# Patient Record
Sex: Male | Born: 1955 | Race: White | Hispanic: No | Marital: Married | Smoking: Former smoker
Health system: Southern US, Community
[De-identification: ages and names within clinical notes are randomized; demographics above are authoritative.]

## PROBLEM LIST (undated history)

## (undated) DIAGNOSIS — H409 Unspecified glaucoma: Secondary | ICD-10-CM

## (undated) DIAGNOSIS — E119 Type 2 diabetes mellitus without complications: Secondary | ICD-10-CM

## (undated) DIAGNOSIS — R202 Paresthesia of skin: Secondary | ICD-10-CM

## (undated) DIAGNOSIS — G4730 Sleep apnea, unspecified: Secondary | ICD-10-CM

## (undated) DIAGNOSIS — E785 Hyperlipidemia, unspecified: Secondary | ICD-10-CM

## (undated) DIAGNOSIS — K219 Gastro-esophageal reflux disease without esophagitis: Secondary | ICD-10-CM

## (undated) DIAGNOSIS — C3490 Malignant neoplasm of unspecified part of unspecified bronchus or lung: Secondary | ICD-10-CM

## (undated) DIAGNOSIS — M2550 Pain in unspecified joint: Secondary | ICD-10-CM

## (undated) DIAGNOSIS — R200 Anesthesia of skin: Secondary | ICD-10-CM

## (undated) DIAGNOSIS — G629 Polyneuropathy, unspecified: Secondary | ICD-10-CM

## (undated) DIAGNOSIS — N289 Disorder of kidney and ureter, unspecified: Secondary | ICD-10-CM

## (undated) DIAGNOSIS — R413 Other amnesia: Secondary | ICD-10-CM

## (undated) DIAGNOSIS — M549 Dorsalgia, unspecified: Secondary | ICD-10-CM

## (undated) DIAGNOSIS — M7989 Other specified soft tissue disorders: Secondary | ICD-10-CM

## (undated) DIAGNOSIS — F32A Depression, unspecified: Secondary | ICD-10-CM

## (undated) DIAGNOSIS — F419 Anxiety disorder, unspecified: Secondary | ICD-10-CM

## (undated) HISTORY — DX: Other amnesia: R41.3

## (undated) HISTORY — DX: Anesthesia of skin: R20.2

## (undated) HISTORY — PX: CARPAL TUNNEL RELEASE: SHX101

## (undated) HISTORY — DX: Depression, unspecified: F32.A

## (undated) HISTORY — DX: Hyperlipidemia, unspecified: E78.5

## (undated) HISTORY — DX: Sleep apnea, unspecified: G47.30

## (undated) HISTORY — DX: Type 2 diabetes mellitus without complications: E11.9

## (undated) HISTORY — DX: Anesthesia of skin: R20.0

## (undated) HISTORY — DX: Malignant neoplasm of unspecified part of unspecified bronchus or lung: C34.90

## (undated) HISTORY — DX: Polyneuropathy, unspecified: G62.9

## (undated) HISTORY — DX: Unspecified glaucoma: H40.9

## (undated) HISTORY — DX: Anxiety disorder, unspecified: F41.9

## (undated) HISTORY — DX: Dorsalgia, unspecified: M54.9

## (undated) HISTORY — DX: Disorder of kidney and ureter, unspecified: N28.9

## (undated) HISTORY — PX: TONSILLECTOMY: SUR1361

## (undated) HISTORY — DX: Other specified soft tissue disorders: M79.89

## (undated) HISTORY — DX: Gastro-esophageal reflux disease without esophagitis: K21.9

## (undated) HISTORY — DX: Pain in unspecified joint: M25.50

---

## 1999-01-09 ENCOUNTER — Emergency Department (HOSPITAL_COMMUNITY): Admission: EM | Admit: 1999-01-09 | Discharge: 1999-01-09 | Payer: Self-pay | Admitting: Emergency Medicine

## 1999-01-09 ENCOUNTER — Encounter: Payer: Self-pay | Admitting: Emergency Medicine

## 2003-03-21 ENCOUNTER — Emergency Department (HOSPITAL_COMMUNITY): Admission: EM | Admit: 2003-03-21 | Discharge: 2003-03-21 | Payer: Self-pay | Admitting: Emergency Medicine

## 2005-04-23 ENCOUNTER — Ambulatory Visit: Payer: Self-pay | Admitting: Family Medicine

## 2005-09-20 ENCOUNTER — Ambulatory Visit: Payer: Self-pay | Admitting: Family Medicine

## 2005-09-27 ENCOUNTER — Ambulatory Visit: Payer: Self-pay | Admitting: Family Medicine

## 2006-03-04 ENCOUNTER — Ambulatory Visit: Payer: Self-pay | Admitting: Family Medicine

## 2011-09-17 DIAGNOSIS — Z902 Acquired absence of lung [part of]: Secondary | ICD-10-CM

## 2011-09-17 HISTORY — PX: LUNG LOBECTOMY: SHX167

## 2011-09-17 HISTORY — DX: Acquired absence of lung (part of): Z90.2

## 2013-02-17 DIAGNOSIS — E119 Type 2 diabetes mellitus without complications: Secondary | ICD-10-CM | POA: Insufficient documentation

## 2013-02-17 DIAGNOSIS — E785 Hyperlipidemia, unspecified: Secondary | ICD-10-CM | POA: Insufficient documentation

## 2019-07-05 DIAGNOSIS — K30 Functional dyspepsia: Secondary | ICD-10-CM | POA: Insufficient documentation

## 2019-07-05 DIAGNOSIS — N183 Chronic kidney disease, stage 3 unspecified: Secondary | ICD-10-CM | POA: Insufficient documentation

## 2019-07-05 DIAGNOSIS — F320 Major depressive disorder, single episode, mild: Secondary | ICD-10-CM | POA: Insufficient documentation

## 2019-07-05 DIAGNOSIS — E1142 Type 2 diabetes mellitus with diabetic polyneuropathy: Secondary | ICD-10-CM | POA: Insufficient documentation

## 2019-07-05 DIAGNOSIS — E1121 Type 2 diabetes mellitus with diabetic nephropathy: Secondary | ICD-10-CM | POA: Insufficient documentation

## 2019-07-05 DIAGNOSIS — B002 Herpesviral gingivostomatitis and pharyngotonsillitis: Secondary | ICD-10-CM | POA: Insufficient documentation

## 2019-07-05 DIAGNOSIS — N529 Male erectile dysfunction, unspecified: Secondary | ICD-10-CM | POA: Insufficient documentation

## 2019-07-05 DIAGNOSIS — N401 Enlarged prostate with lower urinary tract symptoms: Secondary | ICD-10-CM | POA: Insufficient documentation

## 2019-09-17 DIAGNOSIS — G20A1 Parkinson's disease without dyskinesia, without mention of fluctuations: Secondary | ICD-10-CM

## 2019-09-17 HISTORY — DX: Parkinson's disease without dyskinesia, without mention of fluctuations: G20.A1

## 2019-11-12 LAB — HEMOGLOBIN A1C: Hemoglobin A1C: 6.7

## 2019-12-01 ENCOUNTER — Encounter: Payer: Self-pay | Admitting: Family Medicine

## 2019-12-01 ENCOUNTER — Other Ambulatory Visit: Payer: Self-pay

## 2019-12-01 ENCOUNTER — Ambulatory Visit (INDEPENDENT_AMBULATORY_CARE_PROVIDER_SITE_OTHER): Payer: 59 | Admitting: Family Medicine

## 2019-12-01 VITALS — BP 119/71 | HR 83 | Ht 67.0 in | Wt 235.0 lb

## 2019-12-01 DIAGNOSIS — E1142 Type 2 diabetes mellitus with diabetic polyneuropathy: Secondary | ICD-10-CM

## 2019-12-01 DIAGNOSIS — G3184 Mild cognitive impairment, so stated: Secondary | ICD-10-CM

## 2019-12-01 DIAGNOSIS — E1121 Type 2 diabetes mellitus with diabetic nephropathy: Secondary | ICD-10-CM | POA: Diagnosis not present

## 2019-12-01 DIAGNOSIS — N184 Chronic kidney disease, stage 4 (severe): Secondary | ICD-10-CM

## 2019-12-01 DIAGNOSIS — I10 Essential (primary) hypertension: Secondary | ICD-10-CM | POA: Insufficient documentation

## 2019-12-01 DIAGNOSIS — E1122 Type 2 diabetes mellitus with diabetic chronic kidney disease: Secondary | ICD-10-CM

## 2019-12-01 DIAGNOSIS — Z85118 Personal history of other malignant neoplasm of bronchus and lung: Secondary | ICD-10-CM

## 2019-12-01 DIAGNOSIS — C3491 Malignant neoplasm of unspecified part of right bronchus or lung: Secondary | ICD-10-CM

## 2019-12-01 DIAGNOSIS — Z794 Long term (current) use of insulin: Secondary | ICD-10-CM

## 2019-12-01 DIAGNOSIS — K219 Gastro-esophageal reflux disease without esophagitis: Secondary | ICD-10-CM | POA: Insufficient documentation

## 2019-12-01 DIAGNOSIS — E782 Mixed hyperlipidemia: Secondary | ICD-10-CM

## 2019-12-01 MED ORDER — NONFORMULARY OR COMPOUNDED ITEM: 0 refills | Status: DC

## 2019-12-01 NOTE — Patient Instructions (Addendum)
Great to meet you! Try using walker, especially if on uneven terrain.  Continue current medications.  I would like to see you back in about 3 months or sooner if needed.  Let me know if you are interested in 2nd opinion from a different neurologist.

## 2019-12-05 ENCOUNTER — Encounter: Payer: Self-pay | Admitting: Family Medicine

## 2019-12-05 DIAGNOSIS — C3490 Malignant neoplasm of unspecified part of unspecified bronchus or lung: Secondary | ICD-10-CM | POA: Insufficient documentation

## 2019-12-05 DIAGNOSIS — G3184 Mild cognitive impairment, so stated: Secondary | ICD-10-CM | POA: Insufficient documentation

## 2019-12-05 NOTE — Assessment & Plan Note (Signed)
Tolerating crestor and fenofibrate well.  Last LDL of 57.  Continue at current dose.

## 2019-12-05 NOTE — Progress Notes (Signed)
Randall Fisher - 64 y.o. male MRN [id number]  Date of birth: 06-Jan-1956  Subjective Chief Complaint  Patient presents with  . Establish Care    HPI Randall Fisher is a 64 y.o. male with history of HTN, T2DM, CAD, Non small cell lung cancer s/p lobectomy here today for initial visit.  Records reviewed as available through care everywhere.    -HTN/CAD:  Current medications for HTN include lisinopril, furosemide.  He is also prescribed ranexa for chronic anginal symptoms.  He had normal stress test previously.  He continues to follow with cardiology through [facility].  He denies any new or worsening symptoms including chest pain, shortness of breath, palpitations, headache or vision changes.  He is taking crestor and fenofibrate for management of lipids.  He is tolerating this well.    -NSCLC:  S/p lobectomy of RUL in 2013.  Continues to follow with pulmonology and oncology. He needs new referral to oncology due to insurance change.  He denies increased shortness of breath, cough or fatigue.   -T2DM:  Followed by endocrinology at [facility].  Last A1c of 6.7% on 11/12/2019.  Current medications include tresiba and trulicity.  He is doing well with these.  He denies episodes of hypoglycemia.   -Cognitive impairment:  He is being evaluated for neuro for cognitive impairment.  He and his wife had noted cognitive decline over the past year.  MRI from 07/2019 with a few small white matter lesions, favoring chronic microvascular disease.  He is currently taking aricept.  He has also had some frequent falls due to gait instability.  He blames this on knee pain but also feels off balance.  He does have neuropathy related to his diabetes as well.  He is considering seeing another neurologist for a 2nd opinion regarding what is going on.    ROS:  A comprehensive ROS was completed and negative except as noted per HPI  Allergies  Allergen Reactions  . Mushroom Extract Complex Swelling  . Shellfish Allergy Swelling     THROAT SWELLED/ HANDS SWELLED.    Past Medical History:  Diagnosis Date  . Diabetes mellitus without complication ([place])   . Hyperlipidemia   . Joint pain   . Leg swelling   . Numbness and tingling in both hands     Past Surgical History:  Procedure Laterality Date  . LUNG LOBECTOMY Right 2013    Social History   Socioeconomic History  . Marital status: Married    Spouse name: Not on file  . Number of children: Not on file  . Years of education: Not on file  . Highest education level: Not on file  Occupational History  . Not on file  Tobacco Use  . Smoking status: Never Smoker  . Smokeless tobacco: Never Used  Substance and Sexual Activity  . Alcohol use: Not Currently  . Drug use: Never  . Sexual activity: Yes    Partners: Female    Birth control/protection: Condom  Other Topics Concern  . Not on file  Social History Narrative  . Not on file   Social Determinants of Health   Financial Resource Strain:   . Difficulty of Paying Living Expenses:   Food Insecurity:   . Worried About Charity fundraiser in the Last Year:   . Arboriculturist in the Last Year:   Transportation Needs:   . Film/video editor (Medical):   Marland Kitchen Lack of Transportation (Non-Medical):   Physical Activity:   .  Days of Exercise per Week:   . Minutes of Exercise per Session:   Stress:   . Feeling of Stress :   Social Connections:   . Frequency of Communication with Friends and Family:   . Frequency of Social Gatherings with Friends and Family:   . Attends Religious Services:   . Active Member of Clubs or Organizations:   . Attends Archivist Meetings:   Marland Kitchen Marital Status:     Family History  Problem Relation Age of Onset  . Diabetes Mother   . Diabetes Father     Health Maintenance  Topic Date Due  . Hepatitis C Screening  11/30/2020 (Originally Dec 01, 1955)  . HEMOGLOBIN A1C  05/11/2020  . FOOT EXAM  09/16/2020  . OPHTHALMOLOGY EXAM  09/16/2020  . TETANUS/TDAP   07/16/2026  . COLONOSCOPY  08/17/2029  . INFLUENZA VACCINE  Completed  . PNEUMOCOCCAL POLYSACCHARIDE VACCINE AGE 46-64 HIGH RISK  Completed  . HIV Screening  Discontinued     ----------------------------------------------------------------------------------------------------------------------------------------------------------------------------------------------------------------- Physical Exam BP 119/71   Pulse 83   Ht 5\' 7"  (1.702 m)   Wt 235 lb (106.6 kg)   BMI 36.81 kg/m   Physical Exam Constitutional:      Appearance: Normal appearance.  HENT:     Head: Normocephalic and atraumatic.  Eyes:     General: No scleral icterus. Cardiovascular:     Rate and Rhythm: Normal rate and regular rhythm.  Pulmonary:     Effort: Pulmonary effort is normal.     Breath sounds: Normal breath sounds.  Skin:    General: Skin is warm and dry.  Neurological:     General: No focal deficit present.     Mental Status: He is alert.     Gait: Gait abnormal.  Psychiatric:        Mood and Affect: Mood normal.        Behavior: Behavior normal.     ------------------------------------------------------------------------------------------------------------------------------------------------------------------------------------------------------------------- Assessment and Plan  Hypertension Blood pressure is at goal at for age and co-morbidities.  I recommend that he continue current medications.  In addition they were instructed to follow a low sodium diet with regular exercise to help to maintain adequate control of blood pressure.    Diabetic peripheral neuropathy associated with type 2 diabetes mellitus ([place]) Currently managed with lyrica.  Having some gait instability which is likely multifactorial from his neuropathy and knee pain.  Rx for rolling walker provided.  Declines PT referral at this time.   Diabetic renal disease ([place]) Followed by nephrology, stable.    DM (diabetes  mellitus) ([place]) Per endo records this has been well controlled with recent a1c of 6.7%.  He will continue trulicity and tresiba  HLD (hyperlipidemia) Tolerating crestor and fenofibrate well.  Last LDL of 57.  Continue at current dose.    Non-small cell lung cancer ([facility]) HE is doing well at this time.  He'll continue to follow with pulmonology.  Updated referral entered to oncology.   Mild cognitive impairment Tolerating aricept well.  He'll continue to follow with neurology but may want 2nd opinion, he'll let me know.    Meds ordered this encounter  Medications  . NONFORMULARY OR COMPOUNDED ITEM    Sig: Please provide rolling walker.  Diagnosis: Gait instability    Dispense:  1 each    Refill:  0    Return in about 3 months (around 03/02/2020) for HTN/Cognitive impairment. .    This visit occurred during the SARS-CoV-2 public health emergency.  Safety protocols were in place, including screening questions prior to the visit, additional usage of staff PPE, and extensive cleaning of exam room while observing appropriate contact time as indicated for disinfecting solutions.

## 2019-12-05 NOTE — Assessment & Plan Note (Signed)
HE is doing well at this time.  He'll continue to follow with pulmonology.  Updated referral entered to oncology.

## 2019-12-05 NOTE — Assessment & Plan Note (Signed)
Followed by nephrology, stable.

## 2019-12-05 NOTE — Assessment & Plan Note (Signed)
Tolerating aricept well.  He'll continue to follow with neurology but may want 2nd opinion, he'll let me know.

## 2019-12-05 NOTE — Assessment & Plan Note (Signed)
Blood pressure is at goal at for age and co-morbidities.  I recommend that he continue current medications.  In addition they were instructed to follow a low sodium diet with regular exercise to help to maintain adequate control of blood pressure.

## 2019-12-05 NOTE — Assessment & Plan Note (Signed)
Per endo records this has been well controlled with recent a1c of 6.7%.  He will continue trulicity and tresiba

## 2019-12-05 NOTE — Assessment & Plan Note (Signed)
Currently managed with lyrica.  Having some gait instability which is likely multifactorial from his neuropathy and knee pain.  Rx for rolling walker provided.  Declines PT referral at this time.

## 2019-12-07 ENCOUNTER — Encounter: Payer: Self-pay | Admitting: Family Medicine

## 2019-12-07 ENCOUNTER — Telehealth (INDEPENDENT_AMBULATORY_CARE_PROVIDER_SITE_OTHER): Payer: 59 | Admitting: Family Medicine

## 2019-12-07 DIAGNOSIS — J440 Chronic obstructive pulmonary disease with acute lower respiratory infection: Secondary | ICD-10-CM

## 2019-12-07 DIAGNOSIS — J209 Acute bronchitis, unspecified: Secondary | ICD-10-CM | POA: Diagnosis not present

## 2019-12-07 MED ORDER — DONEPEZIL HCL 5 MG PO TABS: 5.0000 mg | ORAL_TABLET | Freq: Every day | ORAL | 1 refills | Status: DC

## 2019-12-07 MED ORDER — ALBUTEROL SULFATE (2.5 MG/3ML) 0.083% IN NEBU: 2.5000 mg | INHALATION_SOLUTION | Freq: Four times a day (QID) | RESPIRATORY_TRACT | 1 refills | Status: DC | PRN

## 2019-12-07 MED ORDER — PREDNISONE 20 MG PO TABS: 20.0000 mg | ORAL_TABLET | Freq: Two times a day (BID) | ORAL | 0 refills | Status: DC

## 2019-12-07 MED ORDER — LISINOPRIL 2.5 MG PO TABS: 2.5000 mg | ORAL_TABLET | Freq: Every day | ORAL | 1 refills | Status: DC

## 2019-12-07 MED ORDER — OMEPRAZOLE 20 MG PO CPDR: 20.0000 mg | DELAYED_RELEASE_CAPSULE | Freq: Every day | ORAL | 1 refills | Status: DC

## 2019-12-07 MED ORDER — FUROSEMIDE 20 MG PO TABS: 20.0000 mg | ORAL_TABLET | Freq: Every day | ORAL | 1 refills | Status: DC

## 2019-12-07 MED ORDER — TRULANCE 3 MG PO TABS: 1.0000 | ORAL_TABLET | Freq: Every day | ORAL | 1 refills | Status: DC

## 2019-12-07 MED ORDER — FENOFIBRATE 160 MG PO TABS: 160.0000 mg | ORAL_TABLET | Freq: Every day | ORAL | 1 refills | Status: DC

## 2019-12-07 MED ORDER — DOXYCYCLINE HYCLATE 100 MG PO TABS: 100.0000 mg | ORAL_TABLET | Freq: Two times a day (BID) | ORAL | 0 refills | Status: AC

## 2019-12-07 MED ORDER — ROSUVASTATIN CALCIUM 10 MG PO TABS: 10.0000 mg | ORAL_TABLET | Freq: Every day | ORAL | 1 refills | Status: DC

## 2019-12-07 NOTE — Progress Notes (Signed)
Patient thinks he has bronchitis and is not feeling well.   He reports no fever or sore throat.   He reports the symptoms started Sunday night. He reports that he was just not feeling well.   He reports brown to green in color that coming up. He reports some shortness of breath but no more than normal  He has only taken Mucinex and his nebulizer. The nebulizer helps some.  He wants a refill on his Albuterol solution.

## 2019-12-07 NOTE — Assessment & Plan Note (Signed)
Declines COVID testing at this time.  Discussed that if symptoms are worsening I would strongly recommend that he have this completed.  Start doxycycline and prednisone burst.  Albuterol neb solution refilled.  Instructed to call office for any new or worsening symptoms.   He should have COVID vaccine once this is resolved.

## 2019-12-07 NOTE — Progress Notes (Signed)
Randall Fisher - 64 y.o. male MRN [id number]  Date of birth: July 20, 1956   This visit type was conducted due to national recommendations for restrictions regarding the COVID-19 Pandemic (e.g. social distancing).  This format is felt to be most appropriate for this patient at this time.  All issues noted in this document were discussed and addressed.  No physical exam was performed (except for noted visual exam findings with Video Visits).  I discussed the limitations of evaluation and management by telemedicine and the availability of in person appointments. The patient expressed understanding and agreed to proceed.  I connected with@ on 12/07/19 at  8:30 AM EDT by a video enabled telemedicine application and verified that I am speaking with the correct person using two identifiers.  Present at visit: [name], DO [name]   Patient Location: Home [address] [place] [address]   Provider location:   [facility]  Chief Complaint  Patient presents with  . Nasal Congestion  . Cough    HPI  Randall Fisher is a 64 y.o. male who presents via audio/video conferencing for a telehealth visit today.  He has complaint of cough with nasal congestion.  Cough is productive for green/yellow phlegm.  He has had some mild wheezing as well.   He reports that symptoms started about 2 days ago.  States this feels like typical bronchitis that he gets annually.  He does have history of lung cancer and mild COPD.  He denies increased shortness of breath from baseline.  He denies fever, chills, body aches, changes to taste or smell. He has not had COVID vaccine yet.    ROS:  A comprehensive ROS was completed and negative except as noted per HPI  Past Medical History:  Diagnosis Date  . Diabetes mellitus without complication ([place])   . Hyperlipidemia   . Joint pain   . Leg swelling   . Numbness and tingling in both hands   . S/P lobectomy of lung 2013   RUL for NSCLC    Past  Surgical History:  Procedure Laterality Date  . LUNG LOBECTOMY Right 2013    Family History  Problem Relation Age of Onset  . Diabetes Mother   . Diabetes Father     Social History   Socioeconomic History  . Marital status: Married    Spouse name: Not on file  . Number of children: Not on file  . Years of education: Not on file  . Highest education level: Not on file  Occupational History  . Not on file  Tobacco Use  . Smoking status: Never Smoker  . Smokeless tobacco: Never Used  Substance and Sexual Activity  . Alcohol use: Not Currently  . Drug use: Never  . Sexual activity: Yes    Partners: Female    Birth control/protection: Condom  Other Topics Concern  . Not on file  Social History Narrative  . Not on file   Social Determinants of Health   Financial Resource Strain:   . Difficulty of Paying Living Expenses:   Food Insecurity:   . Worried About Charity fundraiser in the Last Year:   . Arboriculturist in the Last Year:   Transportation Needs:   . Film/video editor (Medical):   Marland Kitchen Lack of Transportation (Non-Medical):   Physical Activity:   . Days of Exercise per Week:   . Minutes of Exercise per Session:   Stress:   . Feeling of Stress :  Social Connections:   . Frequency of Communication with Friends and Family:   . Frequency of Social Gatherings with Friends and Family:   . Attends Religious Services:   . Active Member of Clubs or Organizations:   . Attends Archivist Meetings:   Marland Kitchen Marital Status:   Intimate Partner Violence:   . Fear of Current or Ex-Partner:   . Emotionally Abused:   Marland Kitchen Physically Abused:   . Sexually Abused:      Current Outpatient Medications:  .  aspirin 81 MG chewable tablet, Chew by mouth., Disp: , Rfl:  .  donepezil (ARICEPT) 5 MG tablet, Take 1 tablet (5 mg total) by mouth at bedtime., Disp: 90 tablet, Rfl: 1 .  Dulaglutide (TRULICITY) 1.5 HD/6.2IW SOPN, INJECT 1.5 MG(0.5ML) UNDER THE SKIN ONCE A  WEEK., Disp: , Rfl:  .  fenofibrate 160 MG tablet, Take 1 tablet (160 mg total) by mouth daily., Disp: 90 tablet, Rfl: 1 .  furosemide (LASIX) 20 MG tablet, Take 1 tablet (20 mg total) by mouth daily., Disp: 90 tablet, Rfl: 1 .  insulin degludec (TRESIBA FLEXTOUCH) 200 UNIT/ML FlexTouch Pen, Inject into the skin., Disp: , Rfl:  .  Insulin Pen Needle (BD PEN NEEDLE NANO U/F) 32G X 4 MM MISC, USE WITH LEVEMIR FLEXTOUCH DAILY, Disp: , Rfl:  .  lisinopril (ZESTRIL) 2.5 MG tablet, Take 1 tablet (2.5 mg total) by mouth daily., Disp: 90 tablet, Rfl: 1 .  magnesium oxide (MAG-OX) 400 MG tablet, Take by mouth., Disp: , Rfl:  .  Misc. Devices MISC, DX: Severe sleep apnea Start bipap 16/12 cm. Water with mask and supplies, Disp: , Rfl:  .  Multiple Vitamins-Minerals (MULTIVITAMIN ADULT, MINERALS, PO), Take 1 tablet by mouth., Disp: , Rfl:  .  NONFORMULARY OR COMPOUNDED ITEM, Please provide rolling walker.  Diagnosis: Gait instability, Disp: 1 each, Rfl: 0 .  nystatin-triamcinolone (MYCOLOG II) cream, Apply topically., Disp: , Rfl:  .  omeprazole (PRILOSEC) 20 MG capsule, Take 1 capsule (20 mg total) by mouth daily., Disp: 90 capsule, Rfl: 1 .  Plecanatide (TRULANCE) 3 MG TABS, Take 1 tablet by mouth daily., Disp: 90 tablet, Rfl: 1 .  pregabalin (LYRICA) 200 MG capsule, Take by mouth., Disp: , Rfl:  .  Probiotic Product (PROBIOTIC PO), Take 1 tablet by mouth daily., Disp: , Rfl:  .  ranolazine (RANEXA) 500 MG 12 hr tablet, Take by mouth., Disp: , Rfl:  .  rosuvastatin (CRESTOR) 10 MG tablet, Take 1 tablet (10 mg total) by mouth daily., Disp: 90 tablet, Rfl: 1 .  tadalafil (CIALIS) 20 MG tablet, Take by mouth., Disp: , Rfl:  .  vardenafil (LEVITRA) 20 MG tablet, Take 20 mg by mouth daily as needed for erectile dysfunction., Disp: , Rfl:  .  albuterol (PROVENTIL) (2.5 MG/3ML) 0.083% nebulizer solution, Take 3 mLs (2.5 mg total) by nebulization every 6 (six) hours as needed for wheezing or shortness of breath.,  Disp: 150 mL, Rfl: 1 .  doxycycline (VIBRA-TABS) 100 MG tablet, Take 1 tablet (100 mg total) by mouth 2 (two) times daily for 10 days., Disp: 20 tablet, Rfl: 0 .  predniSONE (DELTASONE) 20 MG tablet, Take 1 tablet (20 mg total) by mouth 2 (two) times daily with a meal for 5 days., Disp: 10 tablet, Rfl: 0  EXAM:  VITALS per patient if applicable: There were no vitals taken for this visit.  GENERAL: alert, oriented, appears well and in no acute distress  HEENT: atraumatic, conjunttiva clear,  no obvious abnormalities on inspection of external nose and ears  NECK: normal movements of the head and neck  LUNGS: on inspection no signs of respiratory distress, breathing rate appears normal, no obvious gross SOB, gasping or wheezing  CV: no obvious cyanosis  MS: moves all visible extremities without noticeable abnormality  PSYCH/NEURO: pleasant and cooperative, no obvious depression or anxiety, speech and thought processing grossly intact  ASSESSMENT AND PLAN:  Discussed the following assessment and plan:  Acute bronchitis with COPD ([place]) Declines COVID testing at this time.  Discussed that if symptoms are worsening I would strongly recommend that he have this completed.  Start doxycycline and prednisone burst.  Albuterol neb solution refilled.  Instructed to call office for any new or worsening symptoms.   He should have COVID vaccine once this is resolved.    Meds ordered this encounter  Medications  . rosuvastatin (CRESTOR) 10 MG tablet    Sig: Take 1 tablet (10 mg total) by mouth daily.    Dispense:  90 tablet    Refill:  1  . furosemide (LASIX) 20 MG tablet    Sig: Take 1 tablet (20 mg total) by mouth daily.    Dispense:  90 tablet    Refill:  1  . lisinopril (ZESTRIL) 2.5 MG tablet    Sig: Take 1 tablet (2.5 mg total) by mouth daily.    Dispense:  90 tablet    Refill:  1  . Plecanatide (TRULANCE) 3 MG TABS    Sig: Take 1 tablet by mouth daily.    Dispense:  90 tablet     Refill:  1  . fenofibrate 160 MG tablet    Sig: Take 1 tablet (160 mg total) by mouth daily.    Dispense:  90 tablet    Refill:  1  . omeprazole (PRILOSEC) 20 MG capsule    Sig: Take 1 capsule (20 mg total) by mouth daily.    Dispense:  90 capsule    Refill:  1  . donepezil (ARICEPT) 5 MG tablet    Sig: Take 1 tablet (5 mg total) by mouth at bedtime.    Dispense:  90 tablet    Refill:  1  . predniSONE (DELTASONE) 20 MG tablet    Sig: Take 1 tablet (20 mg total) by mouth 2 (two) times daily with a meal for 5 days.    Dispense:  10 tablet    Refill:  0  . albuterol (PROVENTIL) (2.5 MG/3ML) 0.083% nebulizer solution    Sig: Take 3 mLs (2.5 mg total) by nebulization every 6 (six) hours as needed for wheezing or shortness of breath.    Dispense:  150 mL    Refill:  1  . doxycycline (VIBRA-TABS) 100 MG tablet    Sig: Take 1 tablet (100 mg total) by mouth 2 (two) times daily for 10 days.    Dispense:  20 tablet    Refill:  0   30 minutes spent including pre visit preparation, review of prior notes and labs, encounter with patient via video visit and same day documentation.     I discussed the assessment and treatment plan with the patient. The patient was provided an opportunity to ask questions and all were answered. The patient agreed with the plan and demonstrated an understanding of the instructions.   The patient was advised to call back or seek an in-person evaluation if the symptoms worsen or if the condition fails to improve as anticipated.  [name], DO

## 2019-12-09 ENCOUNTER — Telehealth: Payer: Self-pay | Admitting: Family Medicine

## 2019-12-09 NOTE — Telephone Encounter (Signed)
Received fax for PA on Trulance sent through cover my meds waiting on determination. - CF

## 2019-12-09 NOTE — Telephone Encounter (Signed)
Received fax from [employer] and they denied coverage on Trulance. Placing in providers box for review. - CF

## 2019-12-16 ENCOUNTER — Other Ambulatory Visit: Payer: Self-pay | Admitting: Family Medicine

## 2019-12-16 ENCOUNTER — Other Ambulatory Visit: Payer: Self-pay

## 2019-12-16 MED ORDER — TADALAFIL 20 MG PO TABS: ORAL_TABLET | ORAL | 1 refills | Status: DC

## 2019-12-16 NOTE — Telephone Encounter (Signed)
Patient states he has only ever taken Cialis.  Will contact cardiology for other refill

## 2019-12-16 NOTE — Telephone Encounter (Signed)
Ranolazine is managed by cardiology.  He will need to contact them for refill of this He has both tadalafil and vardenafil on list with vardenafil being last medication filled.  He should not be taking both of these.  Please clarify what he is taking.

## 2019-12-16 NOTE — Telephone Encounter (Addendum)
Patient called and was upset because he states he has called Dr Zigmund Daniel assistant a couple times requesting medications be sent to pharmacy but it was not done. I do not see any phone notes for this.   [name] states that patient is needing   -Ranolazine 500 mg 1 BID -Cialis 20 mg  Wants these to go to mail order.   RX pended, please review.

## 2019-12-21 ENCOUNTER — Telehealth: Payer: Self-pay | Admitting: Family Medicine

## 2019-12-21 NOTE — Telephone Encounter (Signed)
Received fax for PA on Tadalafil sent through cover my meds waiting on determination. - CF

## 2019-12-23 ENCOUNTER — Inpatient Hospital Stay: Payer: 59 | Attending: Hematology & Oncology | Admitting: Hematology & Oncology

## 2019-12-23 ENCOUNTER — Inpatient Hospital Stay: Payer: 59

## 2019-12-29 ENCOUNTER — Telehealth: Payer: Self-pay | Admitting: Family Medicine

## 2019-12-29 NOTE — Telephone Encounter (Signed)
Patient called concerning this medication. States that insurance sent him a letter stating that Sildenafil was a covered alternative. Patient tried and failed Sildenafil in 2002.  Please call insurance and let them know this additional information.   Thanks! (insurance number [phone])  Please call patient with update.

## 2019-12-29 NOTE — Telephone Encounter (Signed)
I called the patient and he states that he has talked to Apolonio Schneiders about his medication and some were denied by insurance and she is going to take care of this matter. No other concerns. PCP is aware.

## 2019-12-29 NOTE — Telephone Encounter (Signed)
Per patient, he spoke with insurance and they stated that this request was denied because they did not hear back from our office in the deadline time.  Per patient, we can call his insurance at [id number] to get this approved. Jenny Reichmann- can you please do this and follow up with patient?matt   (Note patient has another note in his chart about a PA for another medication)

## 2020-01-05 ENCOUNTER — Other Ambulatory Visit: Payer: Self-pay | Admitting: Family Medicine

## 2020-01-05 MED ORDER — SILDENAFIL CITRATE 100 MG PO TABS: 50.0000 mg | ORAL_TABLET | Freq: Every day | ORAL | 1 refills | Status: DC | PRN

## 2020-01-05 NOTE — Telephone Encounter (Signed)
Patient is agreeable to take the Sildenafil in the place of the [facility] since it was not covered. Please send to mail order please.

## 2020-01-05 NOTE — Telephone Encounter (Signed)
Rx for sildenafil sent to [facility].

## 2020-01-05 NOTE — Telephone Encounter (Signed)
Received a call from [facility] at [employer] order that the pharmacy was still waiting on the chart notes from where the patient had tried and failed Sildenafil and if we do not have those chart notes then we are not able to fill the Tadalafil. I advised her that we have not received all the records and are waiting on some to be sent over so we can prove medically necessary for this patient to be on Tadalafil. She will make a note and send this over to the pharmacy.

## 2020-01-06 NOTE — Telephone Encounter (Signed)
Patient had been advised that PCP was sending in a new medication.  No other questions or concerns.

## 2020-01-06 NOTE — Telephone Encounter (Signed)
Received fax from [facility] and they approved Tadalafil 20 mg from 01/05/2020- 01/04/2021. - CF

## 2020-01-07 ENCOUNTER — Other Ambulatory Visit: Payer: Self-pay | Admitting: Family Medicine

## 2020-01-07 MED ORDER — TADALAFIL 20 MG PO TABS: 10.0000 mg | ORAL_TABLET | ORAL | 3 refills | Status: DC | PRN

## 2020-02-11 ENCOUNTER — Encounter: Payer: Self-pay | Admitting: Nurse Practitioner

## 2020-02-11 ENCOUNTER — Telehealth (INDEPENDENT_AMBULATORY_CARE_PROVIDER_SITE_OTHER): Payer: 59 | Admitting: Nurse Practitioner

## 2020-02-11 DIAGNOSIS — J329 Chronic sinusitis, unspecified: Secondary | ICD-10-CM | POA: Diagnosis not present

## 2020-02-11 DIAGNOSIS — J40 Bronchitis, not specified as acute or chronic: Secondary | ICD-10-CM | POA: Diagnosis not present

## 2020-02-11 MED ORDER — PREDNISONE 20 MG PO TABS: 20.0000 mg | ORAL_TABLET | Freq: Two times a day (BID) | ORAL | 0 refills | Status: AC

## 2020-02-11 MED ORDER — ALBUTEROL SULFATE (2.5 MG/3ML) 0.083% IN NEBU: 2.5000 mg | INHALATION_SOLUTION | Freq: Four times a day (QID) | RESPIRATORY_TRACT | 1 refills | Status: DC | PRN

## 2020-02-11 MED ORDER — AMOXICILLIN-POT CLAVULANATE 875-125 MG PO TABS: 1.0000 | ORAL_TABLET | Freq: Two times a day (BID) | ORAL | 0 refills | Status: DC

## 2020-02-11 NOTE — Progress Notes (Addendum)
Virtual Visit via MyChart Note- VIDEO VISIT  I connected with  [name] on 02/11/20 at  2:10 PM EDT by the video enabled telemedicine application, MyChart, and verified that I am speaking with the correct person using two identifiers.   I introduced myself as a Designer, jewellery with the practice. We discussed the limitations of evaluation and management by telemedicine and the availability of in person appointments. The patient expressed understanding and agreed to proceed.  The patient is: at home I am: in the office  Subjective:    CC: bronchitis  HPI: Randall Fisher is a 64 y.o. y/o male presenting via [place] today for onset of productive cough, sinus drainage and pressure, rhinorrhea, and mild shortness of breath with coughing that started about a week ago while babysitting their grandchild, who was sick with a cough. He denies fever, chills, chest pain, or ear pain/pressure.  He was treated with prednisone, albuterol nebulizer, and doxycycline approximately one month ago for similar symptoms. He reports he is a former smoker and battles with bronchitis a few times a year for which he has to receive treatment.  He reports his wife is also sick with the same symptoms, but she is a little worse and has been experiencing a fever. He would like to avoid his symptoms getting worse.   He has been taking mucinex with little relief of symptoms.   Past medical history, Surgical history, Family history not pertinant except as noted below, Social history, Allergies, and medications have been entered into the medical record, reviewed, and corrections made.   Review of Systems:  See HPI  Objective:    General: Speaking clearly in complete sentences without any shortness of breath.  Alert and oriented x3.  Normal judgment. No apparent acute distress. He sounds mildly congested and a course cough is present.   Impression and Recommendations:    1. Sinobronchitis Symptoms and  presentation consistent with sinobronchitis. He does have a history of bronchitis and given the length of his symptoms and the symptoms of his family, I feel that antibiotic and steroid therapy are warranted to prevent worsening into pneumonia.   PLAN: -Albuterol nebulizer every 6 hours as needed for wheezing, cough, and shortness of breath. -Augmentin twice a day for 5 days. -Prednisone 20mg  twice a day for 5 days. -Continue with mucinex, tylenol, and ibuprofen for symptoms. -Follow-up if symptoms worsen or fail to improve.  - albuterol (PROVENTIL) (2.5 MG/3ML) 0.083% nebulizer solution; Take 3 mLs (2.5 mg total) by nebulization every 6 (six) hours as needed for wheezing or shortness of breath.  Dispense: 150 mL; Refill: 1 - predniSONE (DELTASONE) 20 MG tablet; Take 1 tablet (20 mg total) by mouth 2 (two) times daily with a meal for 5 days.  Dispense: 10 tablet; Refill: 0 - amoxicillin-clavulanate (AUGMENTIN) 875-125 MG tablet; Take 1 tablet by mouth 2 (two) times daily.  Dispense: 10 tablet; Refill: 0    I discussed the assessment and treatment plan with the patient. The patient was provided an opportunity to ask questions and all were answered. The patient agreed with the plan and demonstrated an understanding of the instructions.   The patient was advised to call back or seek an in-person evaluation if the symptoms worsen or if the condition fails to improve as anticipated.  I provided 8 minutes of non-face-to-face interaction with this [name] visit.   Orma Render, NP

## 2020-02-11 NOTE — Patient Instructions (Signed)
Acute Bronchitis, Adult  Acute bronchitis is sudden or acute swelling of the air tubes (bronchi) in the lungs. Acute bronchitis causes these tubes to fill with mucus, which can make it hard to breathe. It can also cause coughing or wheezing. In adults, acute bronchitis usually goes away within 2 weeks. A cough caused by bronchitis may last up to 3 weeks. Smoking, allergies, and asthma can make the condition worse. What are the causes? This condition can be caused by germs and by substances that irritate the lungs, including:  Cold and flu viruses. The most common cause of this condition is the virus that causes the common cold.  Bacteria.  Substances that irritate the lungs, including: ? Smoke from cigarettes and other forms of tobacco. ? Dust and pollen. ? Fumes from chemical products, gases, or burned fuel. ? Other materials that pollute indoor or outdoor air.  Close contact with someone who has acute bronchitis. What increases the risk? The following factors may make you more likely to develop this condition:  A weak body's defense system, also called the immune system.  A condition that affects your lungs and breathing, such as asthma. What are the signs or symptoms? Common symptoms of this condition include:  Lung and breathing problems, such as: ? Coughing. This may bring up clear, yellow, or green mucus from your lungs (sputum). ? Wheezing. ? Having too much mucus in your lungs (chest congestion). ? Having shortness of breath.  A fever.  Chills.  Aches and pains, including: ? Tightness in your chest and other body aches. ? A sore throat. How is this diagnosed? This condition is usually diagnosed based on:  Your symptoms and medical history.  A physical exam. You may also have other tests, including tests to rule out other conditions, such pneumonia. These tests include:  A test of lung function.  Test of a mucus sample to look for the presence of  bacteria.  Tests to check the oxygen level in your blood.  Blood tests.  Chest X-ray. How is this treated? Most cases of acute bronchitis clear up over time without treatment. Your health care provider may recommend:  Drinking more fluids. This can thin your mucus, which may improve your breathing.  Taking a medicine for a fever or cough.  Using a device that gets medicine into your lungs (inhaler) to help improve breathing and control coughing.  Using a vaporizer or a humidifier. These are machines that add water to the air to help you breathe better. Follow these instructions at home: Activity  Get plenty of rest.  Return to your normal activities as told by your health care provider. Ask your health care provider what activities are safe for you. Lifestyle  Drink enough fluid to keep your urine pale yellow.  Do not drink alcohol.  Do not use any products that contain nicotine or tobacco, such as cigarettes, e-cigarettes, and chewing tobacco. If you need help quitting, ask your health care provider. Be aware that: ? Your bronchitis will get worse if you smoke or breathe in other people's smoke (secondhand smoke). ? Your lungs will heal faster if you quit smoking. General instructions   Take over-the-counter and prescription medicines only as told by your health care provider.  Use an inhaler, vaporizer, or humidifier as told by your health care provider.  If you have a sore throat, gargle with a salt-water mixture 3-4 times a day or as needed. To make a salt-water mixture, completely dissolve -1 tsp (3-6  g) of salt in 1 cup (237 mL) of warm water.  Keep all follow-up visits as told by your health care provider. This is important. How is this prevented? To lower your risk of getting this condition again:  Wash your hands often with soap and water. If soap and water are not available, use hand sanitizer.  Avoid contact with people who have cold symptoms.  Try not to  touch your mouth, nose, or eyes with your hands.  Avoid places where there are fumes from chemicals. Breathing these fumes will make your condition worse.  Get the flu shot every year. Contact a health care provider if:  Your symptoms do not improve after 2 weeks of treatment.  You vomit more than once or twice.  You have symptoms of dehydration such as: ? Dark urine. ? Dry skin or eyes. ? Increased thirst. ? Headaches. ? Confusion. ? Muscle cramps. Get help right away if you:  Cough up blood.  Feel pain in your chest.  Have severe shortness of breath.  Faint or keep feeling like you are going to faint.  Have a severe headache.  Have fever or chills that get worse. These symptoms may represent a serious problem that is an emergency. Do not wait to see if the symptoms will go away. Get medical help right away. Call your local emergency services (911 in the [place] Do not drive yourself to the hospital. Summary  Acute bronchitis is sudden (acute) inflammation of the air tubes (bronchi) between the windpipe and the lungs. In adults, acute bronchitis usually goes away within 2 weeks, although coughing may last 3 weeks or longer  Take over-the-counter and prescription medicines only as told by your health care provider.  Drink enough fluid to keep your urine pale yellow.  Contact a health care provider if your symptoms do not improve after 2 weeks of treatment.  Get help right away if you cough up blood, faint, or have chest pain or shortness of breath. This information is not intended to replace advice given to you by your health care provider. Make sure you discuss any questions you have with your health care provider. Document Revised: 05/17/2019 Document Reviewed: 03/26/2019 Elsevier Patient Education  [place].

## 2020-02-18 ENCOUNTER — Telehealth (INDEPENDENT_AMBULATORY_CARE_PROVIDER_SITE_OTHER): Payer: 59 | Admitting: Nurse Practitioner

## 2020-02-18 ENCOUNTER — Encounter: Payer: Self-pay | Admitting: Nurse Practitioner

## 2020-02-18 DIAGNOSIS — J329 Chronic sinusitis, unspecified: Secondary | ICD-10-CM | POA: Diagnosis not present

## 2020-02-18 DIAGNOSIS — J40 Bronchitis, not specified as acute or chronic: Secondary | ICD-10-CM | POA: Diagnosis not present

## 2020-02-18 MED ORDER — BUDESONIDE 0.5 MG/2ML IN SUSP: 0.5000 mg | Freq: Two times a day (BID) | RESPIRATORY_TRACT | 0 refills | Status: DC

## 2020-02-18 MED ORDER — LEVOFLOXACIN 750 MG PO TABS: 750.0000 mg | ORAL_TABLET | Freq: Every day | ORAL | 0 refills | Status: DC

## 2020-02-18 MED ORDER — PREDNISONE 20 MG PO TABS: 20.0000 mg | ORAL_TABLET | Freq: Two times a day (BID) | ORAL | 0 refills | Status: DC

## 2020-02-18 NOTE — Progress Notes (Signed)
Virtual Visit via MyChart Note  I connected with  Randall Fisher on 02/18/20 at  2:10 PM EDT by the video enabled telemedicine application, MyChart, and verified that I am speaking with the correct person using two identifiers.   I introduced myself as a Designer, jewellery with the practice. We discussed the limitations of evaluation and management by telemedicine and the availability of in person appointments. The patient expressed understanding and agreed to proceed.  The patient is: at home I am: in the office  Subjective:    CC: Continued respiratory infection   HPI: Randall Fisher is a 64 y.o. y/o male presenting via [place] today for ongoing symptoms of upper respiratory infection. He was seen and treated 02/09/20 for sinobronchitis and prescribed augmentin, prednisone burst, and albuterol nebulizer treatments. He reports he has finished the antibiotic and the prednisone, but is continuing to use the nebulizer. He reports he is still having significant chest and sinus congestion, difficulty clearing his airway, and shortness of breath. He reports that his back hurts from coughing so much. He denies dizziness, chest pain, lower extremity edema, current fever, loss of taste or smell, exposure to COVID-19,or decreased oxygen saturations.   Past medical history, Surgical history, Family history not pertinant except as noted below, Social history, Allergies, and medications have been entered into the medical record, reviewed, and corrections made.   Review of Systems:  See HPI  Objective:    General: Speaking clearly in complete sentences. He is coughing frequently. He was finishing an albuterol nebulizer treatment when the visit started. His coloration appears normal and he does not appear to be in any acute distress at this time. He is alert and oriented x3 with normal judgment.   Impression and Recommendations:    1. Sinobronchitis Suspected continued infection with sinobronchitis in  the setting of underlying COPD and history of non-small cell lung cancer. I do feel that stronger antibiotics are necessary at this time given his history. It is possible that his infection has turned to pneumonia, but given his symptoms I would like to avoid bringing him into the office for a chest x-ray at this time.   PLAN: Treat with 7 days of levofloxacin. Add budesonide nebulizer twice a day Continue albuterol nebulizer every 4-6 hours as needed. Add second prednisone burst to regimen.  If symptoms worsen, fail to improve, or if you begin to feel that you cannot catch your breath, feel dizzy, have confusion, or your oxygen saturations drop below 90%, please go to the emergency room for evaluation.  Please let us know if you continue to have symptoms on Monday.  - levofloxacin (LEVAQUIN) 750 MG tablet; Take 1 tablet (750 mg total) by mouth daily.  Dispense: 7 tablet; Refill: 0 - budesonide (PULMICORT) 0.5 MG/2ML nebulizer solution; Take 2 mLs (0.5 mg total) by nebulization 2 (two) times daily.  Dispense: 120 mL; Refill: 0 - predniSONE (DELTASONE) 20 MG tablet; Take 1 tablet (20 mg total) by mouth 2 (two) times daily with a meal.  Dispense: 10 tablet; Refill: 0    I discussed the assessment and treatment plan with the patient. The patient was provided an opportunity to ask questions and all were answered. The patient agreed with the plan and demonstrated an understanding of the instructions.   The patient was advised to call back or seek an in-person evaluation if the symptoms worsen or if the condition fails to improve as anticipated.  I provided 12 minutes of non-face-to-face interaction with this  [place] visit.   Orma Render, NP

## 2020-02-23 ENCOUNTER — Encounter: Payer: Self-pay | Admitting: Nurse Practitioner

## 2020-02-24 ENCOUNTER — Ambulatory Visit (INDEPENDENT_AMBULATORY_CARE_PROVIDER_SITE_OTHER): Payer: 59

## 2020-02-24 ENCOUNTER — Encounter: Payer: Self-pay | Admitting: Family Medicine

## 2020-02-24 ENCOUNTER — Other Ambulatory Visit: Payer: Self-pay

## 2020-02-24 ENCOUNTER — Telehealth (INDEPENDENT_AMBULATORY_CARE_PROVIDER_SITE_OTHER): Payer: 59 | Admitting: Family Medicine

## 2020-02-24 VITALS — BP 151/86 | HR 85 | Temp 97.8°F | Ht 66.93 in | Wt 235.0 lb

## 2020-02-24 DIAGNOSIS — R059 Cough, unspecified: Secondary | ICD-10-CM

## 2020-02-24 DIAGNOSIS — G4733 Obstructive sleep apnea (adult) (pediatric): Secondary | ICD-10-CM | POA: Insufficient documentation

## 2020-02-24 DIAGNOSIS — R05 Cough: Secondary | ICD-10-CM

## 2020-02-24 DIAGNOSIS — J329 Chronic sinusitis, unspecified: Secondary | ICD-10-CM | POA: Insufficient documentation

## 2020-02-24 DIAGNOSIS — J40 Bronchitis, not specified as acute or chronic: Secondary | ICD-10-CM

## 2020-02-24 NOTE — Assessment & Plan Note (Signed)
Will obtain chest xray as I would have expected improvement with augmentin and levaquin with steroid burst x2. Symptoms do not seem suggestive of CHF If CXR negative will plant to see him in office for evaluation and check D-Dimer as well with history of malignancy.

## 2020-02-24 NOTE — Progress Notes (Signed)
Cough x 2 weeks. Worsening.  SOB  Stopped lisinopril due to Nephrology stating BP too low. 100/76

## 2020-02-24 NOTE — Progress Notes (Signed)
Randall Fisher - 64 y.o. male MRN [id number]  Date of birth: 1955-12-29   This visit type was conducted due to national recommendations for restrictions regarding the COVID-19 Pandemic (e.g. social distancing).  This format is felt to be most appropriate for this patient at this time.  All issues noted in this document were discussed and addressed.  No physical exam was performed (except for noted visual exam findings with Video Visits).  I discussed the limitations of evaluation and management by telemedicine and the availability of in person appointments. The patient expressed understanding and agreed to proceed.  I connected with@ on 02/24/20 at  8:50 AM EDT by a video enabled telemedicine application and verified that I am speaking with the correct person using two identifiers.  Present at visit: Randall Nutting, DO [name]   Patient Location: Home [address] [place] [id number]   Provider location:   [place]  No chief complaint on file.   HPI  THEODORO KOVAL is a 64 y.o. male who presents via audio/video conferencing for a telehealth visit today.  He is following up for continued cough and dyspnea.   He has history of NSCLC s/p R upper lobectomy. He was initially seen by Emeterio Reeve on 5/28.  Started on augmentin bid x5 days and prednisone 20mg  bid x5 days.  He did not have a whole lot of improvement with this and was seen again on 02/18/20.  Levaquin and pulmicort nebs were added at this visit as well as repeat course of prednisone.  Today he reports that he still has continued congestion.  Feels like he can't get the mucus up when coughing.  He does continue to have some mild shortness of breath.   He denies chest pain but does have some pain in his back with coughing.  He has not had increased LE swelling or significant weight change.      ROS:  A comprehensive ROS was completed and negative except as noted per HPI  Past Medical History:  Diagnosis Date  . Diabetes  mellitus without complication ([place])   . Hyperlipidemia   . Joint pain   . Leg swelling   . Numbness and tingling in both hands   . S/P lobectomy of lung 2013   RUL for NSCLC    Past Surgical History:  Procedure Laterality Date  . LUNG LOBECTOMY Right 2013    Family History  Problem Relation Age of Onset  . Diabetes Mother   . Diabetes Father     Social History   Socioeconomic History  . Marital status: Married    Spouse name: Not on file  . Number of children: Not on file  . Years of education: Not on file  . Highest education level: Not on file  Occupational History  . Not on file  Tobacco Use  . Smoking status: Never Smoker  . Smokeless tobacco: Never Used  Substance and Sexual Activity  . Alcohol use: Not Currently  . Drug use: Never  . Sexual activity: Yes    Partners: Female    Birth control/protection: Condom  Other Topics Concern  . Not on file  Social History Narrative  . Not on file   Social Determinants of Health   Financial Resource Strain:   . Difficulty of Paying Living Expenses:   Food Insecurity:   . Worried About Charity fundraiser in the Last Year:   . [place] in the Last Year:  Transportation Needs:   . Film/video editor (Medical):   Marland Kitchen Lack of Transportation (Non-Medical):   Physical Activity:   . Days of Exercise per Week:   . Minutes of Exercise per Session:   Stress:   . Feeling of Stress :   Social Connections:   . Frequency of Communication with Friends and Family:   . Frequency of Social Gatherings with Friends and Family:   . Attends Religious Services:   . Active Member of Clubs or Organizations:   . Attends Archivist Meetings:   Marland Kitchen Marital Status:   Intimate Partner Violence:   . Fear of Current or Ex-Partner:   . Emotionally Abused:   Marland Kitchen Physically Abused:   . Sexually Abused:      Current Outpatient Medications:  .  albuterol (PROVENTIL) (2.5 MG/3ML) 0.083% nebulizer solution, Take 3 mLs  (2.5 mg total) by nebulization every 6 (six) hours as needed for wheezing or shortness of breath., Disp: 150 mL, Rfl: 1 .  aspirin 81 MG chewable tablet, Chew by mouth., Disp: , Rfl:  .  budesonide (PULMICORT) 0.5 MG/2ML nebulizer solution, Take 2 mLs (0.5 mg total) by nebulization 2 (two) times daily., Disp: 120 mL, Rfl: 0 .  donepezil (ARICEPT) 5 MG tablet, Take 1 tablet (5 mg total) by mouth at bedtime., Disp: 90 tablet, Rfl: 1 .  Dulaglutide (TRULICITY) 1.5 SN/0.5LZ SOPN, INJECT 1.5 MG(0.5ML) UNDER THE SKIN ONCE A WEEK., Disp: , Rfl:  .  fenofibrate 160 MG tablet, Take 1 tablet (160 mg total) by mouth daily., Disp: 90 tablet, Rfl: 1 .  furosemide (LASIX) 20 MG tablet, Take 1 tablet (20 mg total) by mouth daily., Disp: 90 tablet, Rfl: 1 .  insulin degludec (TRESIBA FLEXTOUCH) 200 UNIT/ML FlexTouch Pen, Inject into the skin., Disp: , Rfl:  .  Insulin Pen Needle (BD PEN NEEDLE NANO U/F) 32G X 4 MM MISC, USE WITH LEVEMIR FLEXTOUCH DAILY, Disp: , Rfl:  .  levofloxacin (LEVAQUIN) 750 MG tablet, Take 1 tablet (750 mg total) by mouth daily., Disp: 7 tablet, Rfl: 0 .  magnesium oxide (MAG-OX) 400 MG tablet, Take by mouth., Disp: , Rfl:  .  Misc. Devices MISC, DX: Severe sleep apnea Start bipap 16/12 cm. Water with mask and supplies, Disp: , Rfl:  .  Multiple Vitamins-Minerals (MULTIVITAMIN ADULT, MINERALS, PO), Take 1 tablet by mouth., Disp: , Rfl:  .  NONFORMULARY OR COMPOUNDED ITEM, Please provide rolling walker.  Diagnosis: Gait instability, Disp: 1 each, Rfl: 0 .  nystatin-triamcinolone (MYCOLOG II) cream, Apply topically., Disp: , Rfl:  .  omeprazole (PRILOSEC) 20 MG capsule, Take 1 capsule (20 mg total) by mouth daily., Disp: 90 capsule, Rfl: 1 .  Plecanatide (TRULANCE) 3 MG TABS, Take 1 tablet by mouth daily., Disp: 90 tablet, Rfl: 1 .  predniSONE (DELTASONE) 20 MG tablet, Take 1 tablet (20 mg total) by mouth 2 (two) times daily with a meal., Disp: 10 tablet, Rfl: 0 .  pregabalin (LYRICA) 200 MG  capsule, Take by mouth., Disp: , Rfl:  .  Probiotic Product (PROBIOTIC PO), Take 1 tablet by mouth daily., Disp: , Rfl:  .  ranolazine (RANEXA) 500 MG 12 hr tablet, Take by mouth., Disp: , Rfl:  .  rosuvastatin (CRESTOR) 10 MG tablet, Take 1 tablet (10 mg total) by mouth daily., Disp: 90 tablet, Rfl: 1 .  tadalafil (CIALIS) 20 MG tablet, Take 0.5-1 tablets (10-20 mg total) by mouth every other day as needed (ED). Replaces sildenafil as tadalafil  recently approved., Disp: 45 tablet, Rfl: 3 .  lisinopril (ZESTRIL) 2.5 MG tablet, Take 1 tablet (2.5 mg total) by mouth daily. (Patient not taking: Reported on 02/18/2020), Disp: 90 tablet, Rfl: 1  EXAM:  VITALS per patient if applicable: BP (!) 751/70   Pulse 85   Temp 97.8 F (36.6 C)   Ht 5' 6.93" (1.7 m)   Wt 235 lb (106.6 kg)   BMI 36.88 kg/m   GENERAL: alert, oriented, appears well and in no acute distress  HEENT: atraumatic, conjunttiva clear, no obvious abnormalities on inspection of external nose and ears  NECK: normal movements of the head and neck  LUNGS: on inspection no signs of respiratory distress, breathing rate appears normal, no obvious gross SOB, gasping or wheezing  CV: no obvious cyanosis  MS: moves all visible extremities without noticeable abnormality  PSYCH/NEURO: pleasant and cooperative, no obvious depression or anxiety, speech and thought processing grossly intact  ASSESSMENT AND PLAN:  Discussed the following assessment and plan:  Sinobronchitis Will obtain chest xray as I would have expected improvement with augmentin and levaquin with steroid burst x2. Symptoms do not seem suggestive of CHF If CXR negative will plant to see him in office for evaluation and check D-Dimer as well with history of malignancy.      I discussed the assessment and treatment plan with the patient. The patient was provided an opportunity to ask questions and all were answered. The patient agreed with the plan and demonstrated an  understanding of the instructions.   The patient was advised to call back or seek an in-person evaluation if the symptoms worsen or if the condition fails to improve as anticipated.    Randall Nutting, DO

## 2020-02-25 ENCOUNTER — Other Ambulatory Visit: Payer: Self-pay | Admitting: Family Medicine

## 2020-02-25 DIAGNOSIS — J329 Chronic sinusitis, unspecified: Secondary | ICD-10-CM

## 2020-02-25 DIAGNOSIS — J40 Bronchitis, not specified as acute or chronic: Secondary | ICD-10-CM

## 2020-02-25 MED ORDER — PREDNISONE 20 MG PO TABS: 20.0000 mg | ORAL_TABLET | Freq: Two times a day (BID) | ORAL | 0 refills | Status: DC

## 2020-02-28 ENCOUNTER — Encounter: Payer: Self-pay | Admitting: Family Medicine

## 2020-02-28 ENCOUNTER — Other Ambulatory Visit: Payer: Self-pay

## 2020-02-28 ENCOUNTER — Ambulatory Visit (INDEPENDENT_AMBULATORY_CARE_PROVIDER_SITE_OTHER): Payer: 59 | Admitting: Family Medicine

## 2020-02-28 VITALS — BP 125/77 | HR 83 | Ht 66.93 in | Wt 212.0 lb

## 2020-02-28 DIAGNOSIS — R0602 Shortness of breath: Secondary | ICD-10-CM | POA: Diagnosis not present

## 2020-02-28 DIAGNOSIS — R05 Cough: Secondary | ICD-10-CM | POA: Diagnosis not present

## 2020-02-28 DIAGNOSIS — R059 Cough, unspecified: Secondary | ICD-10-CM | POA: Insufficient documentation

## 2020-02-28 LAB — CBC
HCT: 38.3 % — ABNORMAL LOW (ref 38.5–50.0)
Hemoglobin: 12.5 g/dL — ABNORMAL LOW (ref 13.2–17.1)
MCH: 26.9 pg — ABNORMAL LOW (ref 27.0–33.0)
MCHC: 32.6 g/dL (ref 32.0–36.0)
MCV: 82.5 fL (ref 80.0–100.0)
MPV: 11.7 fL (ref 7.5–12.5)
Platelets: 206 10*3/uL (ref 140–400)
RBC: 4.64 10*6/uL (ref 4.20–5.80)
RDW: 13.7 % (ref 11.0–15.0)
WBC: 11.2 10*3/uL — ABNORMAL HIGH (ref 3.8–10.8)

## 2020-02-28 LAB — D-DIMER, QUANTITATIVE: D-Dimer, Quant: <0.19 mcg/mL FEU (ref <0.50)

## 2020-02-28 NOTE — Progress Notes (Signed)
Randall Fisher - 64 y.o. male MRN [id number]  Date of birth: August 19, 1956  Subjective No chief complaint on file.   HPI Randall Fisher is a 64 y.o. male with history of HTN, NSCLC s/p R upper lobectomy, COPD, cognitive impairment and diabetes here today for follow up of cough and shortness of breath. He has completed course of steroids x2, augmentin and levaquin.  Sinus symptoms and congestion have improved however has still had wheezing and cough with some shortness of breath.  He has continued on daily pulmicort nebs and albuterol as needed.  He does get relief with albuterol nebs.  Additional steroid course ordered over the weekend, reports some improvement with this.  He had normal CXR last week.    ROS:  A comprehensive ROS was completed and negative except as noted per HPI  Allergies  Allergen Reactions  . Mushroom Extract Complex Swelling  . Shellfish Allergy Swelling    THROAT SWELLED/ HANDS SWELLED.    Past Medical History:  Diagnosis Date  . Diabetes mellitus without complication ([place])   . Hyperlipidemia   . Joint pain   . Leg swelling   . Numbness and tingling in both hands   . S/P lobectomy of lung 2013   RUL for NSCLC    Past Surgical History:  Procedure Laterality Date  . LUNG LOBECTOMY Right 2013    Social History   Socioeconomic History  . Marital status: Married    Spouse name: Not on file  . Number of children: Not on file  . Years of education: Not on file  . Highest education level: Not on file  Occupational History  . Not on file  Tobacco Use  . Smoking status: Never Smoker  . Smokeless tobacco: Never Used  Substance and Sexual Activity  . Alcohol use: Not Currently  . Drug use: Never  . Sexual activity: Yes    Partners: Female    Birth control/protection: Condom  Other Topics Concern  . Not on file  Social History Narrative  . Not on file   Social Determinants of Health   Financial Resource Strain:   . Difficulty of Paying Living  Expenses:   Food Insecurity:   . Worried About Charity fundraiser in the Last Year:   . Arboriculturist in the Last Year:   Transportation Needs:   . Film/video editor (Medical):   Marland Kitchen Lack of Transportation (Non-Medical):   Physical Activity:   . Days of Exercise per Week:   . Minutes of Exercise per Session:   Stress:   . Feeling of Stress :   Social Connections:   . Frequency of Communication with Friends and Family:   . Frequency of Social Gatherings with Friends and Family:   . Attends Religious Services:   . Active Member of Clubs or Organizations:   . Attends Archivist Meetings:   Marland Kitchen Marital Status:     Family History  Problem Relation Age of Onset  . Diabetes Mother   . Diabetes Father     Health Maintenance  Topic Date Due  . Hepatitis C Screening  11/30/2020 (Originally 1956/06/13)  . INFLUENZA VACCINE  04/16/2020  . HEMOGLOBIN A1C  05/11/2020  . FOOT EXAM  09/16/2020  . OPHTHALMOLOGY EXAM  09/16/2020  . TETANUS/TDAP  07/16/2026  . COLONOSCOPY  08/17/2029  . PNEUMOCOCCAL POLYSACCHARIDE VACCINE AGE 72-64 HIGH RISK  Completed  . COVID-19 Vaccine  Completed  . HIV Screening  Discontinued     -----------------------------------------------------------------------------------------------------------------------------------------------------------------------------------------------------------------  Physical Exam BP 125/77 (BP Location: Left Arm, Patient Position: Sitting, Cuff Size: Large)   Pulse 83   Ht 5' 6.93" (1.7 m)   Wt 212 lb (96.2 kg)   SpO2 96%   BMI 33.27 kg/m   Physical Exam Constitutional:      Appearance: Normal appearance.  HENT:     Head: Normocephalic and atraumatic.  Eyes:     General: No scleral icterus. Cardiovascular:     Rate and Rhythm: Normal rate and regular rhythm.  Pulmonary:     Effort: Pulmonary effort is normal.     Comments: Scattered wheezes bilaterally.  Skin:    General: Skin is warm and dry.   Neurological:     General: No focal deficit present.     Mental Status: He is alert.  Psychiatric:        Mood and Affect: Mood normal.     ------------------------------------------------------------------------------------------------------------------------------------------------------------------------------------------------------------------- Assessment and Plan  Cough Continued cough and dyspnea.  Will continue scheduled budesonide and albuterol as needed.  Complete steroid.  Checking D-dimer today.  If not resolving with this will plan to add ICS+LABA   No orders of the defined types were placed in this encounter.   No follow-ups on file.    This visit occurred during the SARS-CoV-2 public health emergency.  Safety protocols were in place, including screening questions prior to the visit, additional usage of staff PPE, and extensive cleaning of exam room while observing appropriate contact time as indicated for disinfecting solutions.

## 2020-02-28 NOTE — Assessment & Plan Note (Signed)
Continued cough and dyspnea.  Will continue scheduled budesonide and albuterol as needed.  Complete steroid.  Checking D-dimer today.  If not resolving with this will plan to add ICS+LABA

## 2020-02-28 NOTE — Patient Instructions (Signed)
Continue nebulizer treatments and complete course of steroids.  Have labs completed. We'll be in touch with results and next steps from there.

## 2020-02-29 ENCOUNTER — Other Ambulatory Visit: Payer: Self-pay | Admitting: Family Medicine

## 2020-02-29 ENCOUNTER — Telehealth: Payer: 59 | Admitting: Family Medicine

## 2020-02-29 MED ORDER — FLUTICASONE-SALMETEROL 250-50 MCG/DOSE IN AEPB
1.0000 | INHALATION_SPRAY | Freq: Two times a day (BID) | RESPIRATORY_TRACT | 3 refills | Status: DC
Start: 2020-02-29 — End: 2020-04-18

## 2020-03-01 ENCOUNTER — Telehealth: Payer: Self-pay

## 2020-03-01 ENCOUNTER — Other Ambulatory Visit: Payer: Self-pay | Admitting: Family Medicine

## 2020-03-01 DIAGNOSIS — G3184 Mild cognitive impairment, so stated: Secondary | ICD-10-CM

## 2020-03-01 NOTE — Telephone Encounter (Signed)
Imaging from [facility] reviewed.  Some mild senescent changes noted .  Will place referral to neurology for this and his cognitive impairment.   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  If continuing to have lower extremity weakness I would recommend spine surgery referral for this.

## 2020-03-01 NOTE — Telephone Encounter (Signed)
Requesting referral be sent for Neurologist Narda Amber due to Brain MRI conducted yesterday.

## 2020-03-03 NOTE — Telephone Encounter (Signed)
Spoke to Mr. Labonte.   He states the Doctor that he requested a referral to will not be available until October.  Therefore, he contacted another Neurologist and has an appointment.   Also he knows a Chemical engineer and will be contacting him directly.   Advised him to let us know if he needed [place] for anything.

## 2020-03-06 ENCOUNTER — Encounter: Payer: Self-pay | Admitting: Nurse Practitioner

## 2020-03-06 ENCOUNTER — Telehealth (INDEPENDENT_AMBULATORY_CARE_PROVIDER_SITE_OTHER): Payer: 59 | Admitting: Nurse Practitioner

## 2020-03-06 DIAGNOSIS — G3184 Mild cognitive impairment, so stated: Secondary | ICD-10-CM | POA: Diagnosis not present

## 2020-03-06 DIAGNOSIS — E1165 Type 2 diabetes mellitus with hyperglycemia: Secondary | ICD-10-CM

## 2020-03-06 DIAGNOSIS — J449 Chronic obstructive pulmonary disease, unspecified: Secondary | ICD-10-CM | POA: Diagnosis not present

## 2020-03-06 DIAGNOSIS — W19XXXD Unspecified fall, subsequent encounter: Secondary | ICD-10-CM

## 2020-03-06 NOTE — Patient Instructions (Signed)
This is the medication we talked about in your appointment today. Taking this daily may help with your respiratory symptoms. Some people who have seasonal symptoms choose to start taking it about a month before their symptoms usually start to help prevent them from getting bad. This is always something that we can look into in the future if you would like.   Montelukast oral tablets What is this medicine? MONTELUKAST (mon te LOO kast) is used to prevent and treat the symptoms of asthma. It is also used to treat allergies. Do not use for an acute asthma attack. This medicine may be used for other purposes; ask your health care provider or pharmacist if you have questions. COMMON BRAND NAME(S): Singulair What should I tell my health care provider before I take this medicine? They need to know if you have any of these conditions:  liver disease  an unusual or allergic reaction to montelukast, other medicines, foods, dyes, or preservatives  pregnant or trying to get pregnant  breast-feeding How should I use this medicine? This medicine should be given by mouth. Follow the directions on the prescription label. Take this medicine at the same time every day. You may take this medicine with or without meals. Do not chew the tablets. Do not stop taking your medicine unless your doctor tells you to. Talk to your pediatrician regarding the use of this medicine in children. Special care may be needed. While this drug may be prescribed for children as young as 106 years of age for selected conditions, precautions do apply. Overdosage: If you think you have taken too much of this medicine contact a poison control center or emergency room at once. NOTE: This medicine is only for you. Do not share this medicine with others. What if I miss a dose? If you miss a dose, skip it. Take your next dose at the normal time. Do not take extra or 2 doses at the same time to make up for the missed dose. What may interact  with this medicine?  anti-infectives like rifampin and rifabutin  medicines for seizures like phenytoin, phenobarbital, and carbamazepine This list may not describe all possible interactions. Give your health care provider a list of all the medicines, herbs, non-prescription drugs, or dietary supplements you use. Also tell them if you smoke, drink alcohol, or use illegal drugs. Some items may interact with your medicine. What should I watch for while using this medicine? Visit your doctor or health care professional for regular checks on your progress. Tell your doctor or health care professional if your allergy or asthma symptoms do not improve. Take your medicine even when you do not have symptoms. Do not stop taking any of your medicine(s) unless your doctor tells you to. If you have asthma, talk to your doctor about what to do in an acute asthma attack. Always have your inhaled rescue medicine for asthma attacks with you. Patients and their families should watch for new or worsening thoughts of suicide or depression. Also watch for sudden changes in feelings such as feeling anxious, agitated, panicky, irritable, hostile, aggressive, impulsive, severely restless, overly excited and hyperactive, or not being able to sleep. Any worsening of mood or thoughts of suicide or dying should be reported to your health care professional right away. What side effects may I notice from receiving this medicine? Side effects that you should report to your doctor or health care professional as soon as possible:  allergic reactions like skin rash or hives, or  swelling of the face, lips, or tongue  breathing problems  changes in emotions or moods  confusion  depressed mood  fever or infection  hallucinations  joint pain  painful lumps under the skin  pain, tingling, numbness in the hands or feet  redness, blistering, peeling, or loosening of the skin, including inside the  mouth  restlessness  seizures  sleep walking  signs and symptoms of infection like fever; chills; cough; sore throat; flu-like illness  signs and symptoms of liver injury like dark yellow or brown urine; general ill feeling or flu-like symptoms; light-colored stools; loss of appetite; nausea; right upper belly pain; unusually weak or tired; yellowing of the eyes or skin  sinus pain or swelling  stuttering  suicidal thoughts or other mood changes  tremors  trouble sleeping  uncontrolled muscle movements  unusual bleeding or bruising  vivid or bad dreams Side effects that usually do not require medical attention (report to your doctor or health care professional if they continue or are bothersome):  dizziness  drowsiness  headache  runny nose  stomach upset  tiredness This list may not describe all possible side effects. Call your doctor for medical advice about side effects. You may report side effects to FDA at 1-800-FDA-1088. Where should I keep my medicine? Keep out of the reach of children. Store at room temperature between 15 and 30 degrees C (59 and 86 degrees F). Protect from light and moisture. Keep this medicine in the original bottle. Throw away any unused medicine after the expiration date. NOTE: This sheet is a summary. It may not cover all possible information. If you have questions about this medicine, talk to your doctor, pharmacist, or health care provider.  2020 Elsevier/Gold Standard (2019-01-01 12:54:33)

## 2020-03-06 NOTE — Progress Notes (Signed)
Virtual Video Visit via MyChart Note  I connected with  Randall Fisher on 03/06/20 at  1:30 PM EDT by the video enabled telemedicine application for , MyChart, and verified that I am speaking with the correct person using two identifiers.   I introduced myself as a Designer, jewellery with the practice. We discussed the limitations of evaluation and management by telemedicine and the availability of in person appointments. The patient expressed understanding and agreed to proceed.  The patient is: at home I am: in the office  Subjective:    CC:  Chief Complaint  Patient presents with   Follow-up    [facility] 03/01/2020   Hyperglycemia    recent steroid taper dose made his blood sugar spike, he went to the hospital for this and is following up today    HPI: Randall Fisher is a 64 y.o. y/o male presenting via [place] today for follow-up from recent hospital visit on 03/01/2020. He was recently prescribed a steroid burst by his PCP for ongoing respiratory issues, most likely brought on by prolonged bronchitis. He reports that while on the steroid for his lungs he experienced a fall with an injury to his back. He was seen by orthopedics and placed on an additional steroid. He reports he is unsure if he told the orthopedist that he was on the steroid when the new prescription was written. When he woke on 03/01/2020, he reports his blood sugars were >600 and he went to the emergency room for evaluation. He was given insulin and fluids and told to stop the steroids, as these were causing an increase in his blood glucose levels.   He reports since that time his blood sugar levels have been under control and he has not experienced any additional episodes of hyperglycemia.   He is still dealing with his back issues and has not been able to walk without the aid of a rolling walker. He is currently awaiting follow-up with the orthopedist for further evaluation of his back.   He has an appointment on  Wednesday with neurology to discuss symptoms of forgetfulness, weakness, and tremors. He reports these symptoms have been ongoing for about the past year, but are getting worse.   He reports that his breathing has improved with the addition of the fluticasone-salmeterol inhaler to his regimen, although he is still experiencing some shortness of breath and cough with activity.   Past medical history, Surgical history, Family history not pertinant except as noted below, Social history, Allergies, and medications have been entered into the medical record, reviewed, and corrections made.   Review of Systems:  See HPI for pertinent positive and negatives  Objective:    General: Speaking clearly in complete sentences without any shortness of breath.   Alert and oriented x3.   Normal judgment.  No apparent acute distress.   Impression and Recommendations:   1. Chronic bronchitis with COPD (chronic obstructive pulmonary disease) (HCC) Chronic bronchitis for the past several months. It is unclear if the patient has had spirometry in the past. We did discuss preforming spirometry once his breathing has improved to his maximum function and he is able to come into the office for testing. Discussed the option to add montelukast to his medication regimen during the spring when he typically experiences prolonged symptoms of bronchitis. Information provided today.  PLAN: - Continue Pulmicort and Albuterol nebulizer and Advair inhaler as prescribed.  - Consider adding montelukast to medication regimen in late winter to prevent future exacerbations. -  Plan for spirometry testing in office once lung function has returned to baseline.  - Follow-up if symptoms worsen or fail to improve  2. Type 2 diabetes mellitus with hyperglycemia, without long-term current use of insulin (HCC) Hyperglycemic event related to accidental double dose of oral steroids. Steroids have been stopped.   PLAN: - Continue to  monitor your blood sugars and take your medication for diabetes as prescribed.  - May consider adding back ONE prescription of oral prednisone 20mg  once a day for back injury, but monitor blood sugar levels closely and stop the medication if they begin to rise again.  - Follow-up if blood sugar levels are not controlled on current regimen.  3. Mild cognitive impairment Follow-up with neurology on Wednesday as planned. Will follow along in the chart.  4. Fall, subsequent encounter Follow-up with orthopedics as planned. If unable to get an appointment with the current specialist, discussed that we can place a different referral if needed.     I discussed the assessment and treatment plan with the patient. The patient was provided an opportunity to ask questions and all were answered. The patient agreed with the plan and demonstrated an understanding of the instructions.   The patient was advised to call back or seek an in-person evaluation if the symptoms worsen or if the condition fails to improve as anticipated.  I provided 20 minutes of non-face-to-face interaction with this [place] visit including intake, same-day documentation, and chart review.   Orma Render, NP

## 2020-04-05 ENCOUNTER — Encounter: Payer: Self-pay | Admitting: Family Medicine

## 2020-04-18 ENCOUNTER — Ambulatory Visit (INDEPENDENT_AMBULATORY_CARE_PROVIDER_SITE_OTHER): Payer: 59 | Admitting: Family Medicine

## 2020-04-18 ENCOUNTER — Other Ambulatory Visit: Payer: Self-pay

## 2020-04-18 ENCOUNTER — Encounter: Payer: Self-pay | Admitting: Family Medicine

## 2020-04-18 VITALS — BP 134/78 | HR 96 | Ht 66.93 in | Wt 224.0 lb

## 2020-04-18 DIAGNOSIS — F330 Major depressive disorder, recurrent, mild: Secondary | ICD-10-CM

## 2020-04-18 DIAGNOSIS — I872 Venous insufficiency (chronic) (peripheral): Secondary | ICD-10-CM | POA: Diagnosis not present

## 2020-04-18 DIAGNOSIS — F329 Major depressive disorder, single episode, unspecified: Secondary | ICD-10-CM | POA: Insufficient documentation

## 2020-04-18 DIAGNOSIS — F320 Major depressive disorder, single episode, mild: Secondary | ICD-10-CM | POA: Diagnosis not present

## 2020-04-18 NOTE — Progress Notes (Signed)
Randall Fisher - 64 y.o. male MRN [id number]  Date of birth: [birth year]  Subjective Chief Complaint  Patient presents with   Leg Swelling    HPI Randall Fisher is a  64 y.o.  Here today with complaint of bilateral leg swelling.  He has history of HTN, NSCLC, COPD, T2DM, mild cognitive impairment.   Leg swelling is a chronic issue for him, worsened over the past few months.  Swelling increases during the day and improves overnight.  He does sometimes fall asleep in his recliner and doesn't have feet elevated and feet will have more swelling.  He has been using lasix daily.  He has compression socks but can't get these on.  He denies chest pain, shortness of breath, palpitations, or orthopnea.   He also admits to some increased symptoms of depression.  He and his wife are going through a rough period.  They are considering separation, he is hoping to avoid this.  She has history of bipolar disorder and is not taking medications.  He would like to see a therapist before adding medication.    ROS:  A comprehensive ROS was completed and negative except as noted per HPI   Allergies  Allergen Reactions   Mushroom Extract Complex Swelling   Shellfish Allergy Swelling    THROAT SWELLED/ HANDS SWELLED.    Past Medical History:  Diagnosis Date   Diabetes mellitus without complication (HCC)    Hyperlipidemia    Joint pain    Leg swelling    Numbness and tingling in both hands    S/P lobectomy of lung 2013   RUL for NSCLC    Past Surgical History:  Procedure Laterality Date   LUNG LOBECTOMY Right 2013    Social History   Socioeconomic History   Marital status: Married    Spouse name: Not on file   Number of children: Not on file   Years of education: Not on file   Highest education level: Not on file  Occupational History   Not on file  Tobacco Use   Smoking status: Never Smoker   Smokeless tobacco: Never Used  Substance and Sexual Activity   Alcohol use:  Not Currently   Drug use: Never   Sexual activity: Yes    Partners: Female    Birth control/protection: Condom  Other Topics Concern   Not on file  Social History Narrative   Not on file   Social Determinants of Health   Financial Resource Strain:    Difficulty of Paying Living Expenses:   Food Insecurity:    Worried About Charity fundraiser in the Last Year:    Arboriculturist in the Last Year:   Transportation Needs:    Film/video editor (Medical):    Lack of Transportation (Non-Medical):   Physical Activity:    Days of Exercise per Week:    Minutes of Exercise per Session:   Stress:    Feeling of Stress :   Social Connections:    Frequency of Communication with Friends and Family:    Frequency of Social Gatherings with Friends and Family:    Attends Religious Services:    Active Member of Clubs or Organizations:    Attends Music therapist:    Marital Status:     Family History  Problem Relation Age of Onset   Diabetes Mother    Diabetes Father     Health Maintenance  Topic Date Due   URINE MICROALBUMIN  Never done   INFLUENZA VACCINE  04/16/2020   Hepatitis C Screening  11/30/2020 (Originally [birth year])   HEMOGLOBIN A1C  05/11/2020   FOOT EXAM  09/16/2020   OPHTHALMOLOGY EXAM  09/16/2020   TETANUS/TDAP  07/16/2026   COLONOSCOPY  08/17/2029   PNEUMOCOCCAL POLYSACCHARIDE VACCINE AGE 57-64 HIGH RISK  Completed   COVID-19 Vaccine  Completed   HIV Screening  Discontinued     ----------------------------------------------------------------------------------------------------------------------------------------------------------------------------------------------------------------- Physical Exam BP 134/78 (BP Location: Right Arm, Patient Position: Sitting, Cuff Size: Normal)    Pulse 96    Ht 5' 6.93" (1.7 m)    Wt 224 lb (101.6 kg)    SpO2 99%    BMI 35.16 kg/m   Physical Exam Constitutional:       Appearance: Normal appearance.  HENT:     Head: Normocephalic and atraumatic.  Eyes:     General: No scleral icterus. Cardiovascular:     Rate and Rhythm: Normal rate and regular rhythm.  Pulmonary:     Effort: Pulmonary effort is normal.     Breath sounds: Normal breath sounds.  Musculoskeletal:     Cervical back: Neck supple.     Comments: 1+ bilateral edema   Neurological:     General: No focal deficit present.     Mental Status: He is alert.  Psychiatric:        Mood and Affect: Mood normal.        Behavior: Behavior normal.     ------------------------------------------------------------------------------------------------------------------------------------------------------------------------------------------------------------------- Assessment and Plan  Major depression He is interested in pursuing counseling, referral placed.  He would like to hold off on medication for now   Venous insufficiency No other symptoms such as sudden weight gain, dyspnea or chest pain to suggest cardiac etiology.  He may alternate lasix 40mg  every other day with the 20mg .   Keep legs elevated  Try to wear compression stockings.     No orders of the defined types were placed in this encounter.   Return in about 2 weeks (around 05/02/2020) for edema.    This visit occurred during the SARS-CoV-2 public health emergency.  Safety protocols were in place, including screening questions prior to the visit, additional usage of staff PPE, and extensive cleaning of exam room while observing appropriate contact time as indicated for disinfecting solutions.

## 2020-04-18 NOTE — Assessment & Plan Note (Signed)
No other symptoms such as sudden weight gain, dyspnea or chest pain to suggest cardiac etiology.  He may alternate lasix 40mg  every other day with the 20mg .   Keep legs elevated  Try to wear compression stockings.

## 2020-04-18 NOTE — Patient Instructions (Signed)
Try increasing lasix to 40mg  every other day.  See me again in 2 weeks

## 2020-04-18 NOTE — Assessment & Plan Note (Signed)
He is interested in pursuing counseling, referral placed.  He would like to hold off on medication for now

## 2020-04-21 ENCOUNTER — Other Ambulatory Visit: Payer: Self-pay

## 2020-04-21 MED ORDER — FENOFIBRATE 160 MG PO TABS: 160.0000 mg | ORAL_TABLET | Freq: Every day | ORAL | 1 refills | Status: DC

## 2020-04-21 MED ORDER — OMEPRAZOLE 20 MG PO CPDR: 20.0000 mg | DELAYED_RELEASE_CAPSULE | Freq: Every day | ORAL | 1 refills | Status: DC

## 2020-04-21 MED ORDER — FUROSEMIDE 20 MG PO TABS: 20.0000 mg | ORAL_TABLET | Freq: Every day | ORAL | 1 refills | Status: DC

## 2020-04-25 ENCOUNTER — Ambulatory Visit: Payer: 59 | Admitting: Psychology

## 2020-04-27 ENCOUNTER — Ambulatory Visit (INDEPENDENT_AMBULATORY_CARE_PROVIDER_SITE_OTHER): Payer: 59 | Admitting: Psychology

## 2020-04-27 DIAGNOSIS — F4323 Adjustment disorder with mixed anxiety and depressed mood: Secondary | ICD-10-CM

## 2020-05-03 ENCOUNTER — Telehealth (INDEPENDENT_AMBULATORY_CARE_PROVIDER_SITE_OTHER): Payer: 59 | Admitting: Family Medicine

## 2020-05-03 ENCOUNTER — Other Ambulatory Visit: Payer: Self-pay

## 2020-05-03 ENCOUNTER — Encounter: Payer: Self-pay | Admitting: Family Medicine

## 2020-05-03 DIAGNOSIS — I872 Venous insufficiency (chronic) (peripheral): Secondary | ICD-10-CM

## 2020-05-03 DIAGNOSIS — F320 Major depressive disorder, single episode, mild: Secondary | ICD-10-CM

## 2020-05-03 NOTE — Assessment & Plan Note (Signed)
Improved with lasix, continue as needed.  F/u in 3-4 weeks and recheck renal function/electrolytes.  Encouraged compression stocking use.

## 2020-05-03 NOTE — Assessment & Plan Note (Signed)
Has initial visit with therapist, will continue with regular counseling.

## 2020-05-03 NOTE — Progress Notes (Signed)
Randall Fisher - 64 y.o. male MRN [id number]  Date of birth: 1955-12-02   This visit type was conducted due to national recommendations for restrictions regarding the COVID-19 Pandemic (e.g. social distancing).  This format is felt to be most appropriate for this patient at this time.  All issues noted in this document were discussed and addressed.  No physical exam was performed (except for noted visual exam findings with Video Visits).  I discussed the limitations of evaluation and management by telemedicine and the availability of in person appointments. The patient expressed understanding and agreed to proceed.  I connected with@ on 05/03/20 at  2:20 PM EDT by a video enabled telemedicine application and verified that I am speaking with the correct person using two identifiers.  Present at visit: [name], DO [name]   Patient Location: Home [address] [place] [address]   Provider location:   [facility]  Chief Complaint  Patient presents with  . Edema    HPI  [name] is a 64 y.o. male who presents via audio/video conferencing for a telehealth visit today.  He is following up today for edema.  He reports that change in furosemide has worked well for him.  He is having some increased back pain and has upcoming injection.  He plans to try compression stockings once back pain has improved.    He has had one session with therapist recently as well.  This was helpful so far.     ROS:  A comprehensive ROS was completed and negative except as noted per HPI  Past Medical History:  Diagnosis Date  . Diabetes mellitus without complication ([place])   . Hyperlipidemia   . Joint pain   . Leg swelling   . Numbness and tingling in both hands   . S/P lobectomy of lung 2013   RUL for NSCLC    Past Surgical History:  Procedure Laterality Date  . LUNG LOBECTOMY Right 2013    Family History  Problem Relation Age of Onset  . Diabetes Mother   . Diabetes  Father     Social History   Socioeconomic History  . Marital status: Married    Spouse name: Not on file  . Number of children: Not on file  . Years of education: Not on file  . Highest education level: Not on file  Occupational History  . Not on file  Tobacco Use  . Smoking status: Never Smoker  . Smokeless tobacco: Never Used  Substance and Sexual Activity  . Alcohol use: Not Currently  . Drug use: Never  . Sexual activity: Yes    Partners: Female    Birth control/protection: Condom  Other Topics Concern  . Not on file  Social History Narrative  . Not on file   Social Determinants of Health   Financial Resource Strain:   . Difficulty of Paying Living Expenses:   Food Insecurity:   . Worried About Charity fundraiser in the Last Year:   . Arboriculturist in the Last Year:   Transportation Needs:   . Film/video editor (Medical):   Marland Kitchen Lack of Transportation (Non-Medical):   Physical Activity:   . Days of Exercise per Week:   . Minutes of Exercise per Session:   Stress:   . Feeling of Stress :   Social Connections:   . Frequency of Communication with Friends and Family:   . Frequency of Social Gatherings with Friends and Family:   .  Attends Religious Services:   . Active Member of Clubs or Organizations:   . Attends Archivist Meetings:   Marland Kitchen Marital Status:   Intimate Partner Violence:   . Fear of Current or Ex-Partner:   . Emotionally Abused:   Marland Kitchen Physically Abused:   . Sexually Abused:      Current Outpatient Medications:  .  albuterol (PROVENTIL) (2.5 MG/3ML) 0.083% nebulizer solution, Take 3 mLs (2.5 mg total) by nebulization every 6 (six) hours as needed for wheezing or shortness of breath., Disp: 150 mL, Rfl: 1 .  aspirin 81 MG chewable tablet, Chew by mouth., Disp: , Rfl:  .  donepezil (ARICEPT) 5 MG tablet, Take 1 tablet (5 mg total) by mouth at bedtime., Disp: 90 tablet, Rfl: 1 .  Dulaglutide (TRULICITY) 1.5 XB/3.5HG SOPN, INJECT 1.5  MG(0.5ML) UNDER THE SKIN ONCE A WEEK., Disp: , Rfl:  .  fenofibrate 160 MG tablet, Take 1 tablet (160 mg total) by mouth daily., Disp: 90 tablet, Rfl: 1 .  furosemide (LASIX) 20 MG tablet, Take 1 tablet (20 mg total) by mouth daily., Disp: 90 tablet, Rfl: 1 .  insulin degludec (TRESIBA FLEXTOUCH) 200 UNIT/ML FlexTouch Pen, Inject 120 Units into the skin. , Disp: , Rfl:  .  Insulin Pen Needle (BD PEN NEEDLE NANO U/F) 32G X 4 MM MISC, USE WITH LEVEMIR FLEXTOUCH DAILY, Disp: , Rfl:  .  Misc. Devices MISC, DX: Severe sleep apnea Start bipap 16/12 cm. Water with mask and supplies, Disp: , Rfl:  .  Multiple Vitamins-Minerals (MULTIVITAMIN ADULT, MINERALS, PO), Take 1 tablet by mouth., Disp: , Rfl:  .  NONFORMULARY OR COMPOUNDED ITEM, Please provide rolling walker.  Diagnosis: Gait instability, Disp: 1 each, Rfl: 0 .  nystatin-triamcinolone (MYCOLOG II) cream, Apply topically., Disp: , Rfl:  .  omeprazole (PRILOSEC) 20 MG capsule, Take 1 capsule (20 mg total) by mouth daily., Disp: 90 capsule, Rfl: 1 .  pregabalin (LYRICA) 200 MG capsule, Take 200 mg by mouth 2 (two) times daily. , Disp: , Rfl:  .  Probiotic Product (PROBIOTIC PO), Take 1 tablet by mouth daily., Disp: , Rfl:  .  rosuvastatin (CRESTOR) 10 MG tablet, Take 1 tablet (10 mg total) by mouth daily., Disp: 90 tablet, Rfl: 1 .  tadalafil (CIALIS) 20 MG tablet, Take 0.5-1 tablets (10-20 mg total) by mouth every other day as needed (ED). Replaces sildenafil as tadalafil recently approved., Disp: 45 tablet, Rfl: 3  EXAM:  VITALS per patient if applicable: There were no vitals taken for this visit.  GENERAL: alert, oriented, appears well and in no acute distress  HEENT: atraumatic, conjunttiva clear, no obvious abnormalities on inspection of external nose and ears  NECK: normal movements of the head and neck  LUNGS: on inspection no signs of respiratory distress, breathing rate appears normal, no obvious gross SOB, gasping or wheezing  CV: no  obvious cyanosis  MS: moves all visible extremities without noticeable abnormality  PSYCH/NEURO: pleasant and cooperative, no obvious depression or anxiety, speech and thought processing grossly intact  ASSESSMENT AND PLAN:  Discussed the following assessment and plan:  Mild major depression, single episode ([place]) Has initial visit with therapist, will continue with regular counseling.   Venous insufficiency Improved with lasix, continue as needed.  F/u in 3-4 weeks and recheck renal function/electrolytes.  Encouraged compression stocking use.      I discussed the assessment and treatment plan with the patient. The patient was provided an opportunity to ask questions and all  were answered. The patient agreed with the plan and demonstrated an understanding of the instructions.   The patient was advised to call back or seek an in-person evaluation if the symptoms worsen or if the condition fails to improve as anticipated.    [name], DO

## 2020-05-03 NOTE — Progress Notes (Signed)
He reports that his edema is better. He is still not able to get his compression socks/stockings on due to his back but stated that he will attempt to try after he gets his shots on Sept. 3.   He takes the Furosemide 20 mg alternating between 40 and 20 mg every other day

## 2020-05-04 ENCOUNTER — Ambulatory Visit (INDEPENDENT_AMBULATORY_CARE_PROVIDER_SITE_OTHER): Payer: 59 | Admitting: Psychology

## 2020-05-04 DIAGNOSIS — F331 Major depressive disorder, recurrent, moderate: Secondary | ICD-10-CM | POA: Diagnosis not present

## 2020-05-11 ENCOUNTER — Ambulatory Visit: Payer: 59 | Admitting: Psychology

## 2020-06-07 ENCOUNTER — Ambulatory Visit (INDEPENDENT_AMBULATORY_CARE_PROVIDER_SITE_OTHER): Payer: 59 | Admitting: Psychology

## 2020-06-07 DIAGNOSIS — F331 Major depressive disorder, recurrent, moderate: Secondary | ICD-10-CM

## 2020-06-13 ENCOUNTER — Other Ambulatory Visit: Payer: Self-pay

## 2020-06-13 MED ORDER — DONEPEZIL HCL 5 MG PO TABS: 5.0000 mg | ORAL_TABLET | Freq: Every day | ORAL | 1 refills | Status: DC

## 2020-06-14 ENCOUNTER — Ambulatory Visit (INDEPENDENT_AMBULATORY_CARE_PROVIDER_SITE_OTHER): Payer: 59 | Admitting: Psychology

## 2020-06-14 DIAGNOSIS — F331 Major depressive disorder, recurrent, moderate: Secondary | ICD-10-CM

## 2020-06-29 ENCOUNTER — Ambulatory Visit: Payer: 59 | Admitting: Psychology

## 2020-07-06 ENCOUNTER — Ambulatory Visit (INDEPENDENT_AMBULATORY_CARE_PROVIDER_SITE_OTHER): Payer: 59 | Admitting: Psychology

## 2020-07-06 DIAGNOSIS — F331 Major depressive disorder, recurrent, moderate: Secondary | ICD-10-CM | POA: Diagnosis not present

## 2020-07-18 ENCOUNTER — Ambulatory Visit (INDEPENDENT_AMBULATORY_CARE_PROVIDER_SITE_OTHER): Payer: 59 | Admitting: Psychology

## 2020-07-18 DIAGNOSIS — F331 Major depressive disorder, recurrent, moderate: Secondary | ICD-10-CM | POA: Diagnosis not present

## 2020-07-31 ENCOUNTER — Ambulatory Visit: Payer: 59 | Admitting: Psychology

## 2020-08-03 ENCOUNTER — Telehealth: Payer: 59 | Admitting: Family Medicine

## 2020-08-03 ENCOUNTER — Ambulatory Visit: Payer: 59 | Admitting: Psychology

## 2020-08-28 DIAGNOSIS — G5601 Carpal tunnel syndrome, right upper limb: Secondary | ICD-10-CM | POA: Insufficient documentation

## 2020-09-14 ENCOUNTER — Other Ambulatory Visit: Payer: Self-pay

## 2020-09-22 ENCOUNTER — Telehealth (INDEPENDENT_AMBULATORY_CARE_PROVIDER_SITE_OTHER): Payer: 59 | Admitting: Family Medicine

## 2020-09-22 DIAGNOSIS — R3915 Urgency of urination: Secondary | ICD-10-CM | POA: Diagnosis not present

## 2020-09-22 DIAGNOSIS — R350 Frequency of micturition: Secondary | ICD-10-CM

## 2020-09-22 DIAGNOSIS — N401 Enlarged prostate with lower urinary tract symptoms: Secondary | ICD-10-CM | POA: Diagnosis not present

## 2020-09-22 DIAGNOSIS — E1121 Type 2 diabetes mellitus with diabetic nephropathy: Secondary | ICD-10-CM | POA: Diagnosis not present

## 2020-09-22 NOTE — Progress Notes (Unsigned)
Attempted to contact patient via phone for pre-visit. No answer. LVM for callback.

## 2020-09-24 ENCOUNTER — Encounter: Payer: Self-pay | Admitting: Family Medicine

## 2020-09-24 DIAGNOSIS — R3915 Urgency of urination: Secondary | ICD-10-CM | POA: Insufficient documentation

## 2020-09-24 DIAGNOSIS — N390 Urinary tract infection, site not specified: Secondary | ICD-10-CM | POA: Insufficient documentation

## 2020-09-24 NOTE — Assessment & Plan Note (Signed)
Check UA, PSA and renal function.  Discussed trial of flomax, pending lab results. .  Will treat for infection if UA consistent with this.

## 2020-09-24 NOTE — Progress Notes (Signed)
Randall Fisher - 65 y.o. male MRN [id number]  Date of birth: [birth year]   This visit type was conducted due to national recommendations for restrictions regarding the COVID-19 Pandemic (e.g. social distancing).  This format is felt to be most appropriate for this patient at this time.  All issues noted in this document were discussed and addressed.  No physical exam was performed (except for noted visual exam findings with Video Visits).  I discussed the limitations of evaluation and management by telemedicine and the availability of in person appointments. The patient expressed understanding and agreed to proceed.  I connected with@ on 09/24/20 at  9:10 AM EST by a video enabled telemedicine application and verified that I am speaking with the correct person using two identifiers.  Present at visit: [name], DO [name]   Patient Location: Home [address] [place] [address]   Provider location:   [place]  No chief complaint on file.   HPI  Randall Fisher is a 65 y.o. male who presents via audio/video conferencing for a telehealth visit today.  He has complaint today of urinary frequency and urgency. Often times he feels like he doesn't completely empty.  Denies pain with urination.  He has history of BPH and T2DM.  Reports that blood sugars have been well controlled at home with current medications.  He is not currently taking anything for BPH.   ROS:  A comprehensive ROS was completed and negative except as noted per HPI  Past Medical History:  Diagnosis Date  . Diabetes mellitus without complication ([place])   . Hyperlipidemia   . Joint pain   . Leg swelling   . Numbness and tingling in both hands   . S/P lobectomy of lung 2013   RUL for NSCLC    Past Surgical History:  Procedure Laterality Date  . LUNG LOBECTOMY Right 2013    Family History  Problem Relation Age of Onset  . Diabetes Mother   . Diabetes Father     Social History    Socioeconomic History  . Marital status: Married    Spouse name: Not on file  . Number of children: Not on file  . Years of education: Not on file  . Highest education level: Not on file  Occupational History  . Not on file  Tobacco Use  . Smoking status: Never Smoker  . Smokeless tobacco: Never Used  Substance and Sexual Activity  . Alcohol use: Not Currently  . Drug use: Never  . Sexual activity: Yes    Partners: Female    Birth control/protection: Condom  Other Topics Concern  . Not on file  Social History Narrative  . Not on file   Social Determinants of Health   Financial Resource Strain: Not on file  Food Insecurity: Not on file  Transportation Needs: Not on file  Physical Activity: Not on file  Stress: Not on file  Social Connections: Not on file  Intimate Partner Violence: Not on file     Current Outpatient Medications:  .  atropine 1 % ophthalmic solution, 2 drops daily. SUBLINGUAL., Disp: , Rfl:  .  budesonide (PULMICORT) 0.5 MG/2ML nebulizer solution, Take 0.5 mg by nebulization daily as needed., Disp: , Rfl:  .  Fluticasone-Salmeterol (ADVAIR DISKUS) 250-50 MCG/DOSE AEPB, Inhale into the lungs., Disp: , Rfl:  .  sertraline (ZOLOFT) 25 MG tablet, Take by mouth., Disp: , Rfl:  .  albuterol (PROVENTIL) (2.5 MG/3ML) 0.083% nebulizer solution, Take 3 mLs (  2.5 mg total) by nebulization every 6 (six) hours as needed for wheezing or shortness of breath., Disp: 150 mL, Rfl: 1 .  aspirin 81 MG chewable tablet, Chew by mouth., Disp: , Rfl:  .  cephALEXin (KEFLEX) 500 MG capsule, Take 500 mg by mouth 4 (four) times daily., Disp: , Rfl:  .  dextromethorphan-guaiFENesin (MUCINEX DM) 30-600 MG 12hr tablet, Take by mouth., Disp: , Rfl:  .  donepezil (ARICEPT) 5 MG tablet, Take 1 tablet (5 mg total) by mouth at bedtime., Disp: 90 tablet, Rfl: 1 .  Dulaglutide (TRULICITY) 1.5 QP/6.1PJ SOPN, INJECT 1.5 MG(0.5ML) UNDER THE SKIN ONCE A WEEK., Disp: , Rfl:  .  fenofibrate 160  MG tablet, Take 1 tablet (160 mg total) by mouth daily., Disp: 90 tablet, Rfl: 1 .  fexofenadine-pseudoephedrine (ALLEGRA-D) 60-120 MG 12 hr tablet, Take 1 tablet by mouth daily as needed., Disp: , Rfl:  .  furosemide (LASIX) 20 MG tablet, Take 1 tablet (20 mg total) by mouth daily., Disp: 90 tablet, Rfl: 1 .  HYDROcodone-acetaminophen (NORCO/VICODIN) 5-325 MG tablet, TAKE 1 TO 2 TABLETS BY MOUTH EVERY 6 HOURS AS NEEDED FOR PAIN FOR UP TO 7 DAYS., Disp: , Rfl:  .  insulin degludec (TRESIBA FLEXTOUCH) 200 UNIT/ML FlexTouch Pen, Inject 120 Units into the skin. , Disp: , Rfl:  .  Insulin Pen Needle (BD PEN NEEDLE NANO U/F) 32G X 4 MM MISC, USE WITH LEVEMIR FLEXTOUCH DAILY, Disp: , Rfl:  .  Misc. Devices MISC, DX: Severe sleep apnea Start bipap 16/12 cm. Water with mask and supplies, Disp: , Rfl:  .  Multiple Vitamins-Minerals (MULTIVITAMIN ADULT, MINERALS, PO), Take 1 tablet by mouth., Disp: , Rfl:  .  NONFORMULARY OR COMPOUNDED ITEM, Please provide rolling walker.  Diagnosis: Gait instability, Disp: 1 each, Rfl: 0 .  nystatin-triamcinolone (MYCOLOG II) cream, Apply topically., Disp: , Rfl:  .  omeprazole (PRILOSEC) 20 MG capsule, Take 1 capsule (20 mg total) by mouth daily., Disp: 90 capsule, Rfl: 1 .  ondansetron (ZOFRAN) 4 MG tablet, Take 4 mg by mouth daily as needed., Disp: , Rfl:  .  pregabalin (LYRICA) 200 MG capsule, Take 200 mg by mouth 2 (two) times daily. , Disp: , Rfl:  .  Probiotic Product (PROBIOTIC PO), Take 1 tablet by mouth daily., Disp: , Rfl:  .  rosuvastatin (CRESTOR) 10 MG tablet, Take 1 tablet (10 mg total) by mouth daily., Disp: 90 tablet, Rfl: 1 .  tadalafil (CIALIS) 20 MG tablet, Take 0.5-1 tablets (10-20 mg total) by mouth every other day as needed (ED). Replaces sildenafil as tadalafil recently approved., Disp: 45 tablet, Rfl: 3  EXAM:  VITALS per patient if applicable: There were no vitals taken for this visit.  GENERAL: alert, oriented, appears well and in no acute  distress  HEENT: atraumatic, conjunttiva clear, no obvious abnormalities on inspection of external nose and ears  NECK: normal movements of the head and neck  LUNGS: on inspection no signs of respiratory distress, breathing rate appears normal, no obvious gross SOB, gasping or wheezing  CV: no obvious cyanosis  MS: moves all visible extremities without noticeable abnormality  PSYCH/NEURO: pleasant and cooperative, no obvious depression or anxiety, speech and thought processing grossly intact  ASSESSMENT AND PLAN:  Discussed the following assessment and plan:  Urinary urgency Check UA, PSA and renal function.  Discussed trial of flomax, pending lab results. .  Will treat for infection if UA consistent with this.       I  discussed the assessment and treatment plan with the patient. The patient was provided an opportunity to ask questions and all were answered. The patient agreed with the plan and demonstrated an understanding of the instructions.   The patient was advised to call back or seek an in-person evaluation if the symptoms worsen or if the condition fails to improve as anticipated.    [name], DO

## 2020-09-26 LAB — COMPLETE METABOLIC PANEL WITH GFR
AG Ratio: 1.9 (calc) (ref 1.0–2.5)
ALT: 22 U/L (ref 9–46)
AST: 19 U/L (ref 10–35)
Albumin: 4.4 g/dL (ref 3.6–5.1)
Alkaline phosphatase (APISO): 45 U/L (ref 35–144)
BUN/Creatinine Ratio: 12 (calc) (ref 6–22)
BUN: 18 mg/dL (ref 7–25)
CO2: 35 mmol/L — ABNORMAL HIGH (ref 20–32)
Calcium: 10.5 mg/dL — ABNORMAL HIGH (ref 8.6–10.3)
Chloride: 100 mmol/L (ref 98–110)
Creat: 1.5 mg/dL — ABNORMAL HIGH (ref 0.70–1.25)
GFR, Est African American: 57 mL/min/{1.73_m2} — ABNORMAL LOW (ref >=60)
GFR, Est Non African American: 49 mL/min/{1.73_m2} — ABNORMAL LOW (ref >=60)
Globulin: 2.3 g/dL (calc) (ref 1.9–3.7)
Glucose, Bld: 136 mg/dL (ref 65–139)
Potassium: 4.1 mmol/L (ref 3.5–5.3)
Sodium: 140 mmol/L (ref 135–146)
Total Bilirubin: 0.4 mg/dL (ref 0.2–1.2)
Total Protein: 6.7 g/dL (ref 6.1–8.1)

## 2020-09-26 LAB — URINALYSIS, ROUTINE W REFLEX MICROSCOPIC
Bilirubin Urine: NEGATIVE
Glucose, UA: NEGATIVE
Hgb urine dipstick: NEGATIVE
Ketones, ur: NEGATIVE
Leukocytes,Ua: NEGATIVE
Nitrite: NEGATIVE
Protein, ur: NEGATIVE
Specific Gravity, Urine: 1.015 (ref 1.001–1.03)
pH: <=5 (ref 5.0–8.0)

## 2020-09-26 LAB — PSA: PSA: 4.83 ng/mL — ABNORMAL HIGH (ref <=4.0)

## 2020-09-27 ENCOUNTER — Encounter: Payer: Self-pay | Admitting: Family Medicine

## 2020-09-27 MED ORDER — TAMSULOSIN HCL 0.4 MG PO CAPS: 0.4000 mg | ORAL_CAPSULE | Freq: Every day | ORAL | 1 refills | Status: DC

## 2020-09-27 NOTE — Telephone Encounter (Signed)
Note sent to patient: Result note sent via MyChart:   Randall Fisher,   Your kidney function and PSA levels are abnormal but stable compared to previous lab tests. Urine sample looks ok. I would recommend trying flomax(tamsulosin) to see if this provides some relief of your symptoms.    CM   RX Pended, sign if appropriate.

## 2020-10-16 ENCOUNTER — Encounter: Payer: Self-pay | Admitting: Family Medicine

## 2020-10-16 ENCOUNTER — Other Ambulatory Visit: Payer: Self-pay

## 2020-10-16 ENCOUNTER — Ambulatory Visit (INDEPENDENT_AMBULATORY_CARE_PROVIDER_SITE_OTHER): Payer: 59 | Admitting: Family Medicine

## 2020-10-16 VITALS — BP 127/79 | HR 79 | Wt 233.0 lb

## 2020-10-16 DIAGNOSIS — N281 Cyst of kidney, acquired: Secondary | ICD-10-CM | POA: Insufficient documentation

## 2020-10-16 DIAGNOSIS — M47816 Spondylosis without myelopathy or radiculopathy, lumbar region: Secondary | ICD-10-CM | POA: Insufficient documentation

## 2020-10-16 DIAGNOSIS — M5459 Other low back pain: Secondary | ICD-10-CM | POA: Diagnosis not present

## 2020-10-16 DIAGNOSIS — R269 Unspecified abnormalities of gait and mobility: Secondary | ICD-10-CM | POA: Insufficient documentation

## 2020-10-16 DIAGNOSIS — F09 Unspecified mental disorder due to known physiological condition: Secondary | ICD-10-CM | POA: Insufficient documentation

## 2020-10-16 DIAGNOSIS — G3184 Mild cognitive impairment, so stated: Secondary | ICD-10-CM | POA: Diagnosis not present

## 2020-10-16 DIAGNOSIS — I209 Angina pectoris, unspecified: Secondary | ICD-10-CM | POA: Insufficient documentation

## 2020-10-16 MED ORDER — TAMSULOSIN HCL 0.4 MG PO CAPS: 0.4000 mg | ORAL_CAPSULE | Freq: Every day | ORAL | 1 refills | Status: DC

## 2020-10-16 MED ORDER — FENOFIBRATE 160 MG PO TABS: 160.0000 mg | ORAL_TABLET | Freq: Every day | ORAL | 1 refills | Status: DC

## 2020-10-16 MED ORDER — FUROSEMIDE 20 MG PO TABS
20.0000 mg | ORAL_TABLET | Freq: Every day | ORAL | 1 refills | Status: DC
Start: 2020-10-16 — End: 2021-03-09

## 2020-10-16 MED ORDER — ROSUVASTATIN CALCIUM 10 MG PO TABS: 10.0000 mg | ORAL_TABLET | Freq: Every day | ORAL | 1 refills | Status: DC

## 2020-10-16 MED ORDER — TADALAFIL 20 MG PO TABS
10.0000 mg | ORAL_TABLET | ORAL | 3 refills | Status: DC | PRN
Start: 2020-10-16 — End: 2021-09-25

## 2020-10-16 MED ORDER — OMEPRAZOLE 20 MG PO CPDR: 20.0000 mg | DELAYED_RELEASE_CAPSULE | Freq: Every day | ORAL | 1 refills | Status: DC

## 2020-10-16 NOTE — Assessment & Plan Note (Signed)
Currently on Aricept for mild cognitive impairment.  Requests referral to neurology for additional opinion on his neurological condition.  Referral placed to Dr. Posey Pronto per his request.

## 2020-10-16 NOTE — Assessment & Plan Note (Signed)
He continues to have pain despite injections as well as ablation.  Would like referral to spine surgeon for surgical opinion as spinal cord stimulator was denied by his insurance.  Discussed that if surgical options were not available we may need to pursue pain management referral.

## 2020-10-16 NOTE — Progress Notes (Signed)
Randall Fisher - 65 y.o. male MRN [id number]  Date of birth: [birth year]  Subjective Chief Complaint  Patient presents with  . referrals  . Back Pain    HPI Randall Fisher is a 65 year old male with history of hypertension, type 2 diabetes, history of lung cancer, depression, mild cognitive impairment, chronic low back pain and BPH.  He has been seeing Dr. Francesco Runner for management of his back pain.  Have history of spondylosis as well as facet pain syndrome.  He has had injection as well as radiofrequency ablation but continues to have pain.  He was suggested to have a spinal cord stimulator however this was declined by insurance.  He would like referral to spine surgeon to see if there are any further options for management of his pain.  He does have some radiation of his pain with weakness in his lower extremities is not sure if this was is related to his back or other neurological condition.  He would also like referral to another neurologist for second opinion.  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  He has had some difficulty with drooling and has noticed tremor at times.  He has had increasing difficulty with walking as well.  ROS:  A comprehensive ROS was completed and negative except as noted per HPI  Allergies  Allergen Reactions  . Mushroom Extract Complex Swelling  . Shellfish Allergy Swelling    THROAT SWELLED/ HANDS SWELLED.    Past Medical History:  Diagnosis Date  . Diabetes mellitus without complication ([place])   . Hyperlipidemia   . Joint pain   . Leg swelling   . Numbness and tingling in both hands   . S/P lobectomy of lung 2013   RUL for NSCLC    Past Surgical History:  Procedure Laterality Date  . LUNG LOBECTOMY Right 2013    Social History   Socioeconomic History  . Marital status: Married    Spouse name: Not on file  . Number of children: Not on file  . Years of education: Not on file  . Highest  education level: Not on file  Occupational History  . Not on file  Tobacco Use  . Smoking status: Never Smoker  . Smokeless tobacco: Never Used  Substance and Sexual Activity  . Alcohol use: Not Currently  . Drug use: Never  . Sexual activity: Yes    Partners: Female    Birth control/protection: Condom  Other Topics Concern  . Not on file  Social History Narrative  . Not on file   Social Determinants of Health   Financial Resource Strain: Not on file  Food Insecurity: Not on file  Transportation Needs: Not on file  Physical Activity: Not on file  Stress: Not on file  Social Connections: Not on file    Family History  Problem Relation Age of Onset  . Diabetes Mother   . Diabetes Father     Health Maintenance  Topic Date Due  . URINE MICROALBUMIN  Never done  . HEMOGLOBIN A1C  05/11/2020  . FOOT EXAM  09/16/2020  . OPHTHALMOLOGY EXAM  09/16/2020  . Hepatitis C Screening  11/30/2020 (Originally [birth year])  . COVID-19 Vaccine (4 - Booster for Moderna series) 02/03/2021  . TETANUS/TDAP  07/16/2026  . COLONOSCOPY (Pts 45-63yrs Insurance coverage will need to be confirmed)  08/17/2029  . INFLUENZA VACCINE  Completed  . PNEUMOCOCCAL POLYSACCHARIDE VACCINE AGE 52-64 HIGH RISK  Completed  .  HIV Screening  Discontinued     ----------------------------------------------------------------------------------------------------------------------------------------------------------------------------------------------------------------- Physical Exam BP 127/79 (BP Location: Left Arm, Patient Position: Sitting, Cuff Size: Large)   Pulse 79   Wt 233 lb (105.7 kg)   SpO2 96%   BMI 36.57 kg/m   Physical Exam Constitutional:      Appearance: Normal appearance.  HENT:     Head: Normocephalic and atraumatic.  Eyes:     General: No scleral icterus. Cardiovascular:     Rate and Rhythm: Normal rate and regular rhythm.  Pulmonary:     Effort: Pulmonary effort is normal.      Breath sounds: Normal breath sounds.  Skin:    General: Skin is warm and dry.  Neurological:     General: No focal deficit present.     Mental Status: He is alert.     Comments: Ambulates with Rollator walker.  Psychiatric:        Mood and Affect: Mood normal.        Behavior: Behavior normal.     ------------------------------------------------------------------------------------------------------------------------------------------------------------------------------------------------------------------- Assessment and Plan  Mild cognitive impairment Currently on Aricept for mild cognitive impairment.  Requests referral to neurology for additional opinion on his neurological condition.  Referral placed to Dr. Posey Pronto per his request.  Spondylosis without myelopathy or radiculopathy, lumbar region He continues to have pain despite injections as well as ablation.  Would like referral to spine surgeon for surgical opinion as spinal cord stimulator was denied by his insurance.  Discussed that if surgical options were not available we may need to pursue pain management referral.   Meds ordered this encounter  Medications  . fenofibrate 160 MG tablet    Sig: Take 1 tablet (160 mg total) by mouth daily.    Dispense:  90 tablet    Refill:  1  . furosemide (LASIX) 20 MG tablet    Sig: Take 1 tablet (20 mg total) by mouth daily.    Dispense:  90 tablet    Refill:  1  . omeprazole (PRILOSEC) 20 MG capsule    Sig: Take 1 capsule (20 mg total) by mouth daily.    Dispense:  90 capsule    Refill:  1  . rosuvastatin (CRESTOR) 10 MG tablet    Sig: Take 1 tablet (10 mg total) by mouth daily.    Dispense:  90 tablet    Refill:  1  . tadalafil (CIALIS) 20 MG tablet    Sig: Take 0.5-1 tablets (10-20 mg total) by mouth every other day as needed (ED). Replaces sildenafil as tadalafil recently approved.    Dispense:  45 tablet    Refill:  3  . tamsulosin (FLOMAX) 0.4 MG CAPS capsule    Sig: Take  1 capsule (0.4 mg total) by mouth daily.    Dispense:  90 capsule    Refill:  1    No follow-ups on file.    This visit occurred during the SARS-CoV-2 public health emergency.  Safety protocols were in place, including screening questions prior to the visit, additional usage of staff PPE, and extensive cleaning of exam room while observing appropriate contact time as indicated for disinfecting solutions.

## 2020-10-26 ENCOUNTER — Encounter: Payer: Self-pay | Admitting: Family Medicine

## 2020-10-26 ENCOUNTER — Telehealth (INDEPENDENT_AMBULATORY_CARE_PROVIDER_SITE_OTHER): Payer: 59 | Admitting: Family Medicine

## 2020-10-26 VITALS — Temp 97.6°F | Wt 228.0 lb

## 2020-10-26 DIAGNOSIS — M47816 Spondylosis without myelopathy or radiculopathy, lumbar region: Secondary | ICD-10-CM

## 2020-10-26 DIAGNOSIS — F09 Unspecified mental disorder due to known physiological condition: Secondary | ICD-10-CM | POA: Diagnosis not present

## 2020-10-26 DIAGNOSIS — R269 Unspecified abnormalities of gait and mobility: Secondary | ICD-10-CM | POA: Diagnosis not present

## 2020-10-26 DIAGNOSIS — B002 Herpesviral gingivostomatitis and pharyngotonsillitis: Secondary | ICD-10-CM | POA: Diagnosis not present

## 2020-10-26 MED ORDER — HYDROCODONE-ACETAMINOPHEN 5-325 MG PO TABS: 1.0000 | ORAL_TABLET | Freq: Four times a day (QID) | ORAL | 0 refills | Status: AC | PRN

## 2020-10-26 MED ORDER — VALACYCLOVIR HCL 500 MG PO TABS
500.0000 mg | ORAL_TABLET | Freq: Every day | ORAL | 3 refills | Status: DC
Start: 2020-10-26 — End: 2021-05-24

## 2020-10-26 NOTE — Progress Notes (Signed)
Wants to talk about meds and referrals, wouldn't say what referrals he needed.

## 2020-10-26 NOTE — Assessment & Plan Note (Signed)
Refill of valtrex sent in.

## 2020-10-26 NOTE — Assessment & Plan Note (Signed)
Referral placed to [facility] for second opinion for current neuro concerns.

## 2020-10-26 NOTE — Progress Notes (Signed)
Randall Fisher - 65 y.o. male MRN [id number]  Date of birth: 06/20/1956   This visit type was conducted due to national recommendations for restrictions regarding the COVID-19 Pandemic (e.g. social distancing).  This format is felt to be most appropriate for this patient at this time.  All issues noted in this document were discussed and addressed.  No physical exam was performed (except for noted visual exam findings with Video Visits).  I discussed the limitations of evaluation and management by telemedicine and the availability of in person appointments. The patient expressed understanding and agreed to proceed.  I connected with@ on 10/26/20 at 10:10 AM EST by a video enabled telemedicine application and verified that I am speaking with the correct person using two identifiers.  Present at visit: [name], DO [name]   Patient Location: Home [address] [place] [address]   Provider location:   [place]  No chief complaint on file.   HPI  Randall Fisher is a 65 y.o. male who presents via audio/video conferencing for a telehealth visit today.  Following up today on referrals placed at last visit.  He continues to have severe low back pain.  He was having injections but these became ineffective.  He was referred to spine surgeon however it appears that referral was routed to incorrect office.  He has not heard anything regarding this.    He was also referred to neurology due to cognitive and gait impairment.  He thinks he may have parkinson's and has seen [facility] but would like second opinion.      ROS:  A comprehensive ROS was completed and negative except as noted per HPI  Past Medical History:  Diagnosis Date   Diabetes mellitus without complication (HCC)    Hyperlipidemia    Joint pain    Leg swelling    Numbness and tingling in both hands    S/P lobectomy of lung 2013   RUL for NSCLC    Past Surgical History:  Procedure Laterality  Date   LUNG LOBECTOMY Right 2013    Family History  Problem Relation Age of Onset   Diabetes Mother    Diabetes Father     Social History   Socioeconomic History   Marital status: Married    Spouse name: Not on file   Number of children: Not on file   Years of education: Not on file   Highest education level: Not on file  Occupational History   Not on file  Tobacco Use   Smoking status: Never Smoker   Smokeless tobacco: Never Used  Substance and Sexual Activity   Alcohol use: Not Currently   Drug use: Never   Sexual activity: Yes    Partners: Female    Birth control/protection: Condom  Other Topics Concern   Not on file  Social History Narrative   Not on file   Social Determinants of Health   Financial Resource Strain: Not on file  Food Insecurity: Not on file  Transportation Needs: Not on file  Physical Activity: Not on file  Stress: Not on file  Social Connections: Not on file  Intimate Partner Violence: Not on file     Current Outpatient Medications:    HYDROcodone-acetaminophen (NORCO) 5-325 MG tablet, Take 1 tablet by mouth every 6 (six) hours as needed for up to 5 days for moderate pain., Disp: 20 tablet, Rfl: 0   valACYclovir (VALTREX) 500 MG tablet, Take 1 tablet (500 mg total) by  mouth daily., Disp: 90 tablet, Rfl: 3   albuterol (PROVENTIL) (2.5 MG/3ML) 0.083% nebulizer solution, Take 3 mLs (2.5 mg total) by nebulization every 6 (six) hours as needed for wheezing or shortness of breath., Disp: 150 mL, Rfl: 1   atropine 1 % ophthalmic solution, 2 drops daily. SUBLINGUAL., Disp: , Rfl:    budesonide (PULMICORT) 0.5 MG/2ML nebulizer solution, Take 0.5 mg by nebulization daily as needed., Disp: , Rfl:    donepezil (ARICEPT) 5 MG tablet, Take 1 tablet (5 mg total) by mouth at bedtime., Disp: 90 tablet, Rfl: 1   Dulaglutide (TRULICITY) 1.5 ER/7.4YC SOPN, INJECT 1.5 MG(0.5ML) UNDER THE SKIN ONCE A WEEK., Disp: , Rfl:    fenofibrate 160 MG  tablet, Take 1 tablet (160 mg total) by mouth daily., Disp: 90 tablet, Rfl: 1   fexofenadine-pseudoephedrine (ALLEGRA-D) 60-120 MG 12 hr tablet, Take 1 tablet by mouth daily as needed., Disp: , Rfl:    Fluticasone-Salmeterol (ADVAIR) 250-50 MCG/DOSE AEPB, Inhale into the lungs., Disp: , Rfl:    furosemide (LASIX) 20 MG tablet, Take 1 tablet (20 mg total) by mouth daily., Disp: 90 tablet, Rfl: 1   haloperidol (HALDOL) 0.5 MG tablet, Take 0.5 mg by mouth 2 (two) times daily., Disp: , Rfl:    insulin degludec (TRESIBA FLEXTOUCH) 200 UNIT/ML FlexTouch Pen, Inject 120 Units into the skin. , Disp: , Rfl:    Insulin Pen Needle (BD PEN NEEDLE NANO U/F) 32G X 4 MM MISC, USE WITH LEVEMIR FLEXTOUCH DAILY, Disp: , Rfl:    magnesium oxide (MAG-OX) 400 MG tablet, 1/2 tab(s), Disp: , Rfl:    Misc. Devices MISC, DX: Severe sleep apnea Start bipap 16/12 cm. Water with mask and supplies, Disp: , Rfl:    Multiple Vitamins-Minerals (MULTIVITAMIN ADULT, MINERALS, PO), Take 1 tablet by mouth., Disp: , Rfl:    NONFORMULARY OR COMPOUNDED ITEM, Please provide rolling walker.  Diagnosis: Gait instability, Disp: 1 each, Rfl: 0   nystatin-triamcinolone (MYCOLOG II) cream, Apply topically., Disp: , Rfl:    omeprazole (PRILOSEC) 20 MG capsule, Take 1 capsule (20 mg total) by mouth daily., Disp: 90 capsule, Rfl: 1   pregabalin (LYRICA) 200 MG capsule, Take 200 mg by mouth 2 (two) times daily. , Disp: , Rfl:    Probiotic Product (PROBIOTIC PO), Take 1 tablet by mouth daily., Disp: , Rfl:    rosuvastatin (CRESTOR) 10 MG tablet, Take 1 tablet (10 mg total) by mouth daily., Disp: 90 tablet, Rfl: 1   sertraline (ZOLOFT) 50 MG tablet, Take 50 mg by mouth daily., Disp: , Rfl:    tadalafil (CIALIS) 20 MG tablet, Take 0.5-1 tablets (10-20 mg total) by mouth every other day as needed (ED). Replaces sildenafil as tadalafil recently approved., Disp: 45 tablet, Rfl: 3   tamsulosin (FLOMAX) 0.4 MG CAPS capsule, Take 1  capsule (0.4 mg total) by mouth daily., Disp: 90 capsule, Rfl: 1  EXAM:  VITALS per patient if applicable: Temp 14.4 F (36.4 C)    Wt 228 lb (103.4 kg)    BMI 35.79 kg/m   GENERAL: alert, oriented, appears well and in no acute distress  HEENT: atraumatic, conjunttiva clear, no obvious abnormalities on inspection of external nose and ears  NECK: normal movements of the head and neck  LUNGS: on inspection no signs of respiratory distress, breathing rate appears normal, no obvious gross SOB, gasping or wheezing  CV: no obvious cyanosis  MS: moves all visible extremities without noticeable abnormality  PSYCH/NEURO: pleasant and cooperative, no obvious  depression or anxiety, speech and thought processing grossly intact  ASSESSMENT AND PLAN:  Discussed the following assessment and plan:  Herpetic gingivostomatitis Refill of valtrex sent in.   Mild cognitive disorder Referral placed to [facility] for second opinion for current neuro concerns.   Spondylosis without myelopathy or radiculopathy, lumbar region He has had multiple injections without improvement.  Spinal stimulator denied by insurance.  Referral re-routed to ortho spine surgeon to discuss any potential surgical options.  May need referral to pain management if no surgical options.  I did send short term norco 5/325mg  for pain control for now.       I discussed the assessment and treatment plan with the patient. The patient was provided an opportunity to ask questions and all were answered. The patient agreed with the plan and demonstrated an understanding of the instructions.   The patient was advised to call back or seek an in-person evaluation if the symptoms worsen or if the condition fails to improve as anticipated.    [name], DO

## 2020-10-26 NOTE — Assessment & Plan Note (Signed)
He has had multiple injections without improvement.  Spinal stimulator denied by insurance.  Referral re-routed to ortho spine surgeon to discuss any potential surgical options.  May need referral to pain management if no surgical options.  I did send short term norco 5/325mg  for pain control for now.

## 2021-01-02 ENCOUNTER — Ambulatory Visit (INDEPENDENT_AMBULATORY_CARE_PROVIDER_SITE_OTHER): Payer: 59 | Admitting: Neurology

## 2021-01-02 ENCOUNTER — Other Ambulatory Visit: Payer: Self-pay

## 2021-01-02 ENCOUNTER — Encounter: Payer: Self-pay | Admitting: Neurology

## 2021-01-02 VITALS — BP 132/74 | HR 80 | Ht 67.0 in | Wt 243.0 lb

## 2021-01-02 DIAGNOSIS — R269 Unspecified abnormalities of gait and mobility: Secondary | ICD-10-CM | POA: Insufficient documentation

## 2021-01-02 DIAGNOSIS — G20 Parkinson's disease: Secondary | ICD-10-CM

## 2021-01-02 MED ORDER — CARBIDOPA-LEVODOPA 25-100 MG PO TABS: 1.5000 | ORAL_TABLET | Freq: Three times a day (TID) | ORAL | 11 refills | Status: DC

## 2021-01-02 NOTE — Progress Notes (Addendum)
Chief Complaint  Patient presents with  . New Patient (Initial Visit)    MoCA: 29/30. He is here with his wife, [name], to have his memory loss evaluated (slow changes over time). He is also concerned about having Parkinson's Disease. Reports gait changes, excessive drooling, speech difficulty and shaking. His psychiatrist placed him on Sinemet 25-100mg  TID - his speech, drooling and shaking improved after starting the medication. He also has chronic back pain contributing to his gait difficulty.       ASSESSMENT AND PLAN  Randall Fisher is a 65 y.o. 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  MRI brain on February 29 2020:  1. No acute findings appreciated.  2. Mild senescent changes.    MRI lumbar in June 2021,  1. Multilevel DDD and facet arthrosis. At L5-S1, there is a small broad-based left paracentral disc protrusion that encroaches on the traversing left S1 nerve root. Please correlate with left-sided radicular symptoms. There is mild to moderate effacement of the thecal sac at L3-4 and mild spinal canal stenosis at L4-5. Mild bilateral foraminal stenosis L5-S1. Please see detail above.  2. No acute fracture..   Laboratory evaluation from [facility], March 2022, CMP showed mild elevated glucose 148, LDL 77, A1c 8.0 in September 2021, 6.5 on December 06, 2020   HISTORICAL  Randall Fisher, is a 65 year old male, seen in request by his primary care physician Dr. Zigmund Daniel, [facility] for elevation of gait abnormality, initial evaluation was  with his wife on January 02, 2021  I reviewed and summarized the referring note. PMHX. Right lobectomy for lung cancer, in 2013, chemotherapy. DM, insulin dependent Depression, anxiety since Oct 2021, by psychiatrist Dr. Lorrene Reid.  Used to be a heavy smoker, stopped since his lung cancer treatment  He used to work as a Audiological scientist, manage [employer], in September 2019, shortly after his mother passed away, he set into deep depression,  he began to notice difficulty handling his job, which he used to be very good at, also noticed mild gait abnormality, dragging left leg, drooling, stuttering speech, tremor of his hands,  His position was eventually eliminated during pandemic in May 2020, he has remained unemployeed since,  His health condition continue to deteriorate, he noticed slow worsening memory loss, gait abnormality, occasional fall, low back pain,  Had MRI of brain and the lumbar spine at [facility],  MRI of lumbar showed multilevel degenerative changes, left paracentral disc protrusion at L5-S1, encroaching on left S1 nerve roots, mild to moderate effacement of thecal sac at L3-4, mild canal stenosis at L4-5.  MRI of brain showed  no acute abnormality,  He was seen by [facility] neurologist Dr. Cammy Brochure, put on Sinemet 25/100 mg 1 tablet 3 times a day, he is now taking at 830, 4 PM, and 9 PM before bed, he reported mild improvement, can move better, has less tremors  He reported difficulty on daily function, today he was found to have asymmetric parkinsonian features, left worse than right, gait abnormality, mild memory loss, Mini-Mental Status  Examination 29/30,   REVIEW OF SYSTEMS:  Full 14 system review of systems performed and notable only for as above All other review of systems were negative.  PHYSICAL EXAM:   Vitals:   01/02/21 1525  BP: 132/74  Pulse: 80  Weight: 243 lb (110.2 kg)  Height: 5\' 7"  (1.702 m)   Not recorded      Body mass index is 38.06 kg/m.  PHYSICAL EXAMNIATION:  Gen: NAD, conversant, well nourised, well groomed                     Cardiovascular: Regular rate rhythm, no peripheral edema, warm, nontender. Eyes: Conjunctivae clear without exudates or hemorrhage Neck: Supple, no carotid bruits. Pulmonary: Clear to auscultation bilaterally   NEUROLOGICAL EXAM:  MENTAL STATUS: Speech:    Speech is normal; fluent and spontaneous with normal comprehension.  Montreal Cognitive Assessment  01/02/2021  Visuospatial/ Executive (0/5) 4  Naming (0/3) 3  Attention: Read list of digits (0/2) 2  Attention: Read list of letters (0/1) 1  Attention: Serial 7 subtraction starting at 100 (0/3) 3  Language: Repeat phrase (0/2) 2  Language : Fluency (0/1) 1  Abstraction (0/2) 2  Delayed Recall (0/5) 5  Orientation (0/6) 6  Total 29  Adjusted Score (based on education) 29     CRANIAL NERVES: CN II: Visual fields are full to confrontation. Pupils are round equal and briskly reactive to light. CN III, IV, VI: extraocular movement are normal. No ptosis. CN V: Facial sensation is intact to light touch CN VII: Face is symmetric with normal eye closure  CN VIII: Hearing is normal to causal conversation. CN IX, X: Phonation is normal. CN XI: Head turning and shoulder shrug are intact  MOTOR: No muscle weakness, mild left hand resting tremor, fixation of left arm on rapid rotating movement, left more than right upper and lower extremity rigidity, bradykinesia  REFLEXES: Reflexes are 2+ and symmetric at the biceps, triceps, knees, and ankles. Plantar responses are flexor.  SENSORY: Intact to light touch, pinprick and vibratory sensation are intact in fingers and toes.  COORDINATION: There is no trunk or limb dysmetria noted.  GAIT/STANCE: Need push-up to get up from seated position, decreased left arm swing, dragging left leg across the floor  ALLERGIES: Allergies  Allergen Reactions  .  Mushroom Extract Complex Swelling  . Shellfish Allergy Swelling    THROAT SWELLED/ HANDS SWELLED.    HOME MEDICATIONS: Current Outpatient Medications  Medication Sig Dispense Refill  . albuterol (PROVENTIL) (2.5 MG/3ML) 0.083% nebulizer solution Take 3 mLs (2.5 mg total) by nebulization every 6 (six) hours as needed for wheezing or shortness of breath. 150 mL 1  . atropine 1 % ophthalmic solution 2 drops daily. SUBLINGUAL.    . budesonide (PULMICORT) 0.5 MG/2ML nebulizer solution Take 0.5 mg by nebulization daily as needed.    . carbidopa-levodopa (SINEMET IR) 25-100 MG tablet Take 1 tablet by mouth 3 (three) times daily.    Marland Kitchen donepezil (ARICEPT) 5 MG tablet Take 1 tablet (5 mg total) by mouth at bedtime. 90 tablet 1  . Dulaglutide (TRULICITY) 1.5 YN/8.2NF SOPN INJECT 1.5 MG(0.5ML) UNDER THE SKIN ONCE A WEEK.    . fenofibrate 160 MG tablet Take 1 tablet (160 mg total) by mouth daily. 90 tablet 1  . fexofenadine-pseudoephedrine (ALLEGRA-D) 60-120 MG 12 hr tablet Take 1 tablet by mouth daily as needed.    . Fluticasone-Salmeterol (ADVAIR) 250-50 MCG/DOSE AEPB Inhale into the lungs.    Marland Kitchen  furosemide (LASIX) 20 MG tablet Take 1 tablet (20 mg total) by mouth daily. 90 tablet 1  . insulin degludec (TRESIBA FLEXTOUCH) 200 UNIT/ML FlexTouch Pen Inject 120 Units into the skin.     . Insulin Pen Needle (BD PEN NEEDLE NANO U/F) 32G X 4 MM MISC USE WITH LEVEMIR FLEXTOUCH DAILY    . latanoprost (XALATAN) 0.005 % ophthalmic solution Place 1 drop into both eyes at bedtime.    . Misc. Devices MISC DX: Severe sleep apnea Start bipap 16/12 cm. Water with mask and supplies    . Multiple Vitamins-Minerals (MULTIVITAMIN ADULT, MINERALS, PO) Take 1 tablet by mouth.    . NONFORMULARY OR COMPOUNDED ITEM Please provide rolling walker.  Diagnosis: Gait instability 1 each 0  . nystatin-triamcinolone (MYCOLOG II) cream Apply topically.    Marland Kitchen omeprazole (PRILOSEC) 20 MG capsule Take 1 capsule (20 mg total) by mouth  daily. 90 capsule 1  . pregabalin (LYRICA) 200 MG capsule Take 200 mg by mouth 2 (two) times daily.     . Probiotic Product (PROBIOTIC PO) Take 1 tablet by mouth daily.    . rosuvastatin (CRESTOR) 10 MG tablet Take 1 tablet (10 mg total) by mouth daily. 90 tablet 1  . sertraline (ZOLOFT) 50 MG tablet Take 50 mg by mouth daily.    . tadalafil (CIALIS) 20 MG tablet Take 0.5-1 tablets (10-20 mg total) by mouth every other day as needed (ED). Replaces sildenafil as tadalafil recently approved. 45 tablet 3  . tamsulosin (FLOMAX) 0.4 MG CAPS capsule Take 1 capsule (0.4 mg total) by mouth daily. 90 capsule 1  . valACYclovir (VALTREX) 500 MG tablet Take 1 tablet (500 mg total) by mouth daily. 90 tablet 3   No current facility-administered medications for this visit.    PAST MEDICAL HISTORY: Past Medical History:  Diagnosis Date  . Back pain   . Diabetes mellitus without complication ([place])   . Hyperlipidemia   . Joint pain   . Kidney disease   . Leg swelling   . Lung cancer ([place])   . Memory loss   . Neuropathy   . Numbness and tingling in both hands   . S/P lobectomy of lung 2013   RUL for NSCLC    PAST SURGICAL HISTORY: Past Surgical History:  Procedure Laterality Date  . CARPAL TUNNEL RELEASE Bilateral   . LUNG LOBECTOMY Right 2013  . TONSILLECTOMY      FAMILY HISTORY: Family History  Problem Relation Age of Onset  . Diabetes Mother   . Kidney failure Father     SOCIAL HISTORY: Social History   Socioeconomic History  . Marital status: Married    Spouse name: Not on file  . Number of children: 1  . Years of education: some college  . Highest education level: Not on file  Occupational History  . Occupation: unemployed - laid off  . Occupation: seeking disability  Tobacco Use  . Smoking status: Former Research scientist (life sciences)  . Smokeless tobacco: Never Used  Substance and Sexual Activity  . Alcohol use: Not Currently  . Drug use: Never  . Sexual activity: Yes    Partners: Female     Birth control/protection: Condom  Other Topics Concern  . Not on file  Social History Narrative   Lives at home with his wife.   1-2 cups caffeine per day.   Right-handed.      Social Determinants of Health   Financial Resource Strain: Not on file  Food Insecurity: Not on file  Transportation Needs: Not on file  Physical Activity: Not on file  Stress: Not on file  Social Connections: Not on file  Intimate Partner Violence: Not on file      Marcial Pacas, M.D. Ph.D.  [facility] [address], [place], [place] [address] Ph: [phone] Fax: [phone]  CC:  [name], [facility] [place]  [place],  [place] [address]  [name], DO

## 2021-01-10 ENCOUNTER — Telehealth (INDEPENDENT_AMBULATORY_CARE_PROVIDER_SITE_OTHER): Payer: 59 | Admitting: Medical-Surgical

## 2021-01-10 ENCOUNTER — Encounter: Payer: Self-pay | Admitting: Medical-Surgical

## 2021-01-10 DIAGNOSIS — B349 Viral infection, unspecified: Secondary | ICD-10-CM

## 2021-01-10 NOTE — Progress Notes (Signed)
Virtual Visit via Video Note  I [name] on 01/10/21 at  3:00 PM EDT by a video enabled telemedicine application and verified that I am speaking with the correct person using two identifiers.   I discussed the limitations of evaluation and management by telemedicine and the availability of in person appointments. The patient expressed understanding and agreed to proceed.  Patient location: home Provider locations: office  Subjective:    CC: Viral symptoms  HPI: Pleasant 65 year old male presenting via MyChart video visit with complaints of viral symptoms including chills, weakness, body aches, rhinorrhea, sinus congestion, and a cough productive of small amounts of brown sputum that started last night.  Notes that he and his wife kept their grandson over the weekend and he was sick with a double ear infection as well as pinkeye.  Denies fevers, chest pain, shortness of breath, and GI symptoms.  He has tried taking Tylenol which helps a little they have COVID test available but have not done them yet.  Past medical history, Surgical history, Family history not pertinant except as noted below, Social history, Allergies, and medications have been entered into the medical record, reviewed, and corrections made.   Review of Systems: See HPI for pertinent positives and negatives.   Objective:    General: Speaking clearly in complete sentences without any shortness of breath.  Alert and oriented x3.  Normal judgment. No apparent acute distress.  Impression and Recommendations:    1. Viral illness Sudden onset of upper respiratory symptoms consistent with a viral illness similar to flu or COVID.  Recommend COVID testing and discussed the appropriate use of COVID home test.  Advised should the test come back positive, he should call and let us know as he may be a candidate for the oral antiviral treatment.  Recommend symptomatic management with over-the-counter cold and flu  preparations, warm fluids, rest, Tylenol, and/or ibuprofen.  Discussed the expectation for improvement and resolution of viral symptoms approximately 10 days.  If symptoms are not improving or return for evaluation at that time.  I discussed the assessment and treatment plan with the patient. The patient was provided an opportunity to ask questions and all were answered. The patient agreed with the plan and demonstrated an understanding of the instructions.   The patient was advised to call back or seek an in-person evaluation if the symptoms worsen or if the condition fails to improve as anticipated.  20 minutes of non-face-to-face time was provided during this encounter.  Return if symptoms worsen or fail to improve.  Clearnce Sorrel, DNP, APRN, FNP-BC [facility]

## 2021-01-16 ENCOUNTER — Encounter: Payer: Self-pay | Admitting: Family Medicine

## 2021-01-16 ENCOUNTER — Telehealth (INDEPENDENT_AMBULATORY_CARE_PROVIDER_SITE_OTHER): Payer: 59 | Admitting: Family Medicine

## 2021-01-16 DIAGNOSIS — J329 Chronic sinusitis, unspecified: Secondary | ICD-10-CM

## 2021-01-16 DIAGNOSIS — J40 Bronchitis, not specified as acute or chronic: Secondary | ICD-10-CM | POA: Diagnosis not present

## 2021-01-16 MED ORDER — FLOVENT HFA 44 MCG/ACT IN AERO: 2.0000 | INHALATION_SPRAY | Freq: Two times a day (BID) | RESPIRATORY_TRACT | 12 refills | Status: DC

## 2021-01-16 MED ORDER — AMOXICILLIN-POT CLAVULANATE 875-125 MG PO TABS: 1.0000 | ORAL_TABLET | Freq: Two times a day (BID) | ORAL | 0 refills | Status: AC

## 2021-01-16 NOTE — Patient Instructions (Signed)
Please schedule an in-person visit if you are not starting to improve after a few days of antibiotics. I hope you feel better soon!

## 2021-01-16 NOTE — Progress Notes (Signed)
Virtual Video Visit via MyChart Note  I connected with  Randall Fisher on 01/16/21 at  2:00 PM EDT by the video enabled telemedicine application for MyChart, and verified that I am speaking with the correct person using two identifiers.   I introduced myself as a Designer, jewellery with the practice. We discussed the limitations of evaluation and management by telemedicine and the availability of in person appointments. The patient expressed understanding and agreed to proceed.  Participating parties in this visit include: The patient and the nurse practitioner listed.  The patient is: At home I am: In the office - Primary Care Randall Fisher  Subjective:    CC:  Chief Complaint  Patient presents with  . URI    HPI: Randall Fisher is a 65 y.o. year old male presenting today via [place] today for worsening URI symptoms.  Patient reporting symptoms began a little over a week ago.  He saw Joy, FNP here at the office last week and supportive measures were encouraged plus a COVID test.  He says he has had 2 negative COVID test since that appointment.  They  had previously been around grandchildren who had been sick.  He reports that his symptoms have progressively worsened.  He is extremely weak, fatigued, having body aches, productive cough with yellow sputum, 5 out of 10 frontal headache, shortness of breath with some wheezing and chest tightness.  Reports that he has started to use his albuterol inhaler which has helped shortness of breath.  He is taking Mucinex, Allegra-D, Tylenol.  However symptoms continue to gradually worsen.  He denies any chest pain, pain with inspiration, ear pain, confusion, bowel or bladder changes, loss of taste/smell, dizziness, vision changes.  Reports he is trying to eat and stay well-hydrated, but states he could definitely improve in that area.  He has been vaccinated against COVID.  Past medical history, Surgical history, Family history not pertinant except as  noted below, Social history, Allergies, and medications have been entered into the medical record, reviewed, and corrections made.   Review of Systems:  All review of systems negative except what is listed in the HPI   Objective:    General:  Speaking clearly in complete sentences. Absent shortness of breath noted.   Alert and oriented x3.   Normal judgment.  Absent acute distress.   Impression and Recommendations:    1. Sinobronchitis Given that his symptoms are progressively worsening, we will go ahead and treat as a bacterial sinobronchitis with Augmentin.  No acute distress today. For his shortness of breath, wheezing, chest tightness I would like to try some daily Flovent with the albuterol as needed.  Can consider oral steroids, but would prefer inhaled first since he is a diabetic.  Supportive measures encouraged including over-the-counter cough and cold medicines, Tylenol, Mucinex, rest, hydration, humidifier use, warm liquids, honey, steam showers.  Patient educated on signs and symptoms requiring further evaluation.  If symptoms persist after a few days of antibiotics, would like him to be seen in person so we can listen to his lungs and have a thorough evaluation.  Patient agreeable to plan.  - amoxicillin-clavulanate (AUGMENTIN) 875-125 MG tablet; Take 1 tablet by mouth 2 (two) times daily for 5 days.  Dispense: 10 tablet; Refill: 0 - fluticasone (FLOVENT HFA) 44 MCG/ACT inhaler; Inhale 2 puffs into the lungs in the morning and at bedtime.  Dispense: 1 each; Refill: 12    Follow-up if symptoms worsen or fail to improve.  I discussed the assessment and treatment plan with the patient. The patient was provided an opportunity to ask questions and all were answered. The patient agreed with the plan and demonstrated an understanding of the instructions.   The patient was advised to call back or seek an in-person evaluation if the symptoms worsen or if the condition fails to  improve as anticipated.  I provided 20 minutes of non-face-to-face interaction with this [place] visit including intake, same-day documentation, and chart review.   Randall Saver, NP

## 2021-03-09 ENCOUNTER — Other Ambulatory Visit: Payer: Self-pay | Admitting: Family Medicine

## 2021-04-03 ENCOUNTER — Encounter: Payer: Self-pay | Admitting: Neurology

## 2021-04-03 ENCOUNTER — Ambulatory Visit (INDEPENDENT_AMBULATORY_CARE_PROVIDER_SITE_OTHER): Payer: 59 | Admitting: Neurology

## 2021-04-03 VITALS — BP 121/62 | HR 113 | Ht 65.0 in | Wt 240.5 lb

## 2021-04-03 DIAGNOSIS — G20 Parkinson's disease: Secondary | ICD-10-CM | POA: Diagnosis not present

## 2021-04-03 DIAGNOSIS — R269 Unspecified abnormalities of gait and mobility: Secondary | ICD-10-CM

## 2021-04-03 MED ORDER — CARBIDOPA-LEVODOPA 25-100 MG PO TABS: 2.0000 | ORAL_TABLET | Freq: Three times a day (TID) | ORAL | 11 refills | Status: DC

## 2021-04-03 MED ORDER — CARBIDOPA-LEVODOPA 25-100 MG PO TABS: 2.0000 | ORAL_TABLET | Freq: Three times a day (TID) | ORAL | 4 refills | Status: DC

## 2021-04-03 NOTE — Progress Notes (Signed)
Chief Complaint  Patient presents with   Follow-up    Medication check New Room, wife [name] in room      ASSESSMENT AND PLAN  AADAM Fisher is a 65 y.o. 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, he seems to have more left side involvement,  MRI of the brain showed no significant structural abnormality  Will titrating Sinemet to 25/100 mg 2 tablets 3 times a day  Ordered DaTSCAN  High co-pay with physical therapy, suggested patient walking regularly,  Low back pain  MRI of lumbar spine in June 2021 from [place] showed multilevel mild degenerative changes, worst at L5-S1, with variable degree of foraminal narrowing,  Have suggested him warm compression, back stretching exercise  DIAGNOSTIC DATA (LABS, IMAGING, TESTING) - I reviewed patient records, labs, notes, testing and imaging myself where available. MRI brain on February 29 2020:  1.  No acute findings appreciated.  2.  Mild senescent changes.    MRI lumbar in June 2021,  1.  Multilevel DDD and facet arthrosis. At L5-S1, there is a small broad-based left paracentral disc protrusion that encroaches on the traversing left S1 nerve root. Please correlate with left-sided radicular symptoms. There is mild to moderate effacement of the thecal sac at L3-4 and mild spinal canal stenosis at L4-5. Mild bilateral foraminal stenosis L5-S1. Please see detail above.  2.  No acute fracture..   Laboratory evaluation from [name], March 2022, CMP showed mild elevated glucose 148, LDL 77, A1c 8.0 in September 2021, 6.5 on December 06, 2020   HISTORICAL  Randall Fisher, is a 65 year old male, seen in request by his primary care physician Dr. Zigmund Daniel, [facility] [place] for elevation of gait abnormality, initial  evaluation was with his wife on January 02, 2021  I reviewed and summarized the referring note. PMHX. Right lobectomy for lung cancer, in 2013, chemotherapy. DM, insulin dependent Depression, anxiety since Oct 2021, by psychiatrist Dr. Lorrene Reid.  Used to be a heavy smoker, stopped since his lung cancer treatment  He used to work as a Audiological scientist, manage [employer], in September 2019, shortly after his mother passed away, he set into deep depression,  he began to notice difficulty handling his job, which he used to be very good at, also noticed mild gait abnormality, dragging left leg, drooling, stuttering speech, tremor of his hands,  His position was eventually eliminated during pandemic in May 2020, he has remained unemployeed since,  His health condition continue to deteriorate, he noticed slow worsening memory loss, gait abnormality, occasional fall, low back pain,  Had MRI of brain and the lumbar spine at [facility],  MRI of lumbar showed multilevel degenerative changes, left paracentral disc protrusion at L5-S1, encroaching on left S1 nerve roots, mild to moderate effacement of thecal sac at L3-4, mild canal stenosis at L4-5.  MRI of brain showed  no acute abnormality,  He was seen by [facility] neurologist Dr. Cammy Brochure, put on Sinemet 25/100 mg 1 tablet 3 times a day, he is now taking at 830, 4 PM, and 9 PM before bed, he reported mild improvement, can move better, has less tremors  He reported difficulty on daily function, today he was found to have asymmetric parkinsonian features, left worse than right, gait abnormality, mild memory loss, Mini-Mental  Status Examination 29/30,  UPDATE April 03 2021: He is accompanied by [name] at today's visit, tolerating Sinemet 25/100 mg, is now taking 1 and half tablets 3 times a day at 10 AM, 2 PM, 6 PM, no significant side effect, he reported minor improvement, seems to tolerate better, less  stuttering, there was no significant side effect noted, but he tends to miss his second dose  He spent most of the time sitting in his recliner, is very frustrated about his limited mobility, continued decline, he also complains of midline low back pain     REVIEW OF SYSTEMS:  Full 14 system review of systems performed and notable only for as above All other review of systems were negative.  PHYSICAL EXAM:   Vitals:   04/03/21 1441  BP: 121/62  Pulse: (!) 113  Weight: 240 lb 8 oz (109.1 kg)  Height: 5\' 5"  (1.651 m)   Not recorded     Body mass index is 40.02 kg/m.  PHYSICAL EXAMNIATION:  Gen: NAD, conversant, well nourised, well groomed               NEUROLOGICAL EXAM:  MENTAL STATUS: Speech: Soft voice, occasionally stuttering, normal comprehension, Montreal Cognitive Assessment  01/02/2021  Visuospatial/ Executive (0/5) 4  Naming (0/3) 3  Attention: Read list of digits (0/2) 2  Attention: Read list of letters (0/1) 1  Attention: Serial 7 subtraction starting at 100 (0/3) 3  Language: Repeat phrase (0/2) 2  Language : Fluency (0/1) 1  Abstraction (0/2) 2  Delayed Recall (0/5) 5  Orientation (0/6) 6  Total 29  Adjusted Score (based on education) 29     CRANIAL NERVES: CN II: Visual fields are full to confrontation. Pupils are round equal and briskly reactive to light. CN III, IV, VI: extraocular movement are normal. No ptosis. CN V: Facial sensation is intact to light touch CN VII: Face is symmetric with normal eye closure  CN VIII: Hearing is normal to causal conversation. CN IX, X: Phonation is normal. CN XI: Head turning and shoulder shrug are intact  MOTOR: mild left hand resting tremor, fixation of left arm on rapid rotating movement, left more than right upper and lower extremity rigidity, bradykinesia  REFLEXES: Reflexes are 2+ and symmetric at the biceps, triceps, knees, and ankles. Plantar responses are flexor.  SENSORY: Intact to light  touch, pinprick and vibratory sensation are intact in fingers and toes.  COORDINATION: There is no trunk or limb dysmetria noted.  GAIT/STANCE: Need push-up to get up from seated position, decreased left arm swing, dragging left leg across the floor  ALLERGIES: Allergies  Allergen Reactions   Mushroom Extract Complex Swelling   Shellfish Allergy Swelling    THROAT SWELLED/ HANDS SWELLED.    HOME MEDICATIONS: Current Outpatient Medications  Medication Sig Dispense Refill   albuterol (PROVENTIL) (2.5 MG/3ML) 0.083% nebulizer solution Take 3 mLs (2.5 mg total) by nebulization every 6 (six) hours as needed for wheezing or shortness of breath. 150 mL 1   atropine 1 % ophthalmic solution 2 drops daily. SUBLINGUAL.     budesonide (PULMICORT) 0.5 MG/2ML nebulizer solution Take 0.5 mg by nebulization daily as needed.     carbidopa-levodopa (SINEMET IR) 25-100 MG tablet Take 1.5 tablets by mouth 3 (three) times daily. 100 tablet 11   donepezil (ARICEPT) 5 MG tablet Take 1 tablet (5 mg total) by mouth at bedtime. 90 tablet 1   Dulaglutide (TRULICITY) 1.5 HQ/4.6NG SOPN INJECT 1.5 MG(0.5ML) UNDER THE  SKIN ONCE A WEEK.     fenofibrate 160 MG tablet TAKE 1 TABLET BY MOUTH ONCE A DAY 90 tablet 1   fexofenadine-pseudoephedrine (ALLEGRA-D) 60-120 MG 12 hr tablet Take 1 tablet by mouth daily as needed.     fluticasone (FLOVENT HFA) 44 MCG/ACT inhaler Inhale 2 puffs into the lungs in the morning and at bedtime. 1 each 12   furosemide (LASIX) 20 MG tablet TAKE 1 TABLET BY MOUTH ONCE A DAY 90 tablet 1   insulin degludec (TRESIBA FLEXTOUCH) 200 UNIT/ML FlexTouch Pen Inject 120 Units into the skin.      Insulin Pen Needle (BD PEN NEEDLE NANO U/F) 32G X 4 MM MISC USE WITH LEVEMIR FLEXTOUCH DAILY     latanoprost (XALATAN) 0.005 % ophthalmic solution Place 1 drop into both eyes at bedtime.     Misc. Devices MISC DX: Severe sleep apnea Start bipap 16/12 cm. Water with mask and supplies     Multiple  Vitamins-Minerals (MULTIVITAMIN ADULT, MINERALS, PO) Take 1 tablet by mouth.     NONFORMULARY OR COMPOUNDED ITEM Please provide rolling walker.  Diagnosis: Gait instability 1 each 0   nystatin-triamcinolone (MYCOLOG II) cream Apply topically.     omeprazole (PRILOSEC) 20 MG capsule TAKE 1 CAPSULE BY MOUTH ONCE A DAY 90 capsule 1   pregabalin (LYRICA) 200 MG capsule Take 200 mg by mouth 2 (two) times daily.      Probiotic Product (PROBIOTIC PO) Take 1 tablet by mouth daily.     rosuvastatin (CRESTOR) 10 MG tablet TAKE 1 TABLET BY MOUTH ONCE A DAY 90 tablet 1   sertraline (ZOLOFT) 50 MG tablet Take 50 mg by mouth daily.     tadalafil (CIALIS) 20 MG tablet Take 0.5-1 tablets (10-20 mg total) by mouth every other day as needed (ED). Replaces sildenafil as tadalafil recently approved. 45 tablet 3   tamsulosin (FLOMAX) 0.4 MG CAPS capsule Take 1 capsule (0.4 mg total) by mouth daily. 90 capsule 1   valACYclovir (VALTREX) 500 MG tablet Take 1 tablet (500 mg total) by mouth daily. 90 tablet 3   No current facility-administered medications for this visit.    PAST MEDICAL HISTORY: Past Medical History:  Diagnosis Date   Back pain    Diabetes mellitus without complication (HCC)    Hyperlipidemia    Joint pain    Kidney disease    Leg swelling    Lung cancer (HCC)    Memory loss    Neuropathy    Numbness and tingling in both hands    S/P lobectomy of lung 2013   RUL for NSCLC    PAST SURGICAL HISTORY: Past Surgical History:  Procedure Laterality Date   CARPAL TUNNEL RELEASE Bilateral    LUNG LOBECTOMY Right 2013   TONSILLECTOMY      FAMILY HISTORY: Family History  Problem Relation Age of Onset   Diabetes Mother    Kidney failure Father     SOCIAL HISTORY: Social History   Socioeconomic History   Marital status: Married    Spouse name: [name]   Number of children: 1   Years of education: some college   Highest education level: Not on file  Occupational History   Occupation:  unemployed - laid off   Occupation: seeking disability  Tobacco Use   Smoking status: Former   Smokeless tobacco: Never  Substance and Sexual Activity   Alcohol use: Not Currently   Drug use: Never   Sexual activity: Yes    Partners: Female  Birth control/protection: Condom  Other Topics Concern   Not on file  Social History Narrative   Lives at home with his wife.   1-2 cups caffeine per day.   Right-handed.      Social Determinants of Health   Financial Resource Strain: Not on file  Food Insecurity: Not on file  Transportation Needs: Not on file  Physical Activity: Not on file  Stress: Not on file  Social Connections: Not on file  Intimate Partner Violence: Not on file      Marcial Pacas, M.D. Ph.D.  [facility] [address] [place], [place] [address] Ph: [phone] Fax: [phone]  CC:  [name], [place] [facility]  [address] [place],  [place] [address]  [name], DO

## 2021-04-04 ENCOUNTER — Encounter: Payer: Self-pay | Admitting: Neurology

## 2021-04-05 ENCOUNTER — Telehealth (INDEPENDENT_AMBULATORY_CARE_PROVIDER_SITE_OTHER): Payer: 59 | Admitting: Medical-Surgical

## 2021-04-05 ENCOUNTER — Telehealth: Payer: Self-pay | Admitting: Neurology

## 2021-04-05 ENCOUNTER — Encounter: Payer: Self-pay | Admitting: Medical-Surgical

## 2021-04-05 VITALS — Temp 98.4°F

## 2021-04-05 DIAGNOSIS — J40 Bronchitis, not specified as acute or chronic: Secondary | ICD-10-CM | POA: Diagnosis not present

## 2021-04-05 DIAGNOSIS — J329 Chronic sinusitis, unspecified: Secondary | ICD-10-CM | POA: Diagnosis not present

## 2021-04-05 MED ORDER — AZITHROMYCIN 250 MG PO TABS: ORAL_TABLET | ORAL | 0 refills | Status: AC

## 2021-04-05 MED ORDER — METHYLPREDNISOLONE 4 MG PO TBPK: ORAL_TABLET | ORAL | 0 refills | Status: DC

## 2021-04-05 NOTE — Telephone Encounter (Signed)
Called patient regarding obtaining signature for [employer] consent form. He asked that I e-mail that to him. He will sign it and send it back to me.

## 2021-04-05 NOTE — Progress Notes (Signed)
Virtual Visit via Video Note  I [name] on 04/05/21 at  9:30 AM EDT by a video enabled telemedicine application and verified that I am speaking with the correct person using two identifiers.   I discussed the limitations of evaluation and management by telemedicine and the availability of in person appointments. The patient expressed understanding and agreed to proceed.  Patient location: home Provider locations: office  Subjective:    CC: Upper respiratory symptoms  HPI: Pleasant 65 year old male presenting via [place] video visit with reports of 1 week of upper respiratory symptoms including cough, chest congestion, sinus congestion, facial pain, fatigue, and decreased appetite.  He is coughing up greenish-brown thick mucus.  Has not had any fever or chills and is able to eat and drink without difficulty.  No nausea, vomiting, diarrhea.  He does have frequent upper respiratory infection and is prone to getting bronchitis.  He went to [facility] yesterday and had a completed to evaluate for COVID.  He has not received those results yet.  Past medical history, Surgical history, Family history not pertinant except as noted below, Social history, Allergies, and medications have been entered into the medical record, reviewed, and corrections made.   Review of Systems: See HPI for pertinent positives and negatives.   Objective:    General: Speaking clearly in complete sentences without any shortness of breath.  Alert and oriented x3.  Normal judgment. No apparent acute distress.  Impression and Recommendations:    1. Sinobronchitis Since he has had symptoms for approximately 1 week, we will begin treat for sinobronchitis with azithromycin and methylprednisolone.  If COVID test is positive, recommend notifying us through [place] or telephone call so we can update our records.  He is past the window for using the oral antiviral options but this will allow [place] to monitor for any  long COVID symptoms.  Continue Allegra-D and Mucinex if desired.  Eat small frequent meals and aim to increase fluid intake as tolerated.   I discussed the assessment and treatment plan with the patient. The patient was provided an opportunity to ask questions and all were answered. The patient agreed with the plan and demonstrated an understanding of the instructions.   The patient was advised to call back or seek an in-person evaluation if the symptoms worsen or if the condition fails to improve as anticipated.  20 minutes of non-face-to-face time was provided during this encounter.  Return if symptoms worsen or fail to improve.  Clearnce Sorrel, DNP, APRN, FNP-BC [facility]

## 2021-04-16 ENCOUNTER — Telehealth: Payer: Self-pay | Admitting: Neurology

## 2021-04-16 ENCOUNTER — Telehealth: Payer: Self-pay

## 2021-04-16 MED ORDER — TRAMADOL HCL 50 MG PO TABS: 50.0000 mg | ORAL_TABLET | Freq: Four times a day (QID) | ORAL | 1 refills | Status: DC | PRN

## 2021-04-16 NOTE — Telephone Encounter (Signed)
Agree with disposition. 

## 2021-04-16 NOTE — Telephone Encounter (Signed)
I called the patient and talk with the wife.  The patient is being treated for parkinsonism, he also has a history of chronic low back pain.  He has had recent worsening his ability to walk in part related to heightened the back pain but he recently was discovered to have a COVID infection.  He is being treated for this currently, he is at the hospital emergency room in [place].  The wife is asking for a pain medication to take to help the back pain to increase his mobility.  I will send in a small prescription for Ultram at this time.

## 2021-04-16 NOTE — Telephone Encounter (Signed)
[name] called and states Randall Fisher is very weak and short of breath. He tested positive this morning for Covid. I advised her to take him to the ED.

## 2021-04-16 NOTE — Telephone Encounter (Signed)
Pt's wife, Tara Wich (on [place]) was told to notify physician if he can not walk without medication. Would like a call from the nurse.

## 2021-04-20 ENCOUNTER — Encounter: Payer: Self-pay | Admitting: Family Medicine

## 2021-04-20 ENCOUNTER — Telehealth (INDEPENDENT_AMBULATORY_CARE_PROVIDER_SITE_OTHER): Payer: 59 | Admitting: Family Medicine

## 2021-04-20 DIAGNOSIS — U071 COVID-19: Secondary | ICD-10-CM

## 2021-04-20 MED ORDER — BENZONATATE 200 MG PO CAPS: 200.0000 mg | ORAL_CAPSULE | Freq: Two times a day (BID) | ORAL | 1 refills | Status: DC | PRN

## 2021-04-22 DIAGNOSIS — U071 COVID-19: Secondary | ICD-10-CM | POA: Insufficient documentation

## 2021-04-22 NOTE — Assessment & Plan Note (Addendum)
Recommend continued supportive care with increased fluids, rest, inhaled corticosteroids and bronchodilators.  Recommend that he try scheduling albuterol every 4-6 hours for the next 24 hours.  Adding [employer] as well.  Instructed to contact the clinic if having new or worsening symptoms.

## 2021-04-22 NOTE — Progress Notes (Signed)
Randall Fisher - 65 y.o. male MRN [id number]  Date of birth: 1956/08/07   This visit type was conducted due to national recommendations for restrictions regarding the COVID-19 Pandemic (e.g. social distancing).  This format is felt to be most appropriate for this patient at this time.  All issues noted in this document were discussed and addressed.  No physical exam was performed (except for noted visual exam findings with Video Visits).  I discussed the limitations of evaluation and management by telemedicine and the availability of in person appointments. The patient expressed understanding and agreed to proceed.  I connected withNAME@ on 04/22/21 at 11:30 AM EDT by a video enabled telemedicine application and verified that I am speaking with the correct person using two identifiers.  Present at visit: Randall Nutting, DO Randall Fisher   Patient Location: Home [address] [place] [address]   Provider location:   [facility]  Chief Complaint  Patient presents with   Hospitalization Follow-up    HPI  Randall Fisher is a 65 y.o. male who presents via audio/video conferencing for a telehealth visit today.  Randall Fisher is a 65 year old male here today for follow-up of recent ED visit.  He recently tested positive for COVID and was seen in the ED due to fatigue and dehydration.  He was given fluids and sent home with Pulmicort and Tussionex.  Reports that overall he is feeling better however continues to have some fatigue.  He does continue to cough as well.  He does have improvement with Pulmicort as well as Tussionex but has breakthrough coughing despite this.  He denies shortness of breath.  He has remained afebrile.   ROS:  A comprehensive ROS was completed and negative except as noted per HPI  Past Medical History:  Diagnosis Date   Back pain    Diabetes mellitus without complication (HCC)    Hyperlipidemia    Joint pain    Kidney disease    Leg swelling    Lung cancer (HCC)     Memory loss    Neuropathy    Numbness and tingling in both hands    S/P lobectomy of lung 2013   RUL for NSCLC    Past Surgical History:  Procedure Laterality Date   CARPAL TUNNEL RELEASE Bilateral    LUNG LOBECTOMY Right 2013   TONSILLECTOMY      Family History  Problem Relation Age of Onset   Diabetes Mother    Kidney failure Father     Social History   Socioeconomic History   Marital status: Married    Spouse name: Randall Fisher   Number of children: 1   Years of education: some college   Highest education level: Not on file  Occupational History   Occupation: unemployed - laid off   Occupation: seeking disability  Tobacco Use   Smoking status: Former   Smokeless tobacco: Never  Substance and Sexual Activity   Alcohol use: Not Currently   Drug use: Never   Sexual activity: Yes    Partners: Female    Birth control/protection: Condom  Other Topics Concern   Not on file  Social History Narrative   Lives at home with his wife.   1-2 cups caffeine per day.   Right-handed.      Social Determinants of Health   Financial Resource Strain: Not on file  Food Insecurity: Not on file  Transportation Needs: Not on file  Physical Activity: Not on file  Stress: Not on file  Social Connections: Not on file  Intimate Partner Violence: Not on file     Current Outpatient Medications:    albuterol (PROVENTIL) (2.5 MG/3ML) 0.083% nebulizer solution, Take 3 mLs (2.5 mg total) by nebulization every 6 (six) hours as needed for wheezing or shortness of breath., Disp: 150 mL, Rfl: 1   atropine 1 % ophthalmic solution, 2 drops daily. SUBLINGUAL., Disp: , Rfl:    benzonatate (TESSALON) 200 MG capsule, Take 1 capsule (200 mg total) by mouth 2 (two) times daily as needed for cough., Disp: 20 capsule, Rfl: 1   budesonide (PULMICORT) 0.5 MG/2ML nebulizer solution, Take 0.5 mg by nebulization daily as needed., Disp: , Rfl:    carbidopa-levodopa (SINEMET IR) 25-100 MG tablet, Take 2  tablets by mouth 3 (three) times daily., Disp: 540 tablet, Rfl: 4   donepezil (ARICEPT) 5 MG tablet, Take 1 tablet (5 mg total) by mouth at bedtime., Disp: 90 tablet, Rfl: 1   Dulaglutide (TRULICITY) 1.5 IP/3.8SN SOPN, INJECT 1.5 MG(0.5ML) UNDER THE SKIN ONCE A WEEK., Disp: , Rfl:    fenofibrate 160 MG tablet, TAKE 1 TABLET BY MOUTH ONCE A DAY, Disp: 90 tablet, Rfl: 1   fexofenadine-pseudoephedrine (ALLEGRA-D) 60-120 MG 12 hr tablet, Take 1 tablet by mouth daily as needed., Disp: , Rfl:    fluticasone (FLOVENT HFA) 44 MCG/ACT inhaler, Inhale 2 puffs into the lungs in the morning and at bedtime., Disp: 1 each, Rfl: 12   furosemide (LASIX) 20 MG tablet, TAKE 1 TABLET BY MOUTH ONCE A DAY, Disp: 90 tablet, Rfl: 1   insulin degludec (TRESIBA FLEXTOUCH) 200 UNIT/ML FlexTouch Pen, Inject 120 Units into the skin. , Disp: , Rfl:    Insulin Pen Needle (BD PEN NEEDLE NANO U/F) 32G X 4 MM MISC, USE WITH LEVEMIR FLEXTOUCH DAILY, Disp: , Rfl:    latanoprost (XALATAN) 0.005 % ophthalmic solution, Place 1 drop into both eyes at bedtime., Disp: , Rfl:    Misc. Devices MISC, DX: Severe sleep apnea Start bipap 16/12 cm. Water with mask and supplies, Disp: , Rfl:    Multiple Vitamins-Minerals (MULTIVITAMIN ADULT, MINERALS, PO), Take 1 tablet by mouth., Disp: , Rfl:    nystatin-triamcinolone (MYCOLOG II) cream, Apply topically., Disp: , Rfl:    omeprazole (PRILOSEC) 20 MG capsule, TAKE 1 CAPSULE BY MOUTH ONCE A DAY, Disp: 90 capsule, Rfl: 1   pregabalin (LYRICA) 200 MG capsule, Take 200 mg by mouth 2 (two) times daily. , Disp: , Rfl:    Probiotic Product (PROBIOTIC PO), Take 1 tablet by mouth daily., Disp: , Rfl:    rosuvastatin (CRESTOR) 10 MG tablet, TAKE 1 TABLET BY MOUTH ONCE A DAY, Disp: 90 tablet, Rfl: 1   sertraline (ZOLOFT) 50 MG tablet, Take 50 mg by mouth daily., Disp: , Rfl:    tadalafil (CIALIS) 20 MG tablet, Take 0.5-1 tablets (10-20 mg total) by mouth every other day as needed (ED). Replaces sildenafil as  tadalafil recently approved., Disp: 45 tablet, Rfl: 3   tamsulosin (FLOMAX) 0.4 MG CAPS capsule, Take 1 capsule (0.4 mg total) by mouth daily., Disp: 90 capsule, Rfl: 1   valACYclovir (VALTREX) 500 MG tablet, Take 1 tablet (500 mg total) by mouth daily., Disp: 90 tablet, Rfl: 3   NONFORMULARY OR COMPOUNDED ITEM, Please provide rolling walker.  Diagnosis: Gait instability, Disp: 1 each, Rfl: 0   traMADol (ULTRAM) 50 MG tablet, Take 1 tablet (50 mg total) by mouth every 6 (six) hours as needed. (Patient not taking: Reported on 04/20/2021), Disp:  40 tablet, Rfl: 1  EXAM:  VITALS per patient if applicable: Temp 21.2 F (36.7 C) (Oral)   Wt 240 lb (108.9 kg)   SpO2 94% Comment: ON RA  BMI 39.94 kg/m   GENERAL: alert, oriented, appears well and in no acute distress  HEENT: atraumatic, conjunttiva clear, no obvious abnormalities on inspection of external nose and ears  NECK: normal movements of the head and neck  LUNGS: on inspection no signs of respiratory distress, breathing rate appears normal, no obvious gross SOB, gasping or wheezing  CV: no obvious cyanosis  MS: moves all visible extremities without noticeable abnormality  PSYCH/NEURO: pleasant and cooperative, no obvious depression or anxiety, speech and thought processing grossly intact  ASSESSMENT AND PLAN:  Discussed the following assessment and plan:  COVID-19 Recommend continued supportive care with increased fluids, rest, inhaled corticosteroids and bronchodilators.  Recommend that he try scheduling albuterol every 4-6 hours for the next 24 hours.  Adding [employer] as well.  Instructed to contact the clinic if having new or worsening symptoms.     I discussed the assessment and treatment plan with the patient. The patient was provided an opportunity to ask questions and all were answered. The patient agreed with the plan and demonstrated an understanding of the instructions.   The patient was advised to call back or  seek an in-person evaluation if the symptoms worsen or if the condition fails to improve as anticipated.    Randall Nutting, DO

## 2021-05-02 ENCOUNTER — Telehealth: Payer: Self-pay | Admitting: Neurology

## 2021-05-02 NOTE — Telephone Encounter (Signed)
Pt called stating that he is needing to speak to the RN regarding a form that is being worked on. Please advise.

## 2021-05-02 NOTE — Telephone Encounter (Signed)
Patient called in asking for the email with the release form that he needs to sign for the DAT scan.  Verified email address is   [email or web address]  Sent to [employer] for follow up.

## 2021-05-03 NOTE — Telephone Encounter (Signed)
I spoke with patient, he states he thinks he may have accidentally deleted the consent form e-mail. I sent it again and he confirmed that he received it.

## 2021-05-04 ENCOUNTER — Encounter: Payer: Self-pay | Admitting: Family Medicine

## 2021-05-04 ENCOUNTER — Ambulatory Visit (INDEPENDENT_AMBULATORY_CARE_PROVIDER_SITE_OTHER): Payer: 59

## 2021-05-04 ENCOUNTER — Ambulatory Visit (INDEPENDENT_AMBULATORY_CARE_PROVIDER_SITE_OTHER): Payer: 59 | Admitting: Family Medicine

## 2021-05-04 ENCOUNTER — Other Ambulatory Visit: Payer: Self-pay

## 2021-05-04 VITALS — BP 122/72 | HR 79 | Temp 98.5°F | Resp 18

## 2021-05-04 DIAGNOSIS — R059 Cough, unspecified: Secondary | ICD-10-CM

## 2021-05-04 DIAGNOSIS — R062 Wheezing: Secondary | ICD-10-CM

## 2021-05-04 DIAGNOSIS — K5900 Constipation, unspecified: Secondary | ICD-10-CM

## 2021-05-04 MED ORDER — PREDNISONE 20 MG PO TABS: 40.0000 mg | ORAL_TABLET | Freq: Every day | ORAL | 0 refills | Status: AC

## 2021-05-04 NOTE — Patient Instructions (Addendum)
Cough and breathing: Short prednisone burst Continue using nebulizers; try being consistent with the budesonide daily for the next 4-5 days at least and albuterol as needed Chest xray today - we will update you when results are back and if we need to make any changes Go to the ED if: high fevers, lethargy, confusion, difficulty breathing, coughing up blood, decreased urination or other signs of severe dehydration, chest pain, etc.     CONSTIPATION:  ALL THE TIME:  Lifestyle measures Hydration: drink plenty of water Physical activity: at least walking daily High-fiber foods Bulk forming laxatives  Psyllium (Konsyl; Metamucil; Perdiem) Methylcellulose (Citrucel) Calcium polycarbophil (FiberCon; Fiber-Lax; Mitrolan) Wheat dextrin (Benefiber)  AS NEEDED FOR 3-5 DAYS AT A TIME OR ALL THE TIME 1-2 TIMES PER WEEK  (CAN TAKE ONE FROM EACH CATEGORY E.G. MIRALAX + SENNA) Hyperosmolar or saline laxatives: Polyethylene glycol (MiraLAX, GlycoLax) Lactulose Sorbitol Magnesium hydroxide (Milk of Magnesia)  Magnesium citrate (Evac-Q-Mag)  Stimulant laxatives Senna (eg, Black Draught, Ex-Lax, Fletcher's, Castoria, Senokot)  Bisacodyl (eg, Correctol, Doxidan, Dulcolax). Taking stimulant laxatives regularly or in large amounts can cause side effects, including low potassium levels. Thus, you should take these drugs carefully if you must use them regularly.  PRESCRIPTIONS that treat severe constipation. They are expensive, but may be recommended if you do not respond to other treatments. Lubiprostone (Amitiza) Linaclotide (Linzess) Plecanatide (Trulance, Motegrity) Tenapanor Orpah Cobb)

## 2021-05-04 NOTE — Progress Notes (Signed)
Acute Office Visit  Subjective:    Patient ID: Randall Fisher, male    DOB: 11-Jul-1956, 65 y.o.   MRN: [id number]  Chief Complaint  Patient presents with   Cough     HPI Patient is in today for cough, dyspnea, wheezing.  04/05/21 diagnosed with sinobronchitis - given steroid pack and zpak 04/16/21 COVID + He ended up going to the ED (received fluids) 04/20/21 video f/u with Dr. Zigmund Daniel and gave him some benzonatate    Today patient reports that his cough is continuing to linger. He is aware that this may last awhile due to having COVID recently, but he is becoming more concerned about the significant dyspnea, trouble catching his breath at times, weakness and easily fatigued, as well as occasional wheezing throughout the day. Reports his chest/ribs/sternum is slightly sore from all of the coughing. Reports the cough was dry when he first had COVID, but now it is more productive and he is bringing up white/tan sputum regularly. He has been taking mucinex for the past 2 weeks. States he can get so short of breath at times that he has trouble speaking a full sentence even without any exertion.   He is using PRN Pulmicort and albuterol nebs that were prescribed at his ED visit - states he gets some short term relief with this. Reports he has a mix of good days and bad days, today happens to be a good day, but yesterday was worse. He has fallen 3 times within the past week (being worked for [employer] by neurology).   He denies any recent fevers, chills, body aches, sinus pressure, ear pain/pressure, nasal congestion, rhinorrhea.    Additionally he mentions that he has been struggling with constipation for awhile. He averages about one BM per week and then needs a laxative to help him go. Wife states she just bought some Metamucil that they are going to try to use daily to see if this helps. Reports he does not eat a lot of fiber in his diet. Most recent BM was yesterday.        Past  Medical History:  Diagnosis Date   Back pain    Diabetes mellitus without complication (HCC)    Hyperlipidemia    Joint pain    Kidney disease    Leg swelling    Lung cancer ([place])    Memory loss    Neuropathy    Numbness and tingling in both hands    S/P lobectomy of lung 2013   RUL for NSCLC    Past Surgical History:  Procedure Laterality Date   CARPAL TUNNEL RELEASE Bilateral    LUNG LOBECTOMY Right 2013   TONSILLECTOMY      Family History  Problem Relation Age of Onset   Diabetes Mother    Kidney failure Father     Social History   Socioeconomic History   Marital status: Married    Spouse name: [name]   Number of children: 1   Years of education: some college   Highest education level: Not on file  Occupational History   Occupation: unemployed - laid off   Occupation: seeking disability  Tobacco Use   Smoking status: Former   Smokeless tobacco: Never  Substance and Sexual Activity   Alcohol use: Not Currently   Drug use: Never   Sexual activity: Yes    Partners: Female    Birth control/protection: Condom  Other Topics Concern   Not on file  Social History Narrative  Lives at home with his wife.   1-2 cups caffeine per day.   Right-handed.      Social Determinants of Health   Financial Resource Strain: Not on file  Food Insecurity: Not on file  Transportation Needs: Not on file  Physical Activity: Not on file  Stress: Not on file  Social Connections: Not on file  Intimate Partner Violence: Not on file    Outpatient Medications Prior to Visit  Medication Sig Dispense Refill   albuterol (PROVENTIL) (2.5 MG/3ML) 0.083% nebulizer solution Take 3 mLs (2.5 mg total) by nebulization every 6 (six) hours as needed for wheezing or shortness of breath. 150 mL 1   atropine 1 % ophthalmic solution 2 drops daily. SUBLINGUAL.     benzonatate (TESSALON) 200 MG capsule Take 1 capsule (200 mg total) by mouth 2 (two) times daily as needed for cough. 20 capsule 1    budesonide (PULMICORT) 0.5 MG/2ML nebulizer solution Take 0.5 mg by nebulization daily as needed.     carbidopa-levodopa (SINEMET IR) 25-100 MG tablet Take 2 tablets by mouth 3 (three) times daily. 540 tablet 4   donepezil (ARICEPT) 5 MG tablet Take 1 tablet (5 mg total) by mouth at bedtime. 90 tablet 1   Dulaglutide (TRULICITY) 1.5 SH/7.64YO SOPN INJECT 1.5 MG(0.5ML) UNDER THE SKIN ONCE A WEEK.     fenofibrate 160 MG tablet TAKE 1 TABLET BY MOUTH ONCE A DAY 90 tablet 1   fexofenadine-pseudoephedrine (ALLEGRA-D) 60-120 MG 12 hr tablet Take 1 tablet by mouth daily as needed.     fluticasone (FLOVENT HFA) 44 MCG/ACT inhaler Inhale 2 puffs into the lungs in the morning and at bedtime. 1 each 12   furosemide (LASIX) 20 MG tablet TAKE 1 TABLET BY MOUTH ONCE A DAY 90 tablet 1   insulin degludec (TRESIBA FLEXTOUCH) 200 UNIT/ML FlexTouch Pen Inject 120 Units into the skin.      Insulin Pen Needle (BD PEN NEEDLE NANO U/F) 32G X 4 MM MISC USE WITH LEVEMIR FLEXTOUCH DAILY     latanoprost (XALATAN) 0.005 % ophthalmic solution Place 1 drop into both eyes at bedtime.     Misc. Devices MISC DX: Severe sleep apnea Start bipap 16/12 cm. Water with mask and supplies     Multiple Vitamins-Minerals (MULTIVITAMIN ADULT, MINERALS, PO) Take 1 tablet by mouth.     NONFORMULARY OR COMPOUNDED ITEM Please provide rolling walker.  Diagnosis: Gait instability 1 each 0   nystatin-triamcinolone (MYCOLOG II) cream Apply topically.     omeprazole (PRILOSEC) 20 MG capsule TAKE 1 CAPSULE BY MOUTH ONCE A DAY 90 capsule 1   pregabalin (LYRICA) 200 MG capsule Take 200 mg by mouth 2 (two) times daily.      Probiotic Product (PROBIOTIC PO) Take 1 tablet by mouth daily.     rosuvastatin (CRESTOR) 10 MG tablet TAKE 1 TABLET BY MOUTH ONCE A DAY 90 tablet 1   sertraline (ZOLOFT) 50 MG tablet Take 50 mg by mouth daily.     tadalafil (CIALIS) 20 MG tablet Take 0.5-1 tablets (10-20 mg total) by mouth every other day as needed (ED).  Replaces sildenafil as tadalafil recently approved. 45 tablet 3   tamsulosin (FLOMAX) 0.4 MG CAPS capsule Take 1 capsule (0.4 mg total) by mouth daily. 90 capsule 1   traMADol (ULTRAM) 50 MG tablet Take 1 tablet (50 mg total) by mouth every 6 (six) hours as needed. (Patient not taking: Reported on 04/20/2021) 40 tablet 1   valACYclovir (VALTREX) 500 MG tablet  Take 1 tablet (500 mg total) by mouth daily. 90 tablet 3   No facility-administered medications prior to visit.    Allergies  Allergen Reactions   Mushroom Extract Complex Swelling   Shellfish Allergy Swelling    THROAT SWELLED/ HANDS SWELLED.    Review of Systems All review of systems negative except what is listed in the HPI     Objective:    Physical Exam Vitals reviewed.  Constitutional:      Appearance: Normal appearance.  HENT:     Head: Normocephalic and atraumatic.     Right Ear: Tympanic membrane normal.     Left Ear: Tympanic membrane normal.     Nose: Nose normal.     Mouth/Throat:     Mouth: Mucous membranes are moist.     Pharynx: Oropharynx is clear.  Cardiovascular:     Rate and Rhythm: Normal rate and regular rhythm.     Pulses: Normal pulses.     Heart sounds: Normal heart sounds.  Pulmonary:     Effort: Pulmonary effort is normal.     Breath sounds: No wheezing, rhonchi or rales.     Comments: Mildly diminished in bases Abdominal:     General: Bowel sounds are normal. There is no distension.     Palpations: Abdomen is soft.     Tenderness: There is no abdominal tenderness. There is no guarding.  Musculoskeletal:     Cervical back: Normal range of motion and neck supple.  Skin:    Capillary Refill: Capillary refill takes less than 2 seconds.  Neurological:     Mental Status: He is alert and oriented to person, place, and time. Mental status is at baseline.  Psychiatric:        Mood and Affect: Mood normal.        Behavior: Behavior normal.        Thought Content: Thought content normal.         Judgment: Judgment normal.    BP 122/72   Pulse 79   Temp 98.5 F (36.9 C)   Resp 18   SpO2 94%  Wt Readings from Last 3 Encounters:  04/20/21 240 lb (108.9 kg)  04/03/21 240 lb 8 oz (109.1 kg)  01/02/21 243 lb (110.2 kg)    Health Maintenance Due  Topic Date Due   URINE MICROALBUMIN  Never done   Hepatitis C Screening  Never done   Zoster Vaccines- Shingrix (1 of 2) Never done   Pneumococcal Vaccine 59-69 Years old (3 - PCV) 07/16/2017   HEMOGLOBIN A1C  05/11/2020   FOOT EXAM  09/16/2020   OPHTHALMOLOGY EXAM  09/16/2020   COVID-19 Vaccine (4 - Booster for Moderna series) 11/06/2020   INFLUENZA VACCINE  04/16/2021    There are no preventive care reminders to display for this patient.   No results found for: TSH Lab Results  Component Value Date   WBC 11.2 (H) 02/28/2020   HGB 12.5 (L) 02/28/2020   HCT 38.3 (L) 02/28/2020   MCV 82.5 02/28/2020   PLT 206 02/28/2020   Lab Results  Component Value Date   NA 140 09/25/2020   K 4.1 09/25/2020   CO2 35 (H) 09/25/2020   GLUCOSE 136 09/25/2020   BUN 18 09/25/2020   CREATININE 1.50 (H) 09/25/2020   BILITOT 0.4 09/25/2020   AST 19 09/25/2020   ALT 22 09/25/2020   PROT 6.7 09/25/2020   CALCIUM 10.5 (H) 09/25/2020   No results found for: CHOL No results  found for: HDL No results found for: LDLCALC No results found for: TRIG No results found for: [facility] Lab Results  Component Value Date   HGBA1C 6.7 11/12/2019       Assessment & Plan:   1. Cough 2. Wheezing Post-COVID cough vs new bacterial infection, however no fevers, tachypnea or tachycardia so less likely. However given duration and significance of reported SOB/DOE and wheezing, will get CXR to confirm no underlying illness. Will hold of on ABX until results of CXR or if any symptoms change. Giving short burst of steroids to see if this will help his breathing. Educated that cough can linger for quite a while following COVID. States he still has enough  Tessalon and does not wish for anything stronger at this time. Patient and wife agreeable with plan. Patient aware of signs/symptoms requiring further/urgent evaluation.  - DG Chest 2 View; Future - predniSONE (DELTASONE) 20 MG tablet; Take 2 tablets (40 mg total) by mouth daily with breakfast for 5 days.  Dispense: 10 tablet; Refill: 0  3. Constipation Information added to handout regarding constipation management including daily metamucil and miralax, senna, etc for as needed relief. Encouraged hydration and increasing dietary fiber intake.   Patient aware of signs/symptoms requiring further/urgent evaluation.  Follow-up as needed.   Purcell Nails Olevia Bowens, DNP, FNP-C

## 2021-05-07 NOTE — Telephone Encounter (Signed)
Received signed patient consent form. I filled out remaining paperwork and will give to MD to sign.

## 2021-05-07 NOTE — Progress Notes (Signed)
MyChart message sent: Your chest x-ray was normal! Let's see how you are feeling after the steroid burst.

## 2021-05-14 NOTE — Telephone Encounter (Signed)
Received approval from [facility] for [facility]. [place] #[id number] (05/15/21- 09/15/21). Sent to [employer] at [employer] for scheduling.

## 2021-05-22 ENCOUNTER — Telehealth: Payer: Self-pay | Admitting: Neurology

## 2021-05-22 NOTE — Telephone Encounter (Signed)
I spoke to the patient. He is concerned about the following symptoms: worsening speech, more falls, increased stiffness, weakness, fatigue. He has a DaTscan pending 05/25/21. He has continued taking generic Sinemet 25-100mg , two tablets TID. He is asking if he should be seen sooner than January or can medication adjustments be made over the phone.  Additionally, he said a referral was discussed at this last visit to [place] or [facility] (for second opinion). He will have Medicare after 07/17/21 and can be seen by any of these academic center with insurance coverage. \

## 2021-05-22 NOTE — Telephone Encounter (Signed)
Pt called states he wants to come in sooner than January. Says his symptoms are worsening. Pt requesting a call back.

## 2021-05-23 NOTE — Telephone Encounter (Signed)
It is Ok to give him an earlier followup

## 2021-05-23 NOTE — Telephone Encounter (Addendum)
I spoke to the patient's wife. She accepted an appt on 05/24/21 in an open slot. She will have him here at 11am for check-in.

## 2021-05-24 ENCOUNTER — Other Ambulatory Visit: Payer: Self-pay

## 2021-05-24 ENCOUNTER — Ambulatory Visit (INDEPENDENT_AMBULATORY_CARE_PROVIDER_SITE_OTHER): Payer: 59 | Admitting: Neurology

## 2021-05-24 ENCOUNTER — Encounter: Payer: Self-pay | Admitting: Neurology

## 2021-05-24 VITALS — BP 146/73 | HR 77 | Ht 69.0 in | Wt 240.0 lb

## 2021-05-24 DIAGNOSIS — R269 Unspecified abnormalities of gait and mobility: Secondary | ICD-10-CM | POA: Diagnosis not present

## 2021-05-24 DIAGNOSIS — G232 Striatonigral degeneration: Secondary | ICD-10-CM | POA: Diagnosis not present

## 2021-05-24 MED ORDER — RASAGILINE MESYLATE 1 MG PO TABS: 1.0000 mg | ORAL_TABLET | Freq: Every day | ORAL | 11 refills | Status: DC

## 2021-05-24 MED ORDER — ROPINIROLE HCL ER 2 MG PO TB24: 4.0000 mg | ORAL_TABLET | Freq: Every day | ORAL | 11 refills | Status: DC

## 2021-05-24 NOTE — Progress Notes (Addendum)
Chief Complaint  Patient presents with   Tremors    Work in patient, increased fatigue and weakness, feels his disease is progressing rapidly Room 16, daughter Randall Fisher in room       Randall Fisher is a 65 y.o. 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 Deboraha Fisher for second opinion  Also discussed with patient, with his continued decline, likely he will experience more difficulty, needing more care later  DIAGNOSTIC DATA (LABS, IMAGING, TESTING) - I reviewed patient records, labs, notes, testing and imaging myself where available. MRI brain on February 29 2020:  1.  No acute findings appreciated.  2.  Mild senescent changes.    MRI lumbar in June 2021,  1.  Multilevel DDD and facet arthrosis. At L5-S1, there is a small broad-based left paracentral disc protrusion that encroaches on the traversing left S1 nerve root. Please correlate with left-sided radicular symptoms. There is mild to moderate effacement of the thecal sac at L3-4 and mild spinal canal stenosis at L4-5. Mild bilateral foraminal stenosis L5-S1. Please see detail above.  2.  No acute fracture..   Laboratory evaluation from [facility], March 2022, CMP showed mild elevated glucose 148, LDL 77, A1c 8.0 in September 2021, 6.5 on December 06, 2020   HISTORICAL  Randall Fisher, is a 65 year old male, seen in request by his primary care physician Dr. Zigmund Fisher, Randall Fisher for elevation of  gait abnormality, initial evaluation was with his wife on January 02, 2021  I reviewed and summarized the referring note. PMHX. Right lobectomy for lung cancer, in 2013, chemotherapy. DM, insulin dependent Depression, anxiety since Oct 2021, by psychiatrist Dr. Lorrene Fisher.  Used to be a heavy smoker, stopped since his lung cancer treatment  He used to work as a Audiological scientist, manage [employer], in September 2019, shortly after his mother passed away, he set into deep depression,  he began to notice difficulty handling his job, which he used to be very good at, also noticed mild gait abnormality, dragging left leg, drooling, stuttering speech, tremor of his hands,  His position was eventually eliminated during pandemic in May 2020, he has remained unemployeed since,  His health condition continue to deteriorate, he noticed slow worsening memory loss, gait abnormality, occasional fall, low back pain,  Had MRI of brain and the lumbar spine at [facility],  MRI of lumbar showed multilevel degenerative changes, left paracentral disc protrusion at L5-S1, encroaching on left S1 nerve roots, mild to moderate effacement of thecal sac at L3-4, mild canal stenosis at L4-5.  MRI of brain showed  no acute abnormality,  He was seen by [facility] neurologist Dr. Cammy Fisher, put on Sinemet 25/100 mg 1 tablet 3 times a day, he is now taking at 830, 4 PM, and 9 PM before bed, he reported mild improvement, can move better, has less tremors  He reported difficulty on daily function, today he was found to have asymmetric  parkinsonian features, left worse than right, gait abnormality, mild memory loss, Mini-Mental Status Examination 29/30,  UPDATE April 03 2021: He is accompanied by his wife Randall Fisher at today's visit, tolerating Sinemet 25/100 mg, is now taking 1 and half tablets 3 times a day at 10 AM, 2 PM, 6 PM, no significant side effect, he reported minor improvement, seems to  tolerate better, less stuttering, there was no significant side effect noted, but he tends to miss his second dose  He spent most of the time sitting in his recliner, is very frustrated about his limited mobility, continued decline, he also complains of midline low back pain  UPDATE Sept 8 2022: Patient is brought in by his daughter-in-law at today's visit on May 24, 2021, he has rapid decline over the past few months, in good days, he can take few steps rely on his walker, but spent most of the time in a sitting down position, sleep a lot of times, remains depressed,  He is able to tolerate Sinemet 25/100 mg 2 tablets 3 times a day, but higher dose caused increased sleepiness, in addition, he has developed vivid dreams, some decreased sense of smell loss, lightheadedness when getting up, worsening urinary urgency, incontinence, wearing depends when sleep, and worsening constipations,  He has obstructive sleep apnea, using BiPAP machine,  REVIEW OF SYSTEMS:  Full 14 system review of systems performed and notable only for as above All other review of systems were negative.  PHYSICAL EXAM:   Vitals:   05/24/21 1112  BP: (!) 146/73  Pulse: 77  Weight: 240 lb (108.9 kg)  Height: 5\' 9"  (1.753 m)   Not recorded     Body mass index is 35.44 kg/m.  PHYSICAL EXAMNIATION:  Gen: NAD, conversant, well nourised, well groomed               NEUROLOGICAL EXAM:  MENTAL STATUS: Speech: Soft voice, occasionally stuttering, normal comprehension, decreased facial expression Montreal Cognitive Assessment  01/02/2021  Visuospatial/ Executive (0/5) 4  Naming (0/3) 3  Attention: Read list of digits (0/2) 2  Attention: Read list of letters (0/1) 1  Attention: Serial 7 subtraction starting at 100 (0/3) 3  Language: Repeat phrase (0/2) 2  Language : Fluency (0/1) 1  Abstraction (0/2) 2  Delayed Recall (0/5) 5  Orientation (0/6) 6  Total 29  Adjusted Score (based on education) 29      CRANIAL NERVES: CN II: Visual fields are full to confrontation. Pupils are round equal and briskly reactive to light. CN III, IV, VI: extraocular movement are normal. No ptosis. CN V: Facial sensation is intact to light touch CN VII: Face is symmetric with normal eye closure  CN VIII: Hearing is normal to causal conversation. CN IX, X: Phonation is normal. CN XI: Head turning and shoulder shrug are intact  MOTOR: mild left hand resting tremor, fixation of left arm on rapid rotating movement, left more than right upper and lower extremity rigidity, bradykinesia  REFLEXES: Reflexes are 1  and symmetric at the biceps, triceps, knees, and ankles. Plantar responses are flexor.  SENSORY: Intact to light touch, pinprick and vibratory sensation are intact in fingers and toes.  COORDINATION: There is no trunk or limb dysmetria noted.  GAIT/STANCE: Need push-up to get up from seated position, decreased left arm swing, dragging left leg across the floor, en bloc turning, retropulse instability  ALLERGIES: Allergies  Allergen Reactions   Mushroom Extract Complex Swelling   Shellfish Allergy Swelling  THROAT SWELLED/ HANDS SWELLED.    HOME MEDICATIONS: Current Outpatient Medications  Medication Sig Dispense Refill   albuterol (PROVENTIL) (2.5 MG/3ML) 0.083% nebulizer solution Take 3 mLs (2.5 mg total) by nebulization every 6 (six) hours as needed for wheezing or shortness of breath. 150 mL 1   benzonatate (TESSALON) 200 MG capsule Take 1 capsule (200 mg total) by mouth 2 (two) times daily as needed for cough. 20 capsule 1   budesonide (PULMICORT) 0.5 MG/2ML nebulizer solution Take 0.5 mg by nebulization daily as needed.     carbidopa-levodopa (SINEMET IR) 25-100 MG tablet Take 2 tablets by mouth 3 (three) times daily. 540 tablet 4   donepezil (ARICEPT) 5 MG tablet Take 1 tablet (5 mg total) by mouth at bedtime. 90 tablet 1   Dulaglutide (TRULICITY) 1.5 YW/7.3XT SOPN INJECT 1.5  MG(0.5ML) UNDER THE SKIN ONCE A WEEK.     fenofibrate 160 MG tablet TAKE 1 TABLET BY MOUTH ONCE A DAY 90 tablet 1   fexofenadine-pseudoephedrine (ALLEGRA-D) 60-120 MG 12 hr tablet Take 1 tablet by mouth daily as needed.     fluticasone (FLOVENT HFA) 44 MCG/ACT inhaler Inhale 2 puffs into the lungs in the morning and at bedtime. 1 each 12   furosemide (LASIX) 20 MG tablet TAKE 1 TABLET BY MOUTH ONCE A DAY 90 tablet 1   insulin degludec (TRESIBA FLEXTOUCH) 200 UNIT/ML FlexTouch Pen Inject 120 Units into the skin.      Insulin Pen Needle (BD PEN NEEDLE NANO U/F) 32G X 4 MM MISC USE WITH LEVEMIR FLEXTOUCH DAILY     latanoprost (XALATAN) 0.005 % ophthalmic solution Place 1 drop into both eyes at bedtime.     Misc. Devices MISC DX: Severe sleep apnea Start bipap 16/12 cm. Water with mask and supplies     Multiple Vitamins-Minerals (MULTIVITAMIN ADULT, MINERALS, PO) Take 1 tablet by mouth.     NONFORMULARY OR COMPOUNDED ITEM Please provide rolling walker.  Diagnosis: Gait instability 1 each 0   nystatin-triamcinolone (MYCOLOG II) cream Apply topically.     omeprazole (PRILOSEC) 20 MG capsule TAKE 1 CAPSULE BY MOUTH ONCE A DAY 90 capsule 1   pregabalin (LYRICA) 200 MG capsule Take 200 mg by mouth 2 (two) times daily.      Probiotic Product (PROBIOTIC PO) Take 1 tablet by mouth daily.     rosuvastatin (CRESTOR) 10 MG tablet TAKE 1 TABLET BY MOUTH ONCE A DAY 90 tablet 1   sertraline (ZOLOFT) 50 MG tablet Take 50 mg by mouth daily.     tadalafil (CIALIS) 20 MG tablet Take 0.5-1 tablets (10-20 mg total) by mouth every other day as needed (ED). Replaces sildenafil as tadalafil recently approved. 45 tablet 3   tamsulosin (FLOMAX) 0.4 MG CAPS capsule Take 1 capsule (0.4 mg total) by mouth daily. 90 capsule 1   traMADol (ULTRAM) 50 MG tablet Take 1 tablet (50 mg total) by mouth every 6 (six) hours as needed. 40 tablet 1   No current facility-administered medications for this visit.    PAST MEDICAL  HISTORY: Past Medical History:  Diagnosis Date   Back pain    Diabetes mellitus without complication (HCC)    Hyperlipidemia    Joint pain    Kidney disease    Leg swelling    Lung cancer ([place])    Memory loss    Neuropathy    Numbness and tingling in both hands    S/P lobectomy of lung 2013   RUL for NSCLC  PAST SURGICAL HISTORY: Past Surgical History:  Procedure Laterality Date   CARPAL TUNNEL RELEASE Bilateral    LUNG LOBECTOMY Right 2013   TONSILLECTOMY      FAMILY HISTORY: Family History  Problem Relation Age of Onset   Diabetes Mother    Kidney failure Father     SOCIAL HISTORY: Social History   Socioeconomic History   Marital status: Married    Spouse name: Randall Fisher   Number of children: 1   Years of education: some college   Highest education level: Not on file  Occupational History   Occupation: unemployed - laid off   Occupation: seeking disability  Tobacco Use   Smoking status: Former   Smokeless tobacco: Never  Substance and Sexual Activity   Alcohol use: Not Currently   Drug use: Never   Sexual activity: Yes    Partners: Female    Birth control/protection: Condom  Other Topics Concern   Not on file  Social History Narrative   Lives at home with his wife.   1-2 cups caffeine per day.   Right-handed.      Social Determinants of Health   Financial Resource Strain: Not on file  Food Insecurity: Not on file  Transportation Needs: Not on file  Physical Activity: Not on file  Stress: Not on file  Social Connections: Not on file  Intimate Partner Violence: Not on file      Marcial Pacas, M.D. Ph.D.  [facility] [address] [place], [place] [address] Ph: [phone] Fax: [phone]  CC:  [name], [facility] [place]  [place],  [place] [address]  [name], DO

## 2021-05-25 ENCOUNTER — Other Ambulatory Visit: Payer: Self-pay

## 2021-05-25 ENCOUNTER — Ambulatory Visit (HOSPITAL_COMMUNITY)
Admission: RE | Admit: 2021-05-25 | Discharge: 2021-05-25 | Disposition: A | Discharge status: Home / Self Care | Payer: 59 | Source: Ambulatory Visit | Attending: Neurology | Admitting: Neurology

## 2021-05-25 ENCOUNTER — Encounter (HOSPITAL_COMMUNITY)
Admission: RE | Admit: 2021-05-25 | Discharge: 2021-05-25 | Disposition: A | Discharge status: Home / Self Care | Payer: 59 | Source: Ambulatory Visit | Attending: Neurology | Admitting: Neurology

## 2021-05-25 DIAGNOSIS — R269 Unspecified abnormalities of gait and mobility: Secondary | ICD-10-CM | POA: Diagnosis present

## 2021-05-25 DIAGNOSIS — G20 Parkinson's disease: Secondary | ICD-10-CM | POA: Insufficient documentation

## 2021-05-25 MED ORDER — POTASSIUM IODIDE (ANTIDOTE) 130 MG PO TABS
ORAL_TABLET | ORAL | Status: AC
  Filled 2021-05-25: qty 1

## 2021-05-25 MED ORDER — POTASSIUM IODIDE (ANTIDOTE) 130 MG PO TABS: 130.0000 mg | ORAL_TABLET | Freq: Once | ORAL | Status: DC

## 2021-05-28 ENCOUNTER — Telehealth: Payer: Self-pay | Admitting: Neurology

## 2021-05-28 NOTE — Telephone Encounter (Signed)
Referral sent to [facility] for Dr. Linus Mako. They will call patient to schedule. Phone: [phone].

## 2021-05-28 NOTE — Telephone Encounter (Signed)
Please call patient and his family, decreased radiotracer activity within the right stratum, consistent with parkinsonian syndrome,   Decreased radiotracer activity within the RIGHT striatum is a pattern typical of parkinsonian syndrome pathology.   Of note, DaTSCAN is not diagnostic of Parkinsonian syndromes, which remains a clinical diagnosis. DaTscan is an adjuvant test to aid in the clinical diagnosis of Parkinsonian syndromes.

## 2021-05-29 NOTE — Telephone Encounter (Signed)
I spoke to the patient and provided him with the results. He plans to continue Sinemet and will add Requip. He has a supply of Azilect at home. Per vo by Dr. Krista Blue, he should just hold on to that medication for now (not start it). She would like to see how the new addition of ropinirole works first. He verbalized understanding of this plan.  He requested Dr. Krista Blue call him directly. He would like to speak to her in more detail about his DaTscan results.

## 2021-05-29 NOTE — Telephone Encounter (Signed)
I spoke with [place] at [facility] regarding patient's referral. Here is what she told me: Patient has a 50% copay with any home health agency. He would be better off going to Outpatient for rehab services.  I will reach out to patient to relay this information and see if he is able to do outpatient.

## 2021-05-30 NOTE — Telephone Encounter (Signed)
I have called patient, explained to him DatSCAN report.  IMPRESSION: Decreased radiotracer activity within the RIGHT striatum is a pattern typical of parkinsonian syndrome pathology.   Of note, DaTSCAN is not diagnostic of Parkinsonian syndromes, which remains a clinical diagnosis. DaTscan is an adjuvant test to aid in the clinical diagnosis of Parkinsonian syndromes.  He complains of tired, sleepiness taking requip XL 2mg  2 tabs qhs, I have advised him to take just one tab qhs.

## 2021-05-30 NOTE — Telephone Encounter (Signed)
Reached out to Marjory Lies with [facility] regarding patient's home health referral. Waiting for response.

## 2021-05-30 NOTE — Telephone Encounter (Signed)
Spoke with Marjory Lies with [facility]. He states they are able to accept patient. They will call patient to coordinate.

## 2021-06-05 DIAGNOSIS — Z0271 Encounter for disability determination: Secondary | ICD-10-CM

## 2021-06-07 ENCOUNTER — Other Ambulatory Visit: Payer: Self-pay | Admitting: Family Medicine

## 2021-06-08 ENCOUNTER — Telehealth: Payer: Self-pay | Admitting: Neurology

## 2021-06-08 NOTE — Telephone Encounter (Signed)
Christy from [facility] called stating pt missed his speech therapy today because they have discharged the patient from any in home therapy due to the fact that pt was having to pay a $50 copay and that was too expensive for him. Pt will have Medicare starting Nov. 1st and would like a referral back to them once it goes into effect.

## 2021-06-08 NOTE — Telephone Encounter (Signed)
Noted. Reminder set.

## 2021-06-21 ENCOUNTER — Telehealth: Payer: Self-pay | Admitting: Neurology

## 2021-06-21 ENCOUNTER — Other Ambulatory Visit: Payer: Self-pay | Admitting: Emergency Medicine

## 2021-06-21 MED ORDER — ROPINIROLE HCL ER 2 MG PO TB24: 4.0000 mg | ORAL_TABLET | Freq: Every day | ORAL | 9 refills | Status: DC

## 2021-06-21 NOTE — Telephone Encounter (Signed)
Pt request refill for rOPINIRole (REQUIP XL) 2 MG 24 hr tablet  at [facility]. Need a 90 day supply.

## 2021-06-26 ENCOUNTER — Telehealth: Payer: Self-pay

## 2021-06-26 NOTE — Telephone Encounter (Signed)
Received fax from [place] stating prior authorization is not required because it is a covered benefit.   Ref#: [id number]

## 2021-06-26 NOTE — Telephone Encounter (Signed)
I submitted a PA on CMM, Key: [id number]. Awaiting determination from [place]

## 2021-06-27 ENCOUNTER — Telehealth: Payer: Self-pay | Admitting: Neurology

## 2021-06-27 ENCOUNTER — Telehealth: Payer: Self-pay

## 2021-06-27 MED ORDER — ROPINIROLE HCL ER 2 MG PO TB24
4.0000 mg | ORAL_TABLET | Freq: Every day | ORAL | 3 refills | Status: DC
Start: 2021-06-27 — End: 2021-07-10

## 2021-06-27 MED ORDER — CARBIDOPA-LEVODOPA 25-100 MG PO TABS: 2.0000 | ORAL_TABLET | Freq: Three times a day (TID) | ORAL | 3 refills | Status: DC

## 2021-06-27 NOTE — Telephone Encounter (Signed)
Pt's daughter in law called stating that pt is c/o back and chest pain, SOB and Pulse Ox of 63%.  Daughter in law stated that a family member is an ER RN who advised them that the pt may have "pleurisy".  Advised pt's daughter in law that there are several different medical conditions that could be causing his symptoms (ie PE, MI, etc) and that pt needed EMS right away.  Ms. Bither inquired about obtaining a new pulse ox and if his level was better did we still recommend ED.  Advised that care should not be delayed as some of the possible diagnoses can be life threatening.  Again, advised that Ms. Kunzman call 9-1-1 immediately.  Ms. Thibeau expressed understanding.  Charyl Bigger, CMA

## 2021-06-27 NOTE — Telephone Encounter (Signed)
I called the patient to confirm he is going to start using [facility]. 90-day refills being sent.

## 2021-06-27 NOTE — Telephone Encounter (Signed)
Randall Fisher @ [facility] has called stating that a 90 day is needed on pt's carbidopa-levodopa (SINEMET IR) 25-100 MG tablet and rOPINIRole (REQUIP XL) 2 MG 24 hr tablet their fax# is [phone]

## 2021-06-29 ENCOUNTER — Other Ambulatory Visit: Payer: Self-pay

## 2021-06-29 DIAGNOSIS — E782 Mixed hyperlipidemia: Secondary | ICD-10-CM

## 2021-06-29 MED ORDER — FENOFIBRATE 160 MG PO TABS: 160.0000 mg | ORAL_TABLET | Freq: Every day | ORAL | 1 refills | Status: DC

## 2021-06-29 MED ORDER — FUROSEMIDE 20 MG PO TABS: 20.0000 mg | ORAL_TABLET | Freq: Every day | ORAL | 1 refills | Status: DC

## 2021-06-29 MED ORDER — OMEPRAZOLE 20 MG PO CPDR: 20.0000 mg | DELAYED_RELEASE_CAPSULE | Freq: Every day | ORAL | 1 refills | Status: DC

## 2021-06-29 MED ORDER — DONEPEZIL HCL 5 MG PO TABS: 5.0000 mg | ORAL_TABLET | Freq: Every day | ORAL | 1 refills | Status: DC

## 2021-06-29 MED ORDER — TAMSULOSIN HCL 0.4 MG PO CAPS
0.4000 mg | ORAL_CAPSULE | Freq: Every day | ORAL | 3 refills | Status: DC
Start: 2021-06-29 — End: 2021-09-25

## 2021-06-29 MED ORDER — ROSUVASTATIN CALCIUM 10 MG PO TABS: 10.0000 mg | ORAL_TABLET | Freq: Every day | ORAL | 1 refills | Status: DC

## 2021-06-29 NOTE — Addendum Note (Signed)
Addended by: Peggye Ley on: 06/29/2021 01:25 PM   Modules accepted: Orders

## 2021-07-10 MED ORDER — ROPINIROLE HCL ER 2 MG PO TB24: 4.0000 mg | ORAL_TABLET | Freq: Every day | ORAL | 3 refills | Status: DC

## 2021-07-10 NOTE — Telephone Encounter (Signed)
Pt called states the rOPINIRole (REQUIP XL) 2 MG 24 hr tablet needs to be sent to [facility]. Pt requesting a call back.

## 2021-07-10 NOTE — Telephone Encounter (Signed)
I called patient. He will be using [facility] beginning 07/17/2021. Until then, he uses [facility]. He needs ropinirole XL 2mg  sent to [facility]. I have sent this in. Pt verbalized understanding.

## 2021-07-10 NOTE — Addendum Note (Signed)
Addended by: Lester Friendship A on: 07/10/2021 03:18 PM   Modules accepted: Orders

## 2021-07-11 ENCOUNTER — Telehealth: Payer: Self-pay | Admitting: General Practice

## 2021-07-11 NOTE — Telephone Encounter (Signed)
Transition Care Management Follow-up Telephone Call Date of discharge and from where: 07/09/21 from [facility] How have you been since you were released from the hospital? Doing better overall.  Any questions or concerns? No  Items Reviewed: Did the pt receive and understand the discharge instructions provided? Yes  Medications obtained and verified? Yes  Other? No  Any new allergies since your discharge? No  Dietary orders reviewed? Yes Do you have support at home? Yes   Home Care and Equipment/Supplies: Were home health services ordered? yes If so, what is the name of the agency? [facility]  Has the agency set up a time to come to the patient's home? yes Were any new equipment or medical supplies ordered?  No  Functional Questionnaire: (I = Independent and D = Dependent) ADLs: I  Bathing/Dressing- I  Meal Prep- I  Eating- I  Maintaining continence- I  Transferring/Ambulation- I  Managing Meds- I  Follow up appointments reviewed:  PCP Hospital f/u appt confirmed? Yes  Scheduled to see Dr. Zigmund Daniel on 07/26/21 @ 1030. [facility] f/u appt confirmed? No   Are transportation arrangements needed? No  If their condition worsens, is the pt aware to call PCP or go to the Emergency Dept.? Yes Was the patient provided with contact information for the PCP's office or ED? Yes Was to pt encouraged to call back with questions or concerns? Yes

## 2021-07-12 ENCOUNTER — Telehealth: Payer: Self-pay

## 2021-07-12 NOTE — Telephone Encounter (Signed)
Duncan Dull RN @ [facility] stating they will not begin Start of Care with Mr. Blanford until 07/17/21 due to insurance.

## 2021-07-12 NOTE — Telephone Encounter (Signed)
Pt called states the rOPINIRole (REQUIP XL) 2 MG 24 hr tablet needs a PA. Pt provided a phone to call for [facility].

## 2021-07-17 NOTE — Telephone Encounter (Signed)
Pt returned call , please call back   °

## 2021-07-17 NOTE — Telephone Encounter (Signed)
I called patient. I advised him that [facility] informed us that a PA was not needed for ropinirole. We have not received a PA request either. He will check with his pharmacy and let us know what is needed or have the pharmacy contact us.

## 2021-07-17 NOTE — Telephone Encounter (Signed)
Elmyra Ricks, CMA attempted a PA for ropinirole XL last month and [facility] said that a PA is not needed and that it is a covered benefit.  I called patient to discuss. No answer, left a message asking him to call me back.  Please confirm this with the patient if he calls back. His insurance has changed today I believe.

## 2021-07-20 ENCOUNTER — Telehealth: Payer: Self-pay

## 2021-07-20 NOTE — Telephone Encounter (Signed)
Received call from [facility].   Pt states they completed initial evaluation. Determined patient is at his baseline and there is no need for therapy.

## 2021-07-24 DIAGNOSIS — R251 Tremor, unspecified: Secondary | ICD-10-CM | POA: Diagnosis not present

## 2021-07-24 DIAGNOSIS — R479 Unspecified speech disturbances: Secondary | ICD-10-CM | POA: Diagnosis not present

## 2021-07-24 DIAGNOSIS — M2560 Stiffness of unspecified joint, not elsewhere classified: Secondary | ICD-10-CM | POA: Diagnosis not present

## 2021-07-24 DIAGNOSIS — G20 Parkinson's disease: Secondary | ICD-10-CM | POA: Diagnosis not present

## 2021-07-24 DIAGNOSIS — R296 Repeated falls: Secondary | ICD-10-CM | POA: Diagnosis not present

## 2021-07-24 DIAGNOSIS — R2689 Other abnormalities of gait and mobility: Secondary | ICD-10-CM | POA: Diagnosis not present

## 2021-07-26 ENCOUNTER — Other Ambulatory Visit: Payer: Self-pay

## 2021-07-26 ENCOUNTER — Ambulatory Visit (INDEPENDENT_AMBULATORY_CARE_PROVIDER_SITE_OTHER): Payer: Medicare HMO | Admitting: Family Medicine

## 2021-07-26 ENCOUNTER — Ambulatory Visit (INDEPENDENT_AMBULATORY_CARE_PROVIDER_SITE_OTHER): Payer: Medicare HMO

## 2021-07-26 ENCOUNTER — Encounter: Payer: Self-pay | Admitting: Family Medicine

## 2021-07-26 VITALS — BP 117/68 | HR 86 | Ht 67.0 in | Wt 238.0 lb

## 2021-07-26 DIAGNOSIS — G20 Parkinson's disease: Secondary | ICD-10-CM

## 2021-07-26 DIAGNOSIS — N184 Chronic kidney disease, stage 4 (severe): Secondary | ICD-10-CM | POA: Diagnosis not present

## 2021-07-26 DIAGNOSIS — E1122 Type 2 diabetes mellitus with diabetic chronic kidney disease: Secondary | ICD-10-CM

## 2021-07-26 DIAGNOSIS — I10 Essential (primary) hypertension: Secondary | ICD-10-CM

## 2021-07-26 DIAGNOSIS — J189 Pneumonia, unspecified organism: Secondary | ICD-10-CM

## 2021-07-26 DIAGNOSIS — Z794 Long term (current) use of insulin: Secondary | ICD-10-CM | POA: Diagnosis not present

## 2021-07-26 DIAGNOSIS — I872 Venous insufficiency (chronic) (peripheral): Secondary | ICD-10-CM | POA: Diagnosis not present

## 2021-07-26 DIAGNOSIS — Z23 Encounter for immunization: Secondary | ICD-10-CM | POA: Diagnosis not present

## 2021-07-26 NOTE — Assessment & Plan Note (Signed)
Stable with Sinemet and ropinirole.  He will continue management per neurology.

## 2021-07-26 NOTE — Assessment & Plan Note (Signed)
Blood pressure well controll ed at this time.  Continue current antihypertensive medications

## 2021-07-26 NOTE — Progress Notes (Signed)
Randall Fisher - 65 y.o. male MRN [id number]  Date of birth: [birth year]  Subjective Chief Complaint  Patient presents with   Follow-up    HPI Randall Fisher is a 65 year old male here today for hospital follow-up.  He was admitted to the hospital on 06/27/2021 due shortness of breath and chest pain.  He was noted to be hypoxic in the ED with desaturation into the 80s.  He did have mildly elevated white count.  CT PE of the chest showed masslike opacities in the hilum concerning for infection or malignancy he was started on Rocephin and azithromycin with admission for further work-up.  He did have bronchoscopy and biopsy did not show any findings of recurrent malignancy.  He was able to wean from oxygen prior to discharge.  He was discharged with an additional 4 days of cefdinir.  Reports her blood sugars have been doing okay at home.  Readings are typically around 120.  He has not had any hypoglycemic symptoms.   A1c in the hospital was 8.5%.    Parkinson's is stable with Sinemet and ropinirole.  ROS:  A comprehensive ROS was completed and negative except as noted per HPI   Allergies  Allergen Reactions   Mushroom Extract Complex Swelling   Shellfish Allergy Swelling    THROAT SWELLED/ HANDS SWELLED.    Past Medical History:  Diagnosis Date   Back pain    Diabetes mellitus without complication (HCC)    Hyperlipidemia    Joint pain    Kidney disease    Leg swelling    Lung cancer ([place])    Memory loss    Neuropathy    Numbness and tingling in both hands    S/P lobectomy of lung 2013   RUL for NSCLC    Past Surgical History:  Procedure Laterality Date   CARPAL TUNNEL RELEASE Bilateral    LUNG LOBECTOMY Right 2013   TONSILLECTOMY      Social History   Socioeconomic History   Marital status: Married    Spouse name: [name]   Number of children: 1   Years of education: some college   Highest education level: Not on file  Occupational History   Occupation: unemployed - laid off    Occupation: seeking disability  Tobacco Use   Smoking status: Former   Smokeless tobacco: Never  Substance and Sexual Activity   Alcohol use: Not Currently   Drug use: Never   Sexual activity: Yes    Partners: Female    Birth control/protection: Condom  Other Topics Concern   Not on file  Social History Narrative   Lives at home with his wife.   1-2 cups caffeine per day.   Right-handed.      Social Determinants of Health   Financial Resource Strain: Not on file  Food Insecurity: Not on file  Transportation Needs: Not on file  Physical Activity: Not on file  Stress: Not on file  Social Connections: Not on file    Family History  Problem Relation Age of Onset   Diabetes Mother    Kidney failure Father     Health Maintenance  Topic Date Due   URINE MICROALBUMIN  Never done   Hepatitis C Screening  Never done   Zoster Vaccines- Shingrix (1 of 2) Never done   HEMOGLOBIN A1C  05/11/2020   OPHTHALMOLOGY EXAM  09/16/2020   COVID-19 Vaccine (4 - Booster for Moderna series) 10/01/2020   FOOT EXAM  07/26/2022   TETANUS/TDAP  07/16/2026  COLONOSCOPY (Pts 45-60yrs Insurance coverage will need to be confirmed)  08/17/2029   Pneumococcal Vaccine 55-22 Years old  Completed   INFLUENZA VACCINE  Completed   HPV VACCINES  Aged Out   HIV Screening  Discontinued     ----------------------------------------------------------------------------------------------------------------------------------------------------------------------------------------------------------------- Physical Exam BP 117/68 (BP Location: Right Arm, Patient Position: Sitting, Cuff Size: Large)   Pulse 86   Ht 5\' 7"  (1.702 m)   Wt 238 lb (108 kg)   SpO2 97%   BMI 37.28 kg/m   Physical Exam Constitutional:      Appearance: Normal appearance.  HENT:     Head: Normocephalic and atraumatic.  Eyes:     General: No scleral icterus. Cardiovascular:     Rate and Rhythm: Normal rate and regular rhythm.   Pulmonary:     Effort: Pulmonary effort is normal.     Comments: Coarse breath sounds bilaterally.  I Musculoskeletal:     Cervical back: Neck supple.  Neurological:     General: No focal deficit present.     Mental Status: He is alert.  Psychiatric:        Mood and Affect: Mood normal.        Behavior: Behavior normal.    ------------------------------------------------------------------------------------------------------------------------------------------------------------------------------------------------------------------- Assessment and Plan  Hypertension Blood pressure well controll ed at this time.  Continue current antihypertensive medications  Venous insufficiency Mild swelling bilaterally.  Recommend keeping legs elevated when at rest as well as trying compression stockings.  DM (diabetes mellitus) ([place]) Management per endocrinology.  Recent A1c elevated 8.5%.  Parkinsonism ([place]) Stable with Sinemet and ropinirole.  He will continue management per neurology.  Postobstructive pneumonia He has completed course of antibiotics.  Feels well today.  O2 sats are normal on room air.  Does have some coarse breath sounds.  Repeat chest x-ray today.   No orders of the defined types were placed in this encounter.   No follow-ups on file.    This visit occurred during the SARS-CoV-2 public health emergency.  Safety protocols were in place, including screening questions prior to the visit, additional usage of staff PPE, and extensive cleaning of exam room while observing appropriate contact time as indicated for disinfecting solutions.

## 2021-07-26 NOTE — Assessment & Plan Note (Signed)
Mild swelling bilaterally.  Recommend keeping legs elevated when at rest as well as trying compression stockings.

## 2021-07-26 NOTE — Assessment & Plan Note (Signed)
Management per endocrinology.  Recent A1c elevated 8.5%.

## 2021-07-26 NOTE — Assessment & Plan Note (Signed)
He has completed course of antibiotics.  Feels well today.  O2 sats are normal on room air.  Does have some coarse breath sounds.  Repeat chest x-ray today.

## 2021-07-26 NOTE — Patient Instructions (Signed)
Great to see you today! Continue current medications.

## 2021-07-27 LAB — CBC WITH DIFFERENTIAL/PLATELET
Absolute Monocytes: 557 cells/uL (ref 200–950)
Basophils Absolute: 38 cells/uL (ref 0–200)
Basophils Relative: 0.8 %
Eosinophils Absolute: 427 cells/uL (ref 15–500)
Eosinophils Relative: 8.9 %
HCT: 32.6 % — ABNORMAL LOW (ref 38.5–50.0)
Hemoglobin: 10.1 g/dL — ABNORMAL LOW (ref 13.2–17.1)
Lymphs Abs: 1656 cells/uL (ref 850–3900)
MCH: 24 pg — ABNORMAL LOW (ref 27.0–33.0)
MCHC: 31 g/dL — ABNORMAL LOW (ref 32.0–36.0)
MCV: 77.4 fL — ABNORMAL LOW (ref 80.0–100.0)
MPV: 11.2 fL (ref 7.5–12.5)
Monocytes Relative: 11.6 %
Neutro Abs: 2122 cells/uL (ref 1500–7800)
Neutrophils Relative %: 44.2 %
Platelets: 175 10*3/uL (ref 140–400)
RBC: 4.21 10*6/uL (ref 4.20–5.80)
RDW: 15.7 % — ABNORMAL HIGH (ref 11.0–15.0)
Total Lymphocyte: 34.5 %
WBC: 4.8 10*3/uL (ref 3.8–10.8)

## 2021-07-27 LAB — BASIC METABOLIC PANEL
BUN/Creatinine Ratio: 10 (calc) (ref 6–22)
BUN: 15 mg/dL (ref 7–25)
CO2: 30 mmol/L (ref 20–32)
Calcium: 9.4 mg/dL (ref 8.6–10.3)
Chloride: 103 mmol/L (ref 98–110)
Creat: 1.44 mg/dL — ABNORMAL HIGH (ref 0.70–1.35)
Glucose, Bld: 96 mg/dL (ref 65–99)
Potassium: 4.2 mmol/L (ref 3.5–5.3)
Sodium: 143 mmol/L (ref 135–146)

## 2021-07-31 DIAGNOSIS — C3491 Malignant neoplasm of unspecified part of right bronchus or lung: Secondary | ICD-10-CM | POA: Diagnosis not present

## 2021-07-31 DIAGNOSIS — R918 Other nonspecific abnormal finding of lung field: Secondary | ICD-10-CM | POA: Diagnosis not present

## 2021-07-31 DIAGNOSIS — J8410 Pulmonary fibrosis, unspecified: Secondary | ICD-10-CM | POA: Diagnosis not present

## 2021-07-31 DIAGNOSIS — R9389 Abnormal findings on diagnostic imaging of other specified body structures: Secondary | ICD-10-CM | POA: Diagnosis not present

## 2021-08-08 ENCOUNTER — Telehealth: Payer: Medicare HMO

## 2021-08-15 ENCOUNTER — Telehealth: Payer: Self-pay

## 2021-08-15 NOTE — Telephone Encounter (Signed)
VO given to [facility] [phone]) ST evaluation per pt request 2 visits weekly x 4 weeks

## 2021-08-27 DIAGNOSIS — F411 Generalized anxiety disorder: Secondary | ICD-10-CM | POA: Diagnosis not present

## 2021-08-27 DIAGNOSIS — F322 Major depressive disorder, single episode, severe without psychotic features: Secondary | ICD-10-CM | POA: Diagnosis not present

## 2021-08-27 DIAGNOSIS — G20 Parkinson's disease: Secondary | ICD-10-CM | POA: Diagnosis not present

## 2021-08-31 ENCOUNTER — Encounter: Payer: Self-pay | Admitting: Family Medicine

## 2021-08-31 ENCOUNTER — Telehealth (INDEPENDENT_AMBULATORY_CARE_PROVIDER_SITE_OTHER): Payer: Medicare Other | Admitting: Family Medicine

## 2021-08-31 VITALS — Ht 70.0 in | Wt 242.0 lb

## 2021-08-31 DIAGNOSIS — J069 Acute upper respiratory infection, unspecified: Secondary | ICD-10-CM

## 2021-08-31 DIAGNOSIS — J40 Bronchitis, not specified as acute or chronic: Secondary | ICD-10-CM

## 2021-08-31 DIAGNOSIS — J329 Chronic sinusitis, unspecified: Secondary | ICD-10-CM

## 2021-08-31 MED ORDER — PREDNISONE 20 MG PO TABS: 40.0000 mg | ORAL_TABLET | Freq: Every day | ORAL | 0 refills | Status: AC

## 2021-08-31 MED ORDER — FLUTICASONE PROPIONATE 50 MCG/ACT NA SUSP: 2.0000 | Freq: Every day | NASAL | 0 refills | Status: AC

## 2021-08-31 MED ORDER — ALBUTEROL SULFATE (2.5 MG/3ML) 0.083% IN NEBU: 2.5000 mg | INHALATION_SOLUTION | Freq: Four times a day (QID) | RESPIRATORY_TRACT | 1 refills | Status: DC | PRN

## 2021-08-31 MED ORDER — AZITHROMYCIN 250 MG PO TABS: ORAL_TABLET | ORAL | 0 refills | Status: DC

## 2021-08-31 MED ORDER — BUDESONIDE 0.5 MG/2ML IN SUSP: RESPIRATORY_TRACT | 10 refills | Status: DC

## 2021-08-31 NOTE — Progress Notes (Signed)
Virtual Video Visit via MyChart Note  I connected with  Randall Fisher on 08/31/21 at 10:10 AM EST by the video enabled telemedicine application for MyChart, and verified that I am speaking with the correct person using two identifiers.   I introduced myself as a Designer, jewellery with the practice. We discussed the limitations of evaluation and management by telemedicine and the availability of in person appointments. The patient expressed understanding and agreed to proceed.  Participating parties in this visit include: The patient and the nurse practitioner listed.  The patient is: At home I am: In the office - Primary Care Randall Fisher  Subjective:    CC:  Chief Complaint  Patient presents with   FLU LIKE SYMPTOMS    HPI: Randall Fisher is a 65 y.o. year old male presenting today via [place] today for flu-like symptoms.  Patient states that symptoms started 2-3 days ago: fever/chills, productive cough with green-yellow sputum, headache, sore throat, nasal congestion, rhinorrhea, diarrhea, fatigue, weakness, occasional shortness of breath and wheezing. States that he feels "sick as a dog." He has been able to eat and stay hydrated. He denies any chest pain, urinary changes, rashes, confusion. O2 93% at home.     Past medical history, Surgical history, Family history not pertinant except as noted below, Social history, Allergies, and medications have been entered into the medical record, reviewed, and corrections made.   Review of Systems:  All review of systems negative except what is listed in the HPI   Objective:    General:  Speaking clearly in complete sentences. Absent shortness of breath noted.   Alert and oriented x3.   Normal judgment.  Absent acute distress.   Impression and Recommendations:    1. Viral URI Unable to come to office today for flu testing. He is concerned because he states he is prone to pneumonia. Discussed viral vs bacterial infections and  significance of antibiotic stewardship. Agreed to refill his nebulizers and start with a prednisone burst in addition to URI supportive measures and OTC cough/cold/analgesics. Given that we are going into the weekend, will send in a Zpak, but encouraged him try a few more days of conservative therapy first. Patient agreeable to plan. Patient aware of signs/symptoms requiring further/urgent evaluation.    Follow-up if symptoms worsen or fail to improve.    I discussed the assessment and treatment plan with the patient. The patient was provided an opportunity to ask questions and all were answered. The patient agreed with the plan and demonstrated an understanding of the instructions.   The patient was advised to call back or seek an in-person evaluation if the symptoms worsen or if the condition fails to improve as anticipated.  I spent 20 minutes dedicated to the care of this patient on the date of this encounter to include pre-visit chart review of prior notes and results, face-to-face time with the patient, and post-visit ordering of testing as indicated.   Terrilyn Saver, NP

## 2021-09-03 NOTE — Telephone Encounter (Signed)
Insurance has been scanned into patient's chart. AM

## 2021-09-14 ENCOUNTER — Encounter: Payer: Self-pay | Admitting: Family Medicine

## 2021-09-25 ENCOUNTER — Other Ambulatory Visit: Payer: Self-pay

## 2021-09-25 DIAGNOSIS — E782 Mixed hyperlipidemia: Secondary | ICD-10-CM

## 2021-09-25 MED ORDER — NYSTATIN-TRIAMCINOLONE 100000-0.1 UNIT/GM-% EX CREA: TOPICAL_CREAM | Freq: Two times a day (BID) | CUTANEOUS | 1 refills | Status: DC

## 2021-09-25 MED ORDER — ROSUVASTATIN CALCIUM 10 MG PO TABS: 10.0000 mg | ORAL_TABLET | Freq: Every day | ORAL | 1 refills | Status: DC

## 2021-09-25 MED ORDER — FENOFIBRATE 160 MG PO TABS
160.0000 mg | ORAL_TABLET | Freq: Every day | ORAL | 1 refills | Status: DC
Start: 2021-09-25 — End: 2022-06-05

## 2021-09-25 MED ORDER — BD PEN NEEDLE NANO U/F 32G X 4 MM MISC: 2 refills | Status: DC

## 2021-09-25 MED ORDER — TADALAFIL 20 MG PO TABS: 10.0000 mg | ORAL_TABLET | ORAL | 3 refills | Status: DC | PRN

## 2021-09-25 MED ORDER — FUROSEMIDE 20 MG PO TABS: 20.0000 mg | ORAL_TABLET | Freq: Every day | ORAL | 1 refills | Status: DC

## 2021-09-25 MED ORDER — TAMSULOSIN HCL 0.4 MG PO CAPS: 0.4000 mg | ORAL_CAPSULE | Freq: Every day | ORAL | 3 refills | Status: DC

## 2021-09-25 MED ORDER — OMEPRAZOLE 20 MG PO CPDR: 20.0000 mg | DELAYED_RELEASE_CAPSULE | Freq: Every day | ORAL | 1 refills | Status: DC

## 2021-09-25 MED ORDER — DONEPEZIL HCL 5 MG PO TABS: 5.0000 mg | ORAL_TABLET | Freq: Every day | ORAL | 1 refills | Status: DC

## 2021-09-25 MED ORDER — FLUTICASONE-SALMETEROL 250-50 MCG/ACT IN AEPB: 1.0000 | INHALATION_SPRAY | Freq: Two times a day (BID) | RESPIRATORY_TRACT | 2 refills | Status: DC

## 2021-09-27 LAB — HEMOGLOBIN A1C: Hemoglobin A1C: 9.5

## 2021-10-04 ENCOUNTER — Ambulatory Visit: Payer: 59 | Admitting: Neurology

## 2021-10-05 ENCOUNTER — Telehealth: Payer: Self-pay

## 2021-10-05 NOTE — Telephone Encounter (Signed)
[name], ST w/ [facility] l[facility] stating she missed a session with Randall Fisher. She will be seeing him next week.   [phone]

## 2021-10-17 DIAGNOSIS — F0283 Dementia in other diseases classified elsewhere, unspecified severity, with mood disturbance: Secondary | ICD-10-CM | POA: Diagnosis not present

## 2021-10-17 DIAGNOSIS — K30 Functional dyspepsia: Secondary | ICD-10-CM | POA: Diagnosis not present

## 2021-10-17 DIAGNOSIS — G4733 Obstructive sleep apnea (adult) (pediatric): Secondary | ICD-10-CM | POA: Diagnosis not present

## 2021-10-17 DIAGNOSIS — E1122 Type 2 diabetes mellitus with diabetic chronic kidney disease: Secondary | ICD-10-CM | POA: Diagnosis not present

## 2021-10-17 DIAGNOSIS — M5126 Other intervertebral disc displacement, lumbar region: Secondary | ICD-10-CM | POA: Diagnosis not present

## 2021-10-17 DIAGNOSIS — G20 Parkinson's disease: Secondary | ICD-10-CM | POA: Diagnosis not present

## 2021-10-17 DIAGNOSIS — F0284 Dementia in other diseases classified elsewhere, unspecified severity, with anxiety: Secondary | ICD-10-CM | POA: Diagnosis not present

## 2021-10-17 DIAGNOSIS — J309 Allergic rhinitis, unspecified: Secondary | ICD-10-CM | POA: Diagnosis not present

## 2021-10-17 DIAGNOSIS — K219 Gastro-esophageal reflux disease without esophagitis: Secondary | ICD-10-CM | POA: Diagnosis not present

## 2021-10-17 DIAGNOSIS — D509 Iron deficiency anemia, unspecified: Secondary | ICD-10-CM | POA: Diagnosis not present

## 2021-10-17 DIAGNOSIS — I10 Essential (primary) hypertension: Secondary | ICD-10-CM | POA: Diagnosis not present

## 2021-10-17 DIAGNOSIS — N183 Chronic kidney disease, stage 3 unspecified: Secondary | ICD-10-CM | POA: Diagnosis not present

## 2021-10-17 DIAGNOSIS — C3401 Malignant neoplasm of right main bronchus: Secondary | ICD-10-CM | POA: Diagnosis not present

## 2021-10-17 DIAGNOSIS — E1142 Type 2 diabetes mellitus with diabetic polyneuropathy: Secondary | ICD-10-CM | POA: Diagnosis not present

## 2021-10-17 DIAGNOSIS — J9811 Atelectasis: Secondary | ICD-10-CM | POA: Diagnosis not present

## 2021-10-17 DIAGNOSIS — E782 Mixed hyperlipidemia: Secondary | ICD-10-CM | POA: Diagnosis not present

## 2021-10-17 DIAGNOSIS — F17290 Nicotine dependence, other tobacco product, uncomplicated: Secondary | ICD-10-CM | POA: Diagnosis not present

## 2021-10-17 DIAGNOSIS — M5137 Other intervertebral disc degeneration, lumbosacral region: Secondary | ICD-10-CM | POA: Diagnosis not present

## 2021-10-17 DIAGNOSIS — M1990 Unspecified osteoarthritis, unspecified site: Secondary | ICD-10-CM | POA: Diagnosis not present

## 2021-10-17 DIAGNOSIS — K5900 Constipation, unspecified: Secondary | ICD-10-CM | POA: Diagnosis not present

## 2021-10-17 DIAGNOSIS — J9601 Acute respiratory failure with hypoxia: Secondary | ICD-10-CM | POA: Diagnosis not present

## 2021-10-19 ENCOUNTER — Telehealth: Payer: Self-pay

## 2021-10-19 DIAGNOSIS — E1142 Type 2 diabetes mellitus with diabetic polyneuropathy: Secondary | ICD-10-CM | POA: Diagnosis not present

## 2021-10-19 DIAGNOSIS — J9601 Acute respiratory failure with hypoxia: Secondary | ICD-10-CM | POA: Diagnosis not present

## 2021-10-19 DIAGNOSIS — J9811 Atelectasis: Secondary | ICD-10-CM | POA: Diagnosis not present

## 2021-10-19 DIAGNOSIS — C3401 Malignant neoplasm of right main bronchus: Secondary | ICD-10-CM | POA: Diagnosis not present

## 2021-10-19 DIAGNOSIS — K30 Functional dyspepsia: Secondary | ICD-10-CM | POA: Diagnosis not present

## 2021-10-19 DIAGNOSIS — E1122 Type 2 diabetes mellitus with diabetic chronic kidney disease: Secondary | ICD-10-CM | POA: Diagnosis not present

## 2021-10-19 DIAGNOSIS — N183 Chronic kidney disease, stage 3 unspecified: Secondary | ICD-10-CM | POA: Diagnosis not present

## 2021-10-19 DIAGNOSIS — F17290 Nicotine dependence, other tobacco product, uncomplicated: Secondary | ICD-10-CM | POA: Diagnosis not present

## 2021-10-19 DIAGNOSIS — K5900 Constipation, unspecified: Secondary | ICD-10-CM | POA: Diagnosis not present

## 2021-10-19 DIAGNOSIS — M1990 Unspecified osteoarthritis, unspecified site: Secondary | ICD-10-CM | POA: Diagnosis not present

## 2021-10-19 DIAGNOSIS — M5126 Other intervertebral disc displacement, lumbar region: Secondary | ICD-10-CM | POA: Diagnosis not present

## 2021-10-19 DIAGNOSIS — G4733 Obstructive sleep apnea (adult) (pediatric): Secondary | ICD-10-CM | POA: Diagnosis not present

## 2021-10-19 DIAGNOSIS — D509 Iron deficiency anemia, unspecified: Secondary | ICD-10-CM | POA: Diagnosis not present

## 2021-10-19 DIAGNOSIS — K219 Gastro-esophageal reflux disease without esophagitis: Secondary | ICD-10-CM | POA: Diagnosis not present

## 2021-10-19 DIAGNOSIS — E782 Mixed hyperlipidemia: Secondary | ICD-10-CM | POA: Diagnosis not present

## 2021-10-19 DIAGNOSIS — F0283 Dementia in other diseases classified elsewhere, unspecified severity, with mood disturbance: Secondary | ICD-10-CM | POA: Diagnosis not present

## 2021-10-19 DIAGNOSIS — I10 Essential (primary) hypertension: Secondary | ICD-10-CM | POA: Diagnosis not present

## 2021-10-19 DIAGNOSIS — G20 Parkinson's disease: Secondary | ICD-10-CM | POA: Diagnosis not present

## 2021-10-19 DIAGNOSIS — J309 Allergic rhinitis, unspecified: Secondary | ICD-10-CM | POA: Diagnosis not present

## 2021-10-19 DIAGNOSIS — M5137 Other intervertebral disc degeneration, lumbosacral region: Secondary | ICD-10-CM | POA: Diagnosis not present

## 2021-10-19 DIAGNOSIS — F0284 Dementia in other diseases classified elsewhere, unspecified severity, with anxiety: Secondary | ICD-10-CM | POA: Diagnosis not present

## 2021-10-19 NOTE — Telephone Encounter (Signed)
Rcvd call from [facility] @ [place].   Making PCP aware of possible drug interaction: Advair diskus w/ timolol Furosemide w/ timolol  No reactions noted

## 2021-10-22 DIAGNOSIS — F0284 Dementia in other diseases classified elsewhere, unspecified severity, with anxiety: Secondary | ICD-10-CM | POA: Diagnosis not present

## 2021-10-22 DIAGNOSIS — I10 Essential (primary) hypertension: Secondary | ICD-10-CM | POA: Diagnosis not present

## 2021-10-22 DIAGNOSIS — D509 Iron deficiency anemia, unspecified: Secondary | ICD-10-CM | POA: Diagnosis not present

## 2021-10-22 DIAGNOSIS — G4733 Obstructive sleep apnea (adult) (pediatric): Secondary | ICD-10-CM | POA: Diagnosis not present

## 2021-10-22 DIAGNOSIS — J309 Allergic rhinitis, unspecified: Secondary | ICD-10-CM | POA: Diagnosis not present

## 2021-10-22 DIAGNOSIS — E782 Mixed hyperlipidemia: Secondary | ICD-10-CM | POA: Diagnosis not present

## 2021-10-22 DIAGNOSIS — F17290 Nicotine dependence, other tobacco product, uncomplicated: Secondary | ICD-10-CM | POA: Diagnosis not present

## 2021-10-22 DIAGNOSIS — N183 Chronic kidney disease, stage 3 unspecified: Secondary | ICD-10-CM | POA: Diagnosis not present

## 2021-10-22 DIAGNOSIS — F0283 Dementia in other diseases classified elsewhere, unspecified severity, with mood disturbance: Secondary | ICD-10-CM | POA: Diagnosis not present

## 2021-10-22 DIAGNOSIS — K5900 Constipation, unspecified: Secondary | ICD-10-CM | POA: Diagnosis not present

## 2021-10-22 DIAGNOSIS — G20 Parkinson's disease: Secondary | ICD-10-CM | POA: Diagnosis not present

## 2021-10-22 DIAGNOSIS — J9811 Atelectasis: Secondary | ICD-10-CM | POA: Diagnosis not present

## 2021-10-22 DIAGNOSIS — E1122 Type 2 diabetes mellitus with diabetic chronic kidney disease: Secondary | ICD-10-CM | POA: Diagnosis not present

## 2021-10-22 DIAGNOSIS — K30 Functional dyspepsia: Secondary | ICD-10-CM | POA: Diagnosis not present

## 2021-10-22 DIAGNOSIS — J9601 Acute respiratory failure with hypoxia: Secondary | ICD-10-CM | POA: Diagnosis not present

## 2021-10-22 DIAGNOSIS — M5126 Other intervertebral disc displacement, lumbar region: Secondary | ICD-10-CM | POA: Diagnosis not present

## 2021-10-22 DIAGNOSIS — K219 Gastro-esophageal reflux disease without esophagitis: Secondary | ICD-10-CM | POA: Diagnosis not present

## 2021-10-22 DIAGNOSIS — C3401 Malignant neoplasm of right main bronchus: Secondary | ICD-10-CM | POA: Diagnosis not present

## 2021-10-22 DIAGNOSIS — M5137 Other intervertebral disc degeneration, lumbosacral region: Secondary | ICD-10-CM | POA: Diagnosis not present

## 2021-10-22 DIAGNOSIS — E1142 Type 2 diabetes mellitus with diabetic polyneuropathy: Secondary | ICD-10-CM | POA: Diagnosis not present

## 2021-10-22 DIAGNOSIS — M1990 Unspecified osteoarthritis, unspecified site: Secondary | ICD-10-CM | POA: Diagnosis not present

## 2021-10-24 DIAGNOSIS — K219 Gastro-esophageal reflux disease without esophagitis: Secondary | ICD-10-CM | POA: Diagnosis not present

## 2021-10-24 DIAGNOSIS — J309 Allergic rhinitis, unspecified: Secondary | ICD-10-CM | POA: Diagnosis not present

## 2021-10-24 DIAGNOSIS — E782 Mixed hyperlipidemia: Secondary | ICD-10-CM | POA: Diagnosis not present

## 2021-10-24 DIAGNOSIS — G4733 Obstructive sleep apnea (adult) (pediatric): Secondary | ICD-10-CM | POA: Diagnosis not present

## 2021-10-24 DIAGNOSIS — J9601 Acute respiratory failure with hypoxia: Secondary | ICD-10-CM | POA: Diagnosis not present

## 2021-10-24 DIAGNOSIS — E1122 Type 2 diabetes mellitus with diabetic chronic kidney disease: Secondary | ICD-10-CM | POA: Diagnosis not present

## 2021-10-24 DIAGNOSIS — I10 Essential (primary) hypertension: Secondary | ICD-10-CM | POA: Diagnosis not present

## 2021-10-24 DIAGNOSIS — K30 Functional dyspepsia: Secondary | ICD-10-CM | POA: Diagnosis not present

## 2021-10-24 DIAGNOSIS — M5137 Other intervertebral disc degeneration, lumbosacral region: Secondary | ICD-10-CM | POA: Diagnosis not present

## 2021-10-24 DIAGNOSIS — E1142 Type 2 diabetes mellitus with diabetic polyneuropathy: Secondary | ICD-10-CM | POA: Diagnosis not present

## 2021-10-24 DIAGNOSIS — G20 Parkinson's disease: Secondary | ICD-10-CM | POA: Diagnosis not present

## 2021-10-24 DIAGNOSIS — C3401 Malignant neoplasm of right main bronchus: Secondary | ICD-10-CM | POA: Diagnosis not present

## 2021-10-24 DIAGNOSIS — F17290 Nicotine dependence, other tobacco product, uncomplicated: Secondary | ICD-10-CM | POA: Diagnosis not present

## 2021-10-24 DIAGNOSIS — F0284 Dementia in other diseases classified elsewhere, unspecified severity, with anxiety: Secondary | ICD-10-CM | POA: Diagnosis not present

## 2021-10-24 DIAGNOSIS — F0283 Dementia in other diseases classified elsewhere, unspecified severity, with mood disturbance: Secondary | ICD-10-CM | POA: Diagnosis not present

## 2021-10-24 DIAGNOSIS — N183 Chronic kidney disease, stage 3 unspecified: Secondary | ICD-10-CM | POA: Diagnosis not present

## 2021-10-24 DIAGNOSIS — M5126 Other intervertebral disc displacement, lumbar region: Secondary | ICD-10-CM | POA: Diagnosis not present

## 2021-10-24 DIAGNOSIS — J9811 Atelectasis: Secondary | ICD-10-CM | POA: Diagnosis not present

## 2021-10-24 DIAGNOSIS — D509 Iron deficiency anemia, unspecified: Secondary | ICD-10-CM | POA: Diagnosis not present

## 2021-10-24 DIAGNOSIS — M1990 Unspecified osteoarthritis, unspecified site: Secondary | ICD-10-CM | POA: Diagnosis not present

## 2021-10-24 DIAGNOSIS — K5900 Constipation, unspecified: Secondary | ICD-10-CM | POA: Diagnosis not present

## 2021-10-29 DIAGNOSIS — K30 Functional dyspepsia: Secondary | ICD-10-CM | POA: Diagnosis not present

## 2021-10-29 DIAGNOSIS — N183 Chronic kidney disease, stage 3 unspecified: Secondary | ICD-10-CM | POA: Diagnosis not present

## 2021-10-29 DIAGNOSIS — F0283 Dementia in other diseases classified elsewhere, unspecified severity, with mood disturbance: Secondary | ICD-10-CM | POA: Diagnosis not present

## 2021-10-29 DIAGNOSIS — G4733 Obstructive sleep apnea (adult) (pediatric): Secondary | ICD-10-CM | POA: Diagnosis not present

## 2021-10-29 DIAGNOSIS — F0284 Dementia in other diseases classified elsewhere, unspecified severity, with anxiety: Secondary | ICD-10-CM | POA: Diagnosis not present

## 2021-10-29 DIAGNOSIS — M5137 Other intervertebral disc degeneration, lumbosacral region: Secondary | ICD-10-CM | POA: Diagnosis not present

## 2021-10-29 DIAGNOSIS — I10 Essential (primary) hypertension: Secondary | ICD-10-CM | POA: Diagnosis not present

## 2021-10-29 DIAGNOSIS — M5126 Other intervertebral disc displacement, lumbar region: Secondary | ICD-10-CM | POA: Diagnosis not present

## 2021-10-29 DIAGNOSIS — E782 Mixed hyperlipidemia: Secondary | ICD-10-CM | POA: Diagnosis not present

## 2021-10-29 DIAGNOSIS — J9601 Acute respiratory failure with hypoxia: Secondary | ICD-10-CM | POA: Diagnosis not present

## 2021-10-29 DIAGNOSIS — K219 Gastro-esophageal reflux disease without esophagitis: Secondary | ICD-10-CM | POA: Diagnosis not present

## 2021-10-29 DIAGNOSIS — D509 Iron deficiency anemia, unspecified: Secondary | ICD-10-CM | POA: Diagnosis not present

## 2021-10-29 DIAGNOSIS — J9811 Atelectasis: Secondary | ICD-10-CM | POA: Diagnosis not present

## 2021-10-29 DIAGNOSIS — C3401 Malignant neoplasm of right main bronchus: Secondary | ICD-10-CM | POA: Diagnosis not present

## 2021-10-29 DIAGNOSIS — E1142 Type 2 diabetes mellitus with diabetic polyneuropathy: Secondary | ICD-10-CM | POA: Diagnosis not present

## 2021-10-29 DIAGNOSIS — F17290 Nicotine dependence, other tobacco product, uncomplicated: Secondary | ICD-10-CM | POA: Diagnosis not present

## 2021-10-29 DIAGNOSIS — G20 Parkinson's disease: Secondary | ICD-10-CM | POA: Diagnosis not present

## 2021-10-29 DIAGNOSIS — K5900 Constipation, unspecified: Secondary | ICD-10-CM | POA: Diagnosis not present

## 2021-10-29 DIAGNOSIS — E1122 Type 2 diabetes mellitus with diabetic chronic kidney disease: Secondary | ICD-10-CM | POA: Diagnosis not present

## 2021-10-29 DIAGNOSIS — J309 Allergic rhinitis, unspecified: Secondary | ICD-10-CM | POA: Diagnosis not present

## 2021-10-29 DIAGNOSIS — M1990 Unspecified osteoarthritis, unspecified site: Secondary | ICD-10-CM | POA: Diagnosis not present

## 2021-10-30 DIAGNOSIS — G20 Parkinson's disease: Secondary | ICD-10-CM | POA: Diagnosis not present

## 2021-10-30 DIAGNOSIS — K219 Gastro-esophageal reflux disease without esophagitis: Secondary | ICD-10-CM | POA: Diagnosis not present

## 2021-10-30 DIAGNOSIS — K5900 Constipation, unspecified: Secondary | ICD-10-CM | POA: Diagnosis not present

## 2021-10-30 DIAGNOSIS — E782 Mixed hyperlipidemia: Secondary | ICD-10-CM | POA: Diagnosis not present

## 2021-10-30 DIAGNOSIS — C3401 Malignant neoplasm of right main bronchus: Secondary | ICD-10-CM | POA: Diagnosis not present

## 2021-10-30 DIAGNOSIS — E1122 Type 2 diabetes mellitus with diabetic chronic kidney disease: Secondary | ICD-10-CM | POA: Diagnosis not present

## 2021-10-30 DIAGNOSIS — N183 Chronic kidney disease, stage 3 unspecified: Secondary | ICD-10-CM | POA: Diagnosis not present

## 2021-10-30 DIAGNOSIS — F0283 Dementia in other diseases classified elsewhere, unspecified severity, with mood disturbance: Secondary | ICD-10-CM | POA: Diagnosis not present

## 2021-10-30 DIAGNOSIS — G4733 Obstructive sleep apnea (adult) (pediatric): Secondary | ICD-10-CM | POA: Diagnosis not present

## 2021-10-30 DIAGNOSIS — J309 Allergic rhinitis, unspecified: Secondary | ICD-10-CM | POA: Diagnosis not present

## 2021-10-30 DIAGNOSIS — J9601 Acute respiratory failure with hypoxia: Secondary | ICD-10-CM | POA: Diagnosis not present

## 2021-10-30 DIAGNOSIS — M5137 Other intervertebral disc degeneration, lumbosacral region: Secondary | ICD-10-CM | POA: Diagnosis not present

## 2021-10-30 DIAGNOSIS — I10 Essential (primary) hypertension: Secondary | ICD-10-CM | POA: Diagnosis not present

## 2021-10-30 DIAGNOSIS — F17290 Nicotine dependence, other tobacco product, uncomplicated: Secondary | ICD-10-CM | POA: Diagnosis not present

## 2021-10-30 DIAGNOSIS — J9811 Atelectasis: Secondary | ICD-10-CM | POA: Diagnosis not present

## 2021-10-30 DIAGNOSIS — F0284 Dementia in other diseases classified elsewhere, unspecified severity, with anxiety: Secondary | ICD-10-CM | POA: Diagnosis not present

## 2021-10-30 DIAGNOSIS — M1990 Unspecified osteoarthritis, unspecified site: Secondary | ICD-10-CM | POA: Diagnosis not present

## 2021-10-30 DIAGNOSIS — K30 Functional dyspepsia: Secondary | ICD-10-CM | POA: Diagnosis not present

## 2021-10-30 DIAGNOSIS — M5126 Other intervertebral disc displacement, lumbar region: Secondary | ICD-10-CM | POA: Diagnosis not present

## 2021-10-30 DIAGNOSIS — D509 Iron deficiency anemia, unspecified: Secondary | ICD-10-CM | POA: Diagnosis not present

## 2021-10-30 DIAGNOSIS — E1142 Type 2 diabetes mellitus with diabetic polyneuropathy: Secondary | ICD-10-CM | POA: Diagnosis not present

## 2021-10-31 DIAGNOSIS — N183 Chronic kidney disease, stage 3 unspecified: Secondary | ICD-10-CM | POA: Diagnosis not present

## 2021-10-31 DIAGNOSIS — E1142 Type 2 diabetes mellitus with diabetic polyneuropathy: Secondary | ICD-10-CM | POA: Diagnosis not present

## 2021-10-31 DIAGNOSIS — J9601 Acute respiratory failure with hypoxia: Secondary | ICD-10-CM | POA: Diagnosis not present

## 2021-10-31 DIAGNOSIS — J309 Allergic rhinitis, unspecified: Secondary | ICD-10-CM | POA: Diagnosis not present

## 2021-10-31 DIAGNOSIS — C3401 Malignant neoplasm of right main bronchus: Secondary | ICD-10-CM | POA: Diagnosis not present

## 2021-10-31 DIAGNOSIS — D509 Iron deficiency anemia, unspecified: Secondary | ICD-10-CM | POA: Diagnosis not present

## 2021-10-31 DIAGNOSIS — J9811 Atelectasis: Secondary | ICD-10-CM | POA: Diagnosis not present

## 2021-10-31 DIAGNOSIS — F17290 Nicotine dependence, other tobacco product, uncomplicated: Secondary | ICD-10-CM | POA: Diagnosis not present

## 2021-10-31 DIAGNOSIS — M5126 Other intervertebral disc displacement, lumbar region: Secondary | ICD-10-CM | POA: Diagnosis not present

## 2021-10-31 DIAGNOSIS — I10 Essential (primary) hypertension: Secondary | ICD-10-CM | POA: Diagnosis not present

## 2021-10-31 DIAGNOSIS — K30 Functional dyspepsia: Secondary | ICD-10-CM | POA: Diagnosis not present

## 2021-10-31 DIAGNOSIS — G4733 Obstructive sleep apnea (adult) (pediatric): Secondary | ICD-10-CM | POA: Diagnosis not present

## 2021-10-31 DIAGNOSIS — E782 Mixed hyperlipidemia: Secondary | ICD-10-CM | POA: Diagnosis not present

## 2021-10-31 DIAGNOSIS — M5137 Other intervertebral disc degeneration, lumbosacral region: Secondary | ICD-10-CM | POA: Diagnosis not present

## 2021-10-31 DIAGNOSIS — E1122 Type 2 diabetes mellitus with diabetic chronic kidney disease: Secondary | ICD-10-CM | POA: Diagnosis not present

## 2021-10-31 DIAGNOSIS — M1990 Unspecified osteoarthritis, unspecified site: Secondary | ICD-10-CM | POA: Diagnosis not present

## 2021-10-31 DIAGNOSIS — K5900 Constipation, unspecified: Secondary | ICD-10-CM | POA: Diagnosis not present

## 2021-10-31 DIAGNOSIS — F0284 Dementia in other diseases classified elsewhere, unspecified severity, with anxiety: Secondary | ICD-10-CM | POA: Diagnosis not present

## 2021-10-31 DIAGNOSIS — G20 Parkinson's disease: Secondary | ICD-10-CM | POA: Diagnosis not present

## 2021-10-31 DIAGNOSIS — F0283 Dementia in other diseases classified elsewhere, unspecified severity, with mood disturbance: Secondary | ICD-10-CM | POA: Diagnosis not present

## 2021-10-31 DIAGNOSIS — K219 Gastro-esophageal reflux disease without esophagitis: Secondary | ICD-10-CM | POA: Diagnosis not present

## 2021-11-02 ENCOUNTER — Telehealth: Payer: Self-pay

## 2021-11-02 NOTE — Telephone Encounter (Signed)
PT called to report falls from Randall Fisher  Fall: 2/8 & 2/9  Loss of balance, no injury

## 2021-11-05 DIAGNOSIS — F0284 Dementia in other diseases classified elsewhere, unspecified severity, with anxiety: Secondary | ICD-10-CM | POA: Diagnosis not present

## 2021-11-05 DIAGNOSIS — F17290 Nicotine dependence, other tobacco product, uncomplicated: Secondary | ICD-10-CM | POA: Diagnosis not present

## 2021-11-05 DIAGNOSIS — C3401 Malignant neoplasm of right main bronchus: Secondary | ICD-10-CM | POA: Diagnosis not present

## 2021-11-05 DIAGNOSIS — K30 Functional dyspepsia: Secondary | ICD-10-CM | POA: Diagnosis not present

## 2021-11-05 DIAGNOSIS — G20 Parkinson's disease: Secondary | ICD-10-CM | POA: Diagnosis not present

## 2021-11-05 DIAGNOSIS — E1122 Type 2 diabetes mellitus with diabetic chronic kidney disease: Secondary | ICD-10-CM | POA: Diagnosis not present

## 2021-11-05 DIAGNOSIS — D509 Iron deficiency anemia, unspecified: Secondary | ICD-10-CM | POA: Diagnosis not present

## 2021-11-05 DIAGNOSIS — E782 Mixed hyperlipidemia: Secondary | ICD-10-CM | POA: Diagnosis not present

## 2021-11-05 DIAGNOSIS — M1990 Unspecified osteoarthritis, unspecified site: Secondary | ICD-10-CM | POA: Diagnosis not present

## 2021-11-05 DIAGNOSIS — K5900 Constipation, unspecified: Secondary | ICD-10-CM | POA: Diagnosis not present

## 2021-11-05 DIAGNOSIS — G4733 Obstructive sleep apnea (adult) (pediatric): Secondary | ICD-10-CM | POA: Diagnosis not present

## 2021-11-05 DIAGNOSIS — M5137 Other intervertebral disc degeneration, lumbosacral region: Secondary | ICD-10-CM | POA: Diagnosis not present

## 2021-11-05 DIAGNOSIS — J9601 Acute respiratory failure with hypoxia: Secondary | ICD-10-CM | POA: Diagnosis not present

## 2021-11-05 DIAGNOSIS — M5126 Other intervertebral disc displacement, lumbar region: Secondary | ICD-10-CM | POA: Diagnosis not present

## 2021-11-05 DIAGNOSIS — K219 Gastro-esophageal reflux disease without esophagitis: Secondary | ICD-10-CM | POA: Diagnosis not present

## 2021-11-05 DIAGNOSIS — F0283 Dementia in other diseases classified elsewhere, unspecified severity, with mood disturbance: Secondary | ICD-10-CM | POA: Diagnosis not present

## 2021-11-05 DIAGNOSIS — J9811 Atelectasis: Secondary | ICD-10-CM | POA: Diagnosis not present

## 2021-11-05 DIAGNOSIS — J309 Allergic rhinitis, unspecified: Secondary | ICD-10-CM | POA: Diagnosis not present

## 2021-11-05 DIAGNOSIS — N183 Chronic kidney disease, stage 3 unspecified: Secondary | ICD-10-CM | POA: Diagnosis not present

## 2021-11-05 DIAGNOSIS — E1142 Type 2 diabetes mellitus with diabetic polyneuropathy: Secondary | ICD-10-CM | POA: Diagnosis not present

## 2021-11-05 DIAGNOSIS — I10 Essential (primary) hypertension: Secondary | ICD-10-CM | POA: Diagnosis not present

## 2021-11-06 ENCOUNTER — Telehealth: Payer: Self-pay

## 2021-11-06 NOTE — Telephone Encounter (Signed)
Recommend follow up visit.

## 2021-11-06 NOTE — Telephone Encounter (Signed)
Randall Fisher @ home health [facility] reporting another fall for the pt on Saturday 2/18. Pt denies LOC. Sore knees from the fall. States increased dizziness may be the cause of falls.   Please advise.

## 2021-11-07 DIAGNOSIS — F17290 Nicotine dependence, other tobacco product, uncomplicated: Secondary | ICD-10-CM | POA: Diagnosis not present

## 2021-11-07 DIAGNOSIS — F0284 Dementia in other diseases classified elsewhere, unspecified severity, with anxiety: Secondary | ICD-10-CM | POA: Diagnosis not present

## 2021-11-07 DIAGNOSIS — M1990 Unspecified osteoarthritis, unspecified site: Secondary | ICD-10-CM | POA: Diagnosis not present

## 2021-11-07 DIAGNOSIS — K30 Functional dyspepsia: Secondary | ICD-10-CM | POA: Diagnosis not present

## 2021-11-07 DIAGNOSIS — E1122 Type 2 diabetes mellitus with diabetic chronic kidney disease: Secondary | ICD-10-CM | POA: Diagnosis not present

## 2021-11-07 DIAGNOSIS — J9601 Acute respiratory failure with hypoxia: Secondary | ICD-10-CM | POA: Diagnosis not present

## 2021-11-07 DIAGNOSIS — M5126 Other intervertebral disc displacement, lumbar region: Secondary | ICD-10-CM | POA: Diagnosis not present

## 2021-11-07 DIAGNOSIS — K5900 Constipation, unspecified: Secondary | ICD-10-CM | POA: Diagnosis not present

## 2021-11-07 DIAGNOSIS — D509 Iron deficiency anemia, unspecified: Secondary | ICD-10-CM | POA: Diagnosis not present

## 2021-11-07 DIAGNOSIS — E782 Mixed hyperlipidemia: Secondary | ICD-10-CM | POA: Diagnosis not present

## 2021-11-07 DIAGNOSIS — E1142 Type 2 diabetes mellitus with diabetic polyneuropathy: Secondary | ICD-10-CM | POA: Diagnosis not present

## 2021-11-07 DIAGNOSIS — I10 Essential (primary) hypertension: Secondary | ICD-10-CM | POA: Diagnosis not present

## 2021-11-07 DIAGNOSIS — F0283 Dementia in other diseases classified elsewhere, unspecified severity, with mood disturbance: Secondary | ICD-10-CM | POA: Diagnosis not present

## 2021-11-07 DIAGNOSIS — K219 Gastro-esophageal reflux disease without esophagitis: Secondary | ICD-10-CM | POA: Diagnosis not present

## 2021-11-07 DIAGNOSIS — G4733 Obstructive sleep apnea (adult) (pediatric): Secondary | ICD-10-CM | POA: Diagnosis not present

## 2021-11-07 DIAGNOSIS — M5137 Other intervertebral disc degeneration, lumbosacral region: Secondary | ICD-10-CM | POA: Diagnosis not present

## 2021-11-07 DIAGNOSIS — J9811 Atelectasis: Secondary | ICD-10-CM | POA: Diagnosis not present

## 2021-11-07 DIAGNOSIS — N183 Chronic kidney disease, stage 3 unspecified: Secondary | ICD-10-CM | POA: Diagnosis not present

## 2021-11-07 DIAGNOSIS — J309 Allergic rhinitis, unspecified: Secondary | ICD-10-CM | POA: Diagnosis not present

## 2021-11-07 DIAGNOSIS — G20 Parkinson's disease: Secondary | ICD-10-CM | POA: Diagnosis not present

## 2021-11-07 DIAGNOSIS — C3401 Malignant neoplasm of right main bronchus: Secondary | ICD-10-CM | POA: Diagnosis not present

## 2021-11-08 NOTE — Telephone Encounter (Signed)
Patient has been scheduled for a f/u visit with PCP for next week. AM

## 2021-11-12 ENCOUNTER — Telehealth: Payer: Self-pay

## 2021-11-12 DIAGNOSIS — D509 Iron deficiency anemia, unspecified: Secondary | ICD-10-CM | POA: Diagnosis not present

## 2021-11-12 DIAGNOSIS — F17290 Nicotine dependence, other tobacco product, uncomplicated: Secondary | ICD-10-CM | POA: Diagnosis not present

## 2021-11-12 DIAGNOSIS — G4733 Obstructive sleep apnea (adult) (pediatric): Secondary | ICD-10-CM | POA: Diagnosis not present

## 2021-11-12 DIAGNOSIS — J309 Allergic rhinitis, unspecified: Secondary | ICD-10-CM | POA: Diagnosis not present

## 2021-11-12 DIAGNOSIS — K5900 Constipation, unspecified: Secondary | ICD-10-CM | POA: Diagnosis not present

## 2021-11-12 DIAGNOSIS — K30 Functional dyspepsia: Secondary | ICD-10-CM | POA: Diagnosis not present

## 2021-11-12 DIAGNOSIS — E1122 Type 2 diabetes mellitus with diabetic chronic kidney disease: Secondary | ICD-10-CM | POA: Diagnosis not present

## 2021-11-12 DIAGNOSIS — M5126 Other intervertebral disc displacement, lumbar region: Secondary | ICD-10-CM | POA: Diagnosis not present

## 2021-11-12 DIAGNOSIS — M1990 Unspecified osteoarthritis, unspecified site: Secondary | ICD-10-CM | POA: Diagnosis not present

## 2021-11-12 DIAGNOSIS — J9601 Acute respiratory failure with hypoxia: Secondary | ICD-10-CM | POA: Diagnosis not present

## 2021-11-12 DIAGNOSIS — C3401 Malignant neoplasm of right main bronchus: Secondary | ICD-10-CM | POA: Diagnosis not present

## 2021-11-12 DIAGNOSIS — M5137 Other intervertebral disc degeneration, lumbosacral region: Secondary | ICD-10-CM | POA: Diagnosis not present

## 2021-11-12 DIAGNOSIS — J9811 Atelectasis: Secondary | ICD-10-CM | POA: Diagnosis not present

## 2021-11-12 DIAGNOSIS — K219 Gastro-esophageal reflux disease without esophagitis: Secondary | ICD-10-CM | POA: Diagnosis not present

## 2021-11-12 DIAGNOSIS — F0283 Dementia in other diseases classified elsewhere, unspecified severity, with mood disturbance: Secondary | ICD-10-CM | POA: Diagnosis not present

## 2021-11-12 DIAGNOSIS — G20 Parkinson's disease: Secondary | ICD-10-CM | POA: Diagnosis not present

## 2021-11-12 DIAGNOSIS — N183 Chronic kidney disease, stage 3 unspecified: Secondary | ICD-10-CM | POA: Diagnosis not present

## 2021-11-12 DIAGNOSIS — F0284 Dementia in other diseases classified elsewhere, unspecified severity, with anxiety: Secondary | ICD-10-CM | POA: Diagnosis not present

## 2021-11-12 DIAGNOSIS — E1142 Type 2 diabetes mellitus with diabetic polyneuropathy: Secondary | ICD-10-CM | POA: Diagnosis not present

## 2021-11-12 DIAGNOSIS — E782 Mixed hyperlipidemia: Secondary | ICD-10-CM | POA: Diagnosis not present

## 2021-11-12 DIAGNOSIS — I10 Essential (primary) hypertension: Secondary | ICD-10-CM | POA: Diagnosis not present

## 2021-11-12 NOTE — Telephone Encounter (Signed)
Noted, thanks!

## 2021-11-12 NOTE — Telephone Encounter (Signed)
Randall Fisher had another fall this morning and hit his head. He has a small bump. Denies confusion, headaches, dizziness or loss of consciousness. He already has an appointment for Wednesday for falls.

## 2021-11-13 ENCOUNTER — Telehealth: Payer: Self-pay

## 2021-11-13 DIAGNOSIS — G4733 Obstructive sleep apnea (adult) (pediatric): Secondary | ICD-10-CM | POA: Diagnosis not present

## 2021-11-13 DIAGNOSIS — J9811 Atelectasis: Secondary | ICD-10-CM | POA: Diagnosis not present

## 2021-11-13 DIAGNOSIS — D509 Iron deficiency anemia, unspecified: Secondary | ICD-10-CM | POA: Diagnosis not present

## 2021-11-13 DIAGNOSIS — M1990 Unspecified osteoarthritis, unspecified site: Secondary | ICD-10-CM | POA: Diagnosis not present

## 2021-11-13 DIAGNOSIS — C3401 Malignant neoplasm of right main bronchus: Secondary | ICD-10-CM | POA: Diagnosis not present

## 2021-11-13 DIAGNOSIS — E782 Mixed hyperlipidemia: Secondary | ICD-10-CM | POA: Diagnosis not present

## 2021-11-13 DIAGNOSIS — E119 Type 2 diabetes mellitus without complications: Secondary | ICD-10-CM | POA: Diagnosis not present

## 2021-11-13 DIAGNOSIS — J9601 Acute respiratory failure with hypoxia: Secondary | ICD-10-CM | POA: Diagnosis not present

## 2021-11-13 DIAGNOSIS — F0284 Dementia in other diseases classified elsewhere, unspecified severity, with anxiety: Secondary | ICD-10-CM | POA: Diagnosis not present

## 2021-11-13 DIAGNOSIS — E1142 Type 2 diabetes mellitus with diabetic polyneuropathy: Secondary | ICD-10-CM | POA: Diagnosis not present

## 2021-11-13 DIAGNOSIS — K30 Functional dyspepsia: Secondary | ICD-10-CM | POA: Diagnosis not present

## 2021-11-13 DIAGNOSIS — M5137 Other intervertebral disc degeneration, lumbosacral region: Secondary | ICD-10-CM | POA: Diagnosis not present

## 2021-11-13 DIAGNOSIS — G20 Parkinson's disease: Secondary | ICD-10-CM | POA: Diagnosis not present

## 2021-11-13 DIAGNOSIS — N183 Chronic kidney disease, stage 3 unspecified: Secondary | ICD-10-CM | POA: Diagnosis not present

## 2021-11-13 DIAGNOSIS — I639 Cerebral infarction, unspecified: Secondary | ICD-10-CM | POA: Diagnosis not present

## 2021-11-13 DIAGNOSIS — I10 Essential (primary) hypertension: Secondary | ICD-10-CM | POA: Diagnosis not present

## 2021-11-13 DIAGNOSIS — J309 Allergic rhinitis, unspecified: Secondary | ICD-10-CM | POA: Diagnosis not present

## 2021-11-13 DIAGNOSIS — H4301 Vitreous prolapse, right eye: Secondary | ICD-10-CM | POA: Diagnosis not present

## 2021-11-13 DIAGNOSIS — K219 Gastro-esophageal reflux disease without esophagitis: Secondary | ICD-10-CM | POA: Diagnosis not present

## 2021-11-13 DIAGNOSIS — E1122 Type 2 diabetes mellitus with diabetic chronic kidney disease: Secondary | ICD-10-CM | POA: Diagnosis not present

## 2021-11-13 DIAGNOSIS — K5900 Constipation, unspecified: Secondary | ICD-10-CM | POA: Diagnosis not present

## 2021-11-13 DIAGNOSIS — H40051 Ocular hypertension, right eye: Secondary | ICD-10-CM | POA: Diagnosis not present

## 2021-11-13 DIAGNOSIS — F0283 Dementia in other diseases classified elsewhere, unspecified severity, with mood disturbance: Secondary | ICD-10-CM | POA: Diagnosis not present

## 2021-11-13 DIAGNOSIS — F17290 Nicotine dependence, other tobacco product, uncomplicated: Secondary | ICD-10-CM | POA: Diagnosis not present

## 2021-11-13 DIAGNOSIS — M5126 Other intervertebral disc displacement, lumbar region: Secondary | ICD-10-CM | POA: Diagnosis not present

## 2021-11-13 NOTE — Telephone Encounter (Signed)
Home Health contacted me to inform me they have discharged the patient from home PT. The PT stated the patient is not making any progress anymore and has not been doing his home exercises. She reports he is still falling at least once a day. Her recommendation is to do an impatient facility for further evaluation and treatment. The patient stated he would talk to his wife about this before following with PCP about this. Dr. Zigmund Daniel was made aware.

## 2021-11-14 ENCOUNTER — Telehealth (INDEPENDENT_AMBULATORY_CARE_PROVIDER_SITE_OTHER): Payer: Medicare Other | Admitting: Family Medicine

## 2021-11-14 ENCOUNTER — Encounter: Payer: Self-pay | Admitting: Family Medicine

## 2021-11-14 VITALS — BP 120/65 | Ht 69.0 in | Wt 242.0 lb

## 2021-11-14 DIAGNOSIS — G20 Parkinson's disease: Secondary | ICD-10-CM

## 2021-11-14 DIAGNOSIS — R269 Unspecified abnormalities of gait and mobility: Secondary | ICD-10-CM

## 2021-11-18 ENCOUNTER — Encounter: Payer: Self-pay | Admitting: Family Medicine

## 2021-11-18 NOTE — Assessment & Plan Note (Addendum)
Current management per neurology.  Concerned about MSA.  He is having increasing problems with balance, recently discharged from home PT due to failure to make progress.  He is not interested and skilled nursing or inpatient rehab.  He will consider attending outpatient rehab however currently transportation is an issue.   ?

## 2021-11-18 NOTE — Progress Notes (Signed)
Randall Fisher - 66 y.o. male MRN [id number]  Date of birth: Dec 06, 1955   This visit type was conducted due to national recommendations for restrictions regarding the COVID-19 Pandemic (e.g. social distancing).  This format is felt to be most appropriate for this patient at this time.  All issues noted in this document were discussed and addressed.  No physical exam was performed (except for noted visual exam findings with Video Visits).  I discussed the limitations of evaluation and management by telemedicine and the availability of in person appointments. The patient expressed understanding and agreed to proceed.  I connected withNAME@ on 11/18/21 at 11:10 AM EST by a video enabled telemedicine application and verified that I am speaking with the correct person using two identifiers.  Present at visit: Randall Nutting, DO Randall Fisher   Patient Location: Home  [address] [place] [address]   [facility]  Chief Complaint  Patient presents with   Follow-up    HPI  Randall Fisher is a 66 y.o. male who presents via audio/video conferencing for a telehealth visit today.  He is following up today for recurrent falls.  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  [facility] has been working with home physical therapy but continues to have recurrent problems with balance and falls.  He has been discharged from home physical therapy at this point as he is failing to make continued progress.  It was recommended he be admitted to an inpatient rehab facility or skilled nursing for rehab for more intensive rehab regimen.  He is not interested in doing this.    ROS:  A comprehensive ROS was completed and negative except as noted per HPI  Past Medical History:  Diagnosis Date   Back pain    Diabetes mellitus without complication (HCC)    Hyperlipidemia    Joint pain    Kidney disease    Leg swelling    Lung  cancer (HCC)    Memory loss    Neuropathy    Numbness and tingling in both hands    S/P lobectomy of lung 2013   RUL for NSCLC    Past Surgical History:  Procedure Laterality Date   CARPAL TUNNEL RELEASE Bilateral    LUNG LOBECTOMY Right 2013   TONSILLECTOMY      Family History  Problem Relation Age of Onset   Diabetes Mother    Kidney failure Father     Social History   Socioeconomic History   Marital status: Married    Spouse name: Randall Fisher   Number of children: 1   Years of education: some college   Highest education level: Not on file  Occupational History   Occupation: unemployed - laid off   Occupation: seeking disability  Tobacco Use   Smoking status: Former   Smokeless tobacco: Never  Substance and Sexual Activity   Alcohol use: Not Currently   Drug use: Never   Sexual activity: Yes    Partners: Female    Birth control/protection: Condom  Other Topics Concern   Not on file  Social History Narrative   Lives at home with his wife.   1-2 cups caffeine per day.   Right-handed.      Social Determinants of Health   Financial Resource Strain: Not on file  Food Insecurity: Not on file  Transportation Needs: Not on file  Physical Activity: Not on file  Stress: Not on file  Social Connections: Not on file  Intimate Partner Violence: Not on file     Current Outpatient Medications:    albuterol (PROVENTIL) (2.5 MG/3ML) 0.083% nebulizer solution, Take 3 mLs (2.5 mg total) by nebulization every 6 (six) hours as needed for wheezing or shortness of breath., Disp: 150 mL, Rfl: 1   budesonide (PULMICORT) 0.5 MG/2ML nebulizer solution, Take 0.5 mg by nebulization daily as needed., Disp: 100 mL, Rfl: 10   carbidopa-levodopa (SINEMET IR) 25-100 MG tablet, Take 2 tablets by mouth 3 (three) times daily., Disp: 540 tablet, Rfl: 3   donepezil (ARICEPT) 5 MG tablet, Take 1 tablet (5 mg total) by mouth at bedtime., Disp: 90 tablet, Rfl: 1   Dulaglutide (TRULICITY) 1.5  HB/7.1IR SOPN, INJECT 1.5 MG(0.5ML) UNDER THE SKIN ONCE A WEEK., Disp: , Rfl:    fenofibrate 160 MG tablet, Take 1 tablet (160 mg total) by mouth daily., Disp: 90 tablet, Rfl: 1   fexofenadine-pseudoephedrine (ALLEGRA-D) 60-120 MG 12 hr tablet, Take 1 tablet by mouth daily as needed., Disp: , Rfl:    fluticasone (FLONASE) 50 MCG/ACT nasal spray, Place 2 sprays into both nostrils daily., Disp: 1 g, Rfl: 0   fluticasone (FLOVENT HFA) 44 MCG/ACT inhaler, Inhale 2 puffs into the lungs in the morning and at bedtime., Disp: 1 each, Rfl: 12   fluticasone-salmeterol (ADVAIR DISKUS) 250-50 MCG/ACT AEPB, Inhale 1 puff into the lungs in the morning and at bedtime., Disp: 60 each, Rfl: 2   furosemide (LASIX) 20 MG tablet, Take 1 tablet (20 mg total) by mouth daily., Disp: 90 tablet, Rfl: 1   insulin degludec (TRESIBA FLEXTOUCH) 200 UNIT/ML FlexTouch Pen, Inject 120 Units into the skin. , Disp: , Rfl:    Insulin Pen Needle (BD PEN NEEDLE NANO U/F) 32G X 4 MM MISC, USE WITH LEVEMIR FLEXTOUCH DAILY, Disp: 200 each, Rfl: 2   latanoprost (XALATAN) 0.005 % ophthalmic solution, Place 1 drop into both eyes at bedtime., Disp: , Rfl:    Misc. Devices MISC, DX: Severe sleep apnea Start bipap 16/12 cm. Water with mask and supplies, Disp: , Rfl:    Multiple Vitamins-Minerals (MULTIVITAMIN ADULT, MINERALS, PO), Take 1 tablet by mouth., Disp: , Rfl:    NONFORMULARY OR COMPOUNDED ITEM, Please provide rolling walker.  Diagnosis: Gait instability, Disp: 1 each, Rfl: 0   nystatin-triamcinolone (MYCOLOG II) cream, Apply topically 2 (two) times daily., Disp: 30 g, Rfl: 1   omeprazole (PRILOSEC) 20 MG capsule, Take 1 capsule (20 mg total) by mouth daily., Disp: 90 capsule, Rfl: 1   pregabalin (LYRICA) 200 MG capsule, Take 200 mg by mouth 2 (two) times daily. , Disp: , Rfl:    Probiotic Product (PROBIOTIC PO), Take 1 tablet by mouth daily., Disp: , Rfl:    rOPINIRole (REQUIP XL) 2 MG 24 hr tablet, Take 2 tablets (4 mg total) by mouth  at bedtime., Disp: 180 tablet, Rfl: 3   rosuvastatin (CRESTOR) 10 MG tablet, Take 1 tablet (10 mg total) by mouth daily., Disp: 90 tablet, Rfl: 1   sertraline (ZOLOFT) 50 MG tablet, Take 50 mg by mouth daily., Disp: , Rfl:    tadalafil (CIALIS) 20 MG tablet, Take 0.5-1 tablets (10-20 mg total) by mouth every other day as needed (ED). Replaces sildenafil as tadalafil recently approved., Disp: 45 tablet, Rfl: 3   tamsulosin (FLOMAX) 0.4 MG CAPS capsule, Take 1 capsule (0.4 mg total) by mouth daily., Disp: 90 capsule, Rfl: 3   traMADol (ULTRAM) 50 MG tablet, Take 1 tablet (50  mg total) by mouth every 6 (six) hours as needed., Disp: 40 tablet, Rfl: 1   valACYclovir (VALTREX) 500 MG tablet, Take 500 mg by mouth daily., Disp: , Rfl:   EXAM:  VITALS per patient if applicable: BP 943/27    Ht 5\' 9"  (1.753 m)    Wt 242 lb (109.8 kg)    BMI 35.74 kg/m   GENERAL: alert, oriented, appears well and in no acute distress  HEENT: atraumatic, conjunttiva clear, no obvious abnormalities on inspection of external nose and ears  NECK: normal movements of the head and neck  LUNGS: on inspection no signs of respiratory distress, breathing rate appears normal, no obvious gross SOB, gasping or wheezing  CV: no obvious cyanosis  MS: moves all visible extremities without noticeable abnormality  PSYCH/NEURO: Some psychomotor slowing noted.  Masked facies present  ASSESSMENT AND PLAN:  Discussed the following assessment and plan:  Parkinsonism [facility]) Current management per neurology.  Concerned about MSA.  He is having increasing problems with balance, recently discharged from home PT due to failure to make progress.  He is not interested and skilled nursing or inpatient rehab.  He will consider attending outpatient rehab however currently transportation is an issue.       I discussed the assessment and treatment plan with the patient. The patient was provided an opportunity to ask questions and all were  answered. The patient agreed with the plan and demonstrated an understanding of the instructions.   The patient was advised to call back or seek an in-person evaluation if the symptoms worsen or if the condition fails to improve as anticipated.    Randall Nutting, DO

## 2021-11-22 ENCOUNTER — Ambulatory Visit: Payer: Medicare Other | Admitting: Neurology

## 2021-11-27 ENCOUNTER — Other Ambulatory Visit: Payer: Self-pay | Admitting: Family Medicine

## 2021-11-27 DIAGNOSIS — K30 Functional dyspepsia: Secondary | ICD-10-CM | POA: Diagnosis not present

## 2021-11-27 DIAGNOSIS — M5137 Other intervertebral disc degeneration, lumbosacral region: Secondary | ICD-10-CM | POA: Diagnosis not present

## 2021-11-27 DIAGNOSIS — I10 Essential (primary) hypertension: Secondary | ICD-10-CM | POA: Diagnosis not present

## 2021-11-27 DIAGNOSIS — G20 Parkinson's disease: Secondary | ICD-10-CM | POA: Diagnosis not present

## 2021-11-27 DIAGNOSIS — M5126 Other intervertebral disc displacement, lumbar region: Secondary | ICD-10-CM | POA: Diagnosis not present

## 2021-11-27 DIAGNOSIS — F0283 Dementia in other diseases classified elsewhere, unspecified severity, with mood disturbance: Secondary | ICD-10-CM | POA: Diagnosis not present

## 2021-11-27 DIAGNOSIS — J309 Allergic rhinitis, unspecified: Secondary | ICD-10-CM | POA: Diagnosis not present

## 2021-11-27 DIAGNOSIS — F0284 Dementia in other diseases classified elsewhere, unspecified severity, with anxiety: Secondary | ICD-10-CM | POA: Diagnosis not present

## 2021-11-27 DIAGNOSIS — E782 Mixed hyperlipidemia: Secondary | ICD-10-CM | POA: Diagnosis not present

## 2021-11-27 DIAGNOSIS — J9811 Atelectasis: Secondary | ICD-10-CM | POA: Diagnosis not present

## 2021-11-27 DIAGNOSIS — C3401 Malignant neoplasm of right main bronchus: Secondary | ICD-10-CM | POA: Diagnosis not present

## 2021-11-27 DIAGNOSIS — M1990 Unspecified osteoarthritis, unspecified site: Secondary | ICD-10-CM | POA: Diagnosis not present

## 2021-11-27 DIAGNOSIS — E1122 Type 2 diabetes mellitus with diabetic chronic kidney disease: Secondary | ICD-10-CM | POA: Diagnosis not present

## 2021-11-27 DIAGNOSIS — E1142 Type 2 diabetes mellitus with diabetic polyneuropathy: Secondary | ICD-10-CM | POA: Diagnosis not present

## 2021-11-27 DIAGNOSIS — D509 Iron deficiency anemia, unspecified: Secondary | ICD-10-CM | POA: Diagnosis not present

## 2021-11-27 DIAGNOSIS — N183 Chronic kidney disease, stage 3 unspecified: Secondary | ICD-10-CM | POA: Diagnosis not present

## 2021-11-27 DIAGNOSIS — J9601 Acute respiratory failure with hypoxia: Secondary | ICD-10-CM | POA: Diagnosis not present

## 2021-11-27 DIAGNOSIS — K219 Gastro-esophageal reflux disease without esophagitis: Secondary | ICD-10-CM | POA: Diagnosis not present

## 2021-11-27 DIAGNOSIS — K5900 Constipation, unspecified: Secondary | ICD-10-CM | POA: Diagnosis not present

## 2021-11-27 DIAGNOSIS — G4733 Obstructive sleep apnea (adult) (pediatric): Secondary | ICD-10-CM | POA: Diagnosis not present

## 2021-11-27 DIAGNOSIS — F17290 Nicotine dependence, other tobacco product, uncomplicated: Secondary | ICD-10-CM | POA: Diagnosis not present

## 2021-12-04 DIAGNOSIS — J9811 Atelectasis: Secondary | ICD-10-CM | POA: Diagnosis not present

## 2021-12-04 DIAGNOSIS — E1122 Type 2 diabetes mellitus with diabetic chronic kidney disease: Secondary | ICD-10-CM | POA: Diagnosis not present

## 2021-12-04 DIAGNOSIS — K219 Gastro-esophageal reflux disease without esophagitis: Secondary | ICD-10-CM | POA: Diagnosis not present

## 2021-12-04 DIAGNOSIS — M5137 Other intervertebral disc degeneration, lumbosacral region: Secondary | ICD-10-CM | POA: Diagnosis not present

## 2021-12-04 DIAGNOSIS — C3401 Malignant neoplasm of right main bronchus: Secondary | ICD-10-CM | POA: Diagnosis not present

## 2021-12-04 DIAGNOSIS — F0283 Dementia in other diseases classified elsewhere, unspecified severity, with mood disturbance: Secondary | ICD-10-CM | POA: Diagnosis not present

## 2021-12-04 DIAGNOSIS — J9601 Acute respiratory failure with hypoxia: Secondary | ICD-10-CM | POA: Diagnosis not present

## 2021-12-04 DIAGNOSIS — N183 Chronic kidney disease, stage 3 unspecified: Secondary | ICD-10-CM | POA: Diagnosis not present

## 2021-12-04 DIAGNOSIS — K5900 Constipation, unspecified: Secondary | ICD-10-CM | POA: Diagnosis not present

## 2021-12-04 DIAGNOSIS — J309 Allergic rhinitis, unspecified: Secondary | ICD-10-CM | POA: Diagnosis not present

## 2021-12-04 DIAGNOSIS — E1142 Type 2 diabetes mellitus with diabetic polyneuropathy: Secondary | ICD-10-CM | POA: Diagnosis not present

## 2021-12-04 DIAGNOSIS — F0284 Dementia in other diseases classified elsewhere, unspecified severity, with anxiety: Secondary | ICD-10-CM | POA: Diagnosis not present

## 2021-12-04 DIAGNOSIS — I10 Essential (primary) hypertension: Secondary | ICD-10-CM | POA: Diagnosis not present

## 2021-12-04 DIAGNOSIS — D509 Iron deficiency anemia, unspecified: Secondary | ICD-10-CM | POA: Diagnosis not present

## 2021-12-04 DIAGNOSIS — E782 Mixed hyperlipidemia: Secondary | ICD-10-CM | POA: Diagnosis not present

## 2021-12-04 DIAGNOSIS — G20 Parkinson's disease: Secondary | ICD-10-CM | POA: Diagnosis not present

## 2021-12-04 DIAGNOSIS — K30 Functional dyspepsia: Secondary | ICD-10-CM | POA: Diagnosis not present

## 2021-12-04 DIAGNOSIS — F17290 Nicotine dependence, other tobacco product, uncomplicated: Secondary | ICD-10-CM | POA: Diagnosis not present

## 2021-12-04 DIAGNOSIS — M1990 Unspecified osteoarthritis, unspecified site: Secondary | ICD-10-CM | POA: Diagnosis not present

## 2021-12-04 DIAGNOSIS — G4733 Obstructive sleep apnea (adult) (pediatric): Secondary | ICD-10-CM | POA: Diagnosis not present

## 2021-12-04 DIAGNOSIS — M5126 Other intervertebral disc displacement, lumbar region: Secondary | ICD-10-CM | POA: Diagnosis not present

## 2021-12-06 ENCOUNTER — Telehealth: Payer: Medicare Other | Admitting: Family

## 2021-12-06 DIAGNOSIS — J441 Chronic obstructive pulmonary disease with (acute) exacerbation: Secondary | ICD-10-CM | POA: Diagnosis not present

## 2021-12-06 MED ORDER — BENZONATATE 100 MG PO CAPS: 100.0000 mg | ORAL_CAPSULE | Freq: Three times a day (TID) | ORAL | 0 refills | Status: DC | PRN

## 2021-12-06 MED ORDER — AZITHROMYCIN 250 MG PO TABS: ORAL_TABLET | ORAL | 0 refills | Status: DC

## 2021-12-06 MED ORDER — PREDNISONE 10 MG (21) PO TBPK: ORAL_TABLET | ORAL | 0 refills | Status: DC

## 2021-12-06 NOTE — Progress Notes (Signed)
?Virtual Visit Consent  ? ?Randall Fisher, you are scheduled for a virtual visit with a [place] provider today.   ?  ?Just as with appointments in the office, your consent must be obtained to participate.  Your consent will be active for this visit and any virtual visit you may have with one of our providers in the next 365 days.   ?  ?If you have a MyChart account, a copy of this consent can be sent to you electronically.  All virtual visits are billed to your insurance company just like a traditional visit in the office.   ? ?As this is a virtual visit, video technology does not allow for your provider to perform a traditional examination.  This may limit your provider's ability to fully assess your condition.  If your provider identifies any concerns that need to be evaluated in person or the need to arrange testing (such as labs, EKG, etc.), we will make arrangements to do so.   ?  ?Although advances in technology are sophisticated, we cannot ensure that it will always work on either your end or our end.  If the connection with a video visit is poor, the visit may have to be switched to a telephone visit.  With either a video or telephone visit, we are not always able to ensure that we have a secure connection.    ? ?I need to obtain your verbal consent now.   Are you willing to proceed with your visit today?  ?  ?ROBY DONAWAY has provided verbal consent on 12/06/2021 for a virtual visit (video or telephone). ?  ?Evelina Dun, FNP  ? ?Date: 12/06/2021 4:32 PM ? ? ?Virtual Visit via Video Note  ? Randall Fisher, connected with  SAYVON ARTERBERRY  ([id number], Apr 18, 1958) on 12/06/21 at  4:30 PM EDT by a video-enabled telemedicine application and verified that I am speaking with the correct person using two identifiers. ? ?Location: ?Patient: Virtual Visit Location Patient: Home ?Provider: Virtual Visit Location Provider: Home Office ?  ?I discussed the limitations of evaluation and management by telemedicine  and the availability of in person appointments. The patient expressed understanding and agreed to proceed.   ? ?History of Present Illness: ?Randall Fisher is a 66 y.o. who identifies as a male who was assigned male at birth, and is being seen today for cough. ? ?HPI: Cough ?This is a new problem. The current episode started in the past 7 days. The problem has been gradually worsening. The problem occurs every few minutes. The cough is Productive of sputum. Associated symptoms include headaches, shortness of breath and wheezing. Pertinent negatives include no chills, ear congestion, ear pain, fever or myalgias. He has tried rest and OTC cough suppressant for the symptoms.   ?Problems:  ?Patient Active Problem List  ? Diagnosis Date Noted  ? Postobstructive pneumonia 07/26/2021  ? 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 Parkinsonism ([place]) 01/02/2021  ? Mild cognitive disorder 10/16/2020  ? Cyst of kidney, acquired 10/16/2020  ? Angina pectoris ([place]) 10/16/2020  ? Abnormal gait 10/16/2020  ? Spondylosis without myelopathy or radiculopathy, lumbar region 10/16/2020  ? Urinary urgency 09/24/2020  ? Right carpal tunnel syndrome 08/28/2020  ? Major depression 04/18/2020  ? Venous insufficiency 04/18/2020  ? Obstructive sleep apnea syndrome 02/24/2020  ? Sinobronchitis 02/24/2020  ? Acute bronchitis with COPD ([place]) 12/07/2019  ? Non-small cell lung cancer ([place]) 12/05/2019  ? Mild cognitive impairment 12/05/2019  ?  Gastro-esophageal reflux disease without esophagitis 12/01/2019  ? Hypertension 12/01/2019  ? Benign prostatic hyperplasia with lower urinary tract symptoms 07/05/2019  ? Diabetic peripheral neuropathy associated with type 2 diabetes mellitus ([place]) 07/05/2019  ? Diabetic renal disease ([place]) 07/05/2019  ? 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/05/2019  ? Stage 3 chronic kidney disease ([place]) 07/05/2019  ? Mild major depression, single episode ([place]) 07/05/2019  ? 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 Functional dyspepsia 07/05/2019  ? DM (diabetes mellitus) ([place]) 02/17/2013  ? HLD (hyperlipidemia) 02/17/2013  ?  ?Allergies:  ?Allergies  ?Allergen Reactions  ? Shellfish Allergy Swelling  ?  THROAT SWELLED/ HANDS SWELLED.  ? Mushroom Extract Complex Swelling  ? ?Medications:  ?Current Outpatient Medications:  ?  azithromycin (ZITHROMAX) 250 MG tablet, Take 500 mg once, then 250 mg for four days, Disp: 6 tablet, Rfl: 0 ?  benzonatate (TESSALON PERLES) 100 MG capsule, Take 1 capsule (100 mg total) by mouth 3 (three) times daily as needed., Disp: 20 capsule, Rfl: 0 ?  predniSONE (STERAPRED UNI-PAK 21 TAB) 10 MG (21) TBPK tablet, Use as directed, Disp: 21 tablet, Rfl: 0 ?  albuterol (PROVENTIL) (2.5 MG/3ML) 0.083% nebulizer solution, Take 3 mLs (2.5 mg total) by nebulization every 6 (six) hours as needed for wheezing or shortness of breath., Disp: 150 mL, Rfl: 1 ?  budesonide (PULMICORT) 0.5 MG/2ML nebulizer solution, Take 0.5 mg by nebulization daily as needed., Disp: 100 mL, Rfl: 10 ?  carbidopa-levodopa (SINEMET IR) 25-100 MG tablet, Take 2 tablets by mouth 3 (three) times daily., Disp: 540 tablet, Rfl: 3 ?  donepezil (ARICEPT) 5 MG tablet, Take 1 tablet (5 mg total) by mouth at bedtime., Disp: 90 tablet, Rfl: 1 ?  Dulaglutide (TRULICITY) 1.5 DT/2.6ZT SOPN, INJECT 1.5 MG(0.5ML) UNDER THE SKIN ONCE A WEEK., Disp: , Rfl:  ?  fenofibrate 160 MG tablet, Take 1 tablet (160 mg total) by mouth daily., Disp: 90 tablet, Rfl: 1 ?  fexofenadine-pseudoephedrine (ALLEGRA-D) 60-120 MG 12 hr tablet, Take 1 tablet by mouth daily as needed., Disp: , Rfl:  ?  fluticasone (FLONASE) 50 MCG/ACT nasal spray, Place 2 sprays into both nostrils daily., Disp: 1 g, Rfl: 0 ?  fluticasone (FLOVENT HFA) 44 MCG/ACT inhaler, Inhale 2 puffs into the lungs in the morning and at bedtime., Disp: 1 each, Rfl: 12 ?  furosemide (LASIX) 20 MG tablet, Take 1 tablet (20 mg total) by mouth daily., Disp: 90 tablet, Rfl: 1 ?   insulin degludec (TRESIBA FLEXTOUCH) 200 UNIT/ML FlexTouch Pen, Inject 120 Units into the skin. , Disp: , Rfl:  ?  Insulin Pen Needle (BD PEN NEEDLE NANO U/F) 32G X 4 MM MISC, USE WITH LEVEMIR FLEXTOUCH DAILY, Disp: 200 each, Rfl: 2 ?  latanoprost (XALATAN) 0.005 % ophthalmic solution, Place 1 drop into both eyes at bedtime., Disp: , Rfl:  ?  Misc. Devices MISC, DX: Severe sleep apnea Start bipap 16/12 cm. Water with mask and supplies, Disp: , Rfl:  ?  Multiple Vitamins-Minerals (MULTIVITAMIN ADULT, MINERALS, PO), Take 1 tablet by mouth., Disp: , Rfl:  ?  NONFORMULARY OR COMPOUNDED ITEM, Please provide rolling walker.  Diagnosis: Gait instability, Disp: 1 each, Rfl: 0 ?  nystatin-triamcinolone (MYCOLOG II) cream, Apply topically 2 (two) times daily., Disp: 30 g, Rfl: 1 ?  omeprazole (PRILOSEC) 20 MG capsule, Take 1 capsule (20 mg total) by mouth daily., Disp: 90 capsule, Rfl: 1 ?  pregabalin (LYRICA) 200 MG capsule, Take 200 mg by mouth 2 (two) times daily. ,  Disp: , Rfl:  ?  Probiotic Product (PROBIOTIC PO), Take 1 tablet by mouth daily., Disp: , Rfl:  ?  rOPINIRole (REQUIP XL) 2 MG 24 hr tablet, Take 2 tablets (4 mg total) by mouth at bedtime., Disp: 180 tablet, Rfl: 3 ?  rosuvastatin (CRESTOR) 10 MG tablet, Take 1 tablet (10 mg total) by mouth daily., Disp: 90 tablet, Rfl: 1 ?  sertraline (ZOLOFT) 50 MG tablet, Take 50 mg by mouth daily., Disp: , Rfl:  ?  tadalafil (CIALIS) 20 MG tablet, Take 0.5-1 tablets (10-20 mg total) by mouth every other day as needed (ED). Replaces sildenafil as tadalafil recently approved., Disp: 45 tablet, Rfl: 3 ?  tamsulosin (FLOMAX) 0.4 MG CAPS capsule, Take 1 capsule (0.4 mg total) by mouth daily., Disp: 90 capsule, Rfl: 3 ?  traMADol (ULTRAM) 50 MG tablet, Take 1 tablet (50 mg total) by mouth every 6 (six) hours as needed., Disp: 40 tablet, Rfl: 1 ?  valACYclovir (VALTREX) 500 MG tablet, Take 500 mg by mouth daily., Disp: , Rfl:  ?  WIXELA INHUB 250-50 MCG/ACT AEPB, USE 1 INHALATION  BY MOUTH IN THE MORNING AND AT BEDTIME, Disp: 180 each, Rfl: 3 ? ?Observations/Objective: ?Patient is well-developed, well-nourished in no acute distress.  ?Resting comfortably  at home.  ?Head is normocephalic, a

## 2021-12-07 ENCOUNTER — Telehealth: Payer: Self-pay | Admitting: Family Medicine

## 2021-12-07 NOTE — Telephone Encounter (Signed)
Patient called in to follow up on about being scheduled for Home PT per patient this was discussed during his visit with Dr. Zigmund Daniel on 3/1. Please advise. ?

## 2021-12-07 NOTE — Telephone Encounter (Signed)
Received call from [name] PT @ [facility] reporting 2 falls. ? ?3/18 & 3/20. Pt stated he lowered to his knees. No injury.  ? ?PT states she did a physical check and there was no injury.  ?

## 2021-12-10 DIAGNOSIS — E782 Mixed hyperlipidemia: Secondary | ICD-10-CM | POA: Diagnosis not present

## 2021-12-10 DIAGNOSIS — C3401 Malignant neoplasm of right main bronchus: Secondary | ICD-10-CM | POA: Diagnosis not present

## 2021-12-10 DIAGNOSIS — F17290 Nicotine dependence, other tobacco product, uncomplicated: Secondary | ICD-10-CM | POA: Diagnosis not present

## 2021-12-10 DIAGNOSIS — K30 Functional dyspepsia: Secondary | ICD-10-CM | POA: Diagnosis not present

## 2021-12-10 DIAGNOSIS — G20 Parkinson's disease: Secondary | ICD-10-CM | POA: Diagnosis not present

## 2021-12-10 DIAGNOSIS — F0283 Dementia in other diseases classified elsewhere, unspecified severity, with mood disturbance: Secondary | ICD-10-CM | POA: Diagnosis not present

## 2021-12-10 DIAGNOSIS — D509 Iron deficiency anemia, unspecified: Secondary | ICD-10-CM | POA: Diagnosis not present

## 2021-12-10 DIAGNOSIS — G4733 Obstructive sleep apnea (adult) (pediatric): Secondary | ICD-10-CM | POA: Diagnosis not present

## 2021-12-10 DIAGNOSIS — M5137 Other intervertebral disc degeneration, lumbosacral region: Secondary | ICD-10-CM | POA: Diagnosis not present

## 2021-12-10 DIAGNOSIS — K219 Gastro-esophageal reflux disease without esophagitis: Secondary | ICD-10-CM | POA: Diagnosis not present

## 2021-12-10 DIAGNOSIS — K5900 Constipation, unspecified: Secondary | ICD-10-CM | POA: Diagnosis not present

## 2021-12-10 DIAGNOSIS — I10 Essential (primary) hypertension: Secondary | ICD-10-CM | POA: Diagnosis not present

## 2021-12-10 DIAGNOSIS — M5126 Other intervertebral disc displacement, lumbar region: Secondary | ICD-10-CM | POA: Diagnosis not present

## 2021-12-10 DIAGNOSIS — M1990 Unspecified osteoarthritis, unspecified site: Secondary | ICD-10-CM | POA: Diagnosis not present

## 2021-12-10 DIAGNOSIS — E1122 Type 2 diabetes mellitus with diabetic chronic kidney disease: Secondary | ICD-10-CM | POA: Diagnosis not present

## 2021-12-10 DIAGNOSIS — E1142 Type 2 diabetes mellitus with diabetic polyneuropathy: Secondary | ICD-10-CM | POA: Diagnosis not present

## 2021-12-10 DIAGNOSIS — J9811 Atelectasis: Secondary | ICD-10-CM | POA: Diagnosis not present

## 2021-12-10 DIAGNOSIS — F0284 Dementia in other diseases classified elsewhere, unspecified severity, with anxiety: Secondary | ICD-10-CM | POA: Diagnosis not present

## 2021-12-10 DIAGNOSIS — J9601 Acute respiratory failure with hypoxia: Secondary | ICD-10-CM | POA: Diagnosis not present

## 2021-12-10 DIAGNOSIS — N183 Chronic kidney disease, stage 3 unspecified: Secondary | ICD-10-CM | POA: Diagnosis not present

## 2021-12-10 DIAGNOSIS — J309 Allergic rhinitis, unspecified: Secondary | ICD-10-CM | POA: Diagnosis not present

## 2021-12-10 NOTE — Telephone Encounter (Signed)
Noted, thanks!

## 2021-12-17 DIAGNOSIS — I10 Essential (primary) hypertension: Secondary | ICD-10-CM | POA: Diagnosis not present

## 2021-12-17 DIAGNOSIS — F0284 Dementia in other diseases classified elsewhere, unspecified severity, with anxiety: Secondary | ICD-10-CM | POA: Diagnosis not present

## 2021-12-17 DIAGNOSIS — K219 Gastro-esophageal reflux disease without esophagitis: Secondary | ICD-10-CM | POA: Diagnosis not present

## 2021-12-17 DIAGNOSIS — E1142 Type 2 diabetes mellitus with diabetic polyneuropathy: Secondary | ICD-10-CM | POA: Diagnosis not present

## 2021-12-17 DIAGNOSIS — M5137 Other intervertebral disc degeneration, lumbosacral region: Secondary | ICD-10-CM | POA: Diagnosis not present

## 2021-12-17 DIAGNOSIS — G4733 Obstructive sleep apnea (adult) (pediatric): Secondary | ICD-10-CM | POA: Diagnosis not present

## 2021-12-17 DIAGNOSIS — N183 Chronic kidney disease, stage 3 unspecified: Secondary | ICD-10-CM | POA: Diagnosis not present

## 2021-12-17 DIAGNOSIS — K30 Functional dyspepsia: Secondary | ICD-10-CM | POA: Diagnosis not present

## 2021-12-17 DIAGNOSIS — J309 Allergic rhinitis, unspecified: Secondary | ICD-10-CM | POA: Diagnosis not present

## 2021-12-17 DIAGNOSIS — G20 Parkinson's disease: Secondary | ICD-10-CM | POA: Diagnosis not present

## 2021-12-17 DIAGNOSIS — F411 Generalized anxiety disorder: Secondary | ICD-10-CM | POA: Diagnosis not present

## 2021-12-17 DIAGNOSIS — M1990 Unspecified osteoarthritis, unspecified site: Secondary | ICD-10-CM | POA: Diagnosis not present

## 2021-12-17 DIAGNOSIS — F0283 Dementia in other diseases classified elsewhere, unspecified severity, with mood disturbance: Secondary | ICD-10-CM | POA: Diagnosis not present

## 2021-12-17 DIAGNOSIS — M5126 Other intervertebral disc displacement, lumbar region: Secondary | ICD-10-CM | POA: Diagnosis not present

## 2021-12-17 DIAGNOSIS — C3401 Malignant neoplasm of right main bronchus: Secondary | ICD-10-CM | POA: Diagnosis not present

## 2021-12-17 DIAGNOSIS — J9811 Atelectasis: Secondary | ICD-10-CM | POA: Diagnosis not present

## 2021-12-17 DIAGNOSIS — D509 Iron deficiency anemia, unspecified: Secondary | ICD-10-CM | POA: Diagnosis not present

## 2021-12-17 DIAGNOSIS — J9601 Acute respiratory failure with hypoxia: Secondary | ICD-10-CM | POA: Diagnosis not present

## 2021-12-17 DIAGNOSIS — F17290 Nicotine dependence, other tobacco product, uncomplicated: Secondary | ICD-10-CM | POA: Diagnosis not present

## 2021-12-17 DIAGNOSIS — E782 Mixed hyperlipidemia: Secondary | ICD-10-CM | POA: Diagnosis not present

## 2021-12-17 DIAGNOSIS — E1122 Type 2 diabetes mellitus with diabetic chronic kidney disease: Secondary | ICD-10-CM | POA: Diagnosis not present

## 2021-12-17 DIAGNOSIS — F322 Major depressive disorder, single episode, severe without psychotic features: Secondary | ICD-10-CM | POA: Diagnosis not present

## 2021-12-17 DIAGNOSIS — K5900 Constipation, unspecified: Secondary | ICD-10-CM | POA: Diagnosis not present

## 2021-12-28 DIAGNOSIS — I10 Essential (primary) hypertension: Secondary | ICD-10-CM | POA: Diagnosis not present

## 2021-12-28 DIAGNOSIS — J309 Allergic rhinitis, unspecified: Secondary | ICD-10-CM | POA: Diagnosis not present

## 2021-12-28 DIAGNOSIS — K219 Gastro-esophageal reflux disease without esophagitis: Secondary | ICD-10-CM | POA: Diagnosis not present

## 2021-12-28 DIAGNOSIS — N183 Chronic kidney disease, stage 3 unspecified: Secondary | ICD-10-CM | POA: Diagnosis not present

## 2021-12-28 DIAGNOSIS — G4733 Obstructive sleep apnea (adult) (pediatric): Secondary | ICD-10-CM | POA: Diagnosis not present

## 2021-12-28 DIAGNOSIS — M5137 Other intervertebral disc degeneration, lumbosacral region: Secondary | ICD-10-CM | POA: Diagnosis not present

## 2021-12-28 DIAGNOSIS — E1122 Type 2 diabetes mellitus with diabetic chronic kidney disease: Secondary | ICD-10-CM | POA: Diagnosis not present

## 2021-12-28 DIAGNOSIS — M5126 Other intervertebral disc displacement, lumbar region: Secondary | ICD-10-CM | POA: Diagnosis not present

## 2021-12-28 DIAGNOSIS — C3401 Malignant neoplasm of right main bronchus: Secondary | ICD-10-CM | POA: Diagnosis not present

## 2021-12-28 DIAGNOSIS — E782 Mixed hyperlipidemia: Secondary | ICD-10-CM | POA: Diagnosis not present

## 2021-12-28 DIAGNOSIS — J9601 Acute respiratory failure with hypoxia: Secondary | ICD-10-CM | POA: Diagnosis not present

## 2021-12-28 DIAGNOSIS — J9811 Atelectasis: Secondary | ICD-10-CM | POA: Diagnosis not present

## 2021-12-28 DIAGNOSIS — F17290 Nicotine dependence, other tobacco product, uncomplicated: Secondary | ICD-10-CM | POA: Diagnosis not present

## 2021-12-28 DIAGNOSIS — D509 Iron deficiency anemia, unspecified: Secondary | ICD-10-CM | POA: Diagnosis not present

## 2021-12-28 DIAGNOSIS — F0283 Dementia in other diseases classified elsewhere, unspecified severity, with mood disturbance: Secondary | ICD-10-CM | POA: Diagnosis not present

## 2021-12-28 DIAGNOSIS — E1142 Type 2 diabetes mellitus with diabetic polyneuropathy: Secondary | ICD-10-CM | POA: Diagnosis not present

## 2021-12-28 DIAGNOSIS — G20 Parkinson's disease: Secondary | ICD-10-CM | POA: Diagnosis not present

## 2021-12-28 DIAGNOSIS — K5900 Constipation, unspecified: Secondary | ICD-10-CM | POA: Diagnosis not present

## 2021-12-28 DIAGNOSIS — M1990 Unspecified osteoarthritis, unspecified site: Secondary | ICD-10-CM | POA: Diagnosis not present

## 2021-12-28 DIAGNOSIS — K30 Functional dyspepsia: Secondary | ICD-10-CM | POA: Diagnosis not present

## 2021-12-28 DIAGNOSIS — F0284 Dementia in other diseases classified elsewhere, unspecified severity, with anxiety: Secondary | ICD-10-CM | POA: Diagnosis not present

## 2021-12-31 ENCOUNTER — Encounter: Payer: Self-pay | Admitting: Family Medicine

## 2021-12-31 ENCOUNTER — Ambulatory Visit (INDEPENDENT_AMBULATORY_CARE_PROVIDER_SITE_OTHER): Payer: Medicare Other | Admitting: Family Medicine

## 2021-12-31 VITALS — BP 127/75 | HR 74 | Ht 69.0 in | Wt 250.0 lb

## 2021-12-31 DIAGNOSIS — H547 Unspecified visual loss: Secondary | ICD-10-CM

## 2021-12-31 DIAGNOSIS — R269 Unspecified abnormalities of gait and mobility: Secondary | ICD-10-CM

## 2021-12-31 DIAGNOSIS — E1122 Type 2 diabetes mellitus with diabetic chronic kidney disease: Secondary | ICD-10-CM | POA: Diagnosis not present

## 2021-12-31 DIAGNOSIS — Z794 Long term (current) use of insulin: Secondary | ICD-10-CM

## 2021-12-31 DIAGNOSIS — N184 Chronic kidney disease, stage 4 (severe): Secondary | ICD-10-CM

## 2021-12-31 DIAGNOSIS — M47816 Spondylosis without myelopathy or radiculopathy, lumbar region: Secondary | ICD-10-CM | POA: Diagnosis not present

## 2021-12-31 DIAGNOSIS — G20 Parkinson's disease: Secondary | ICD-10-CM

## 2021-12-31 DIAGNOSIS — I10 Essential (primary) hypertension: Secondary | ICD-10-CM

## 2021-12-31 MED ORDER — PREDNISONE 50 MG PO TABS: ORAL_TABLET | ORAL | 0 refills | Status: DC

## 2021-12-31 MED ORDER — AMBULATORY NON FORMULARY MEDICATION: 0 refills | Status: DC

## 2021-12-31 MED ORDER — TRAMADOL HCL 50 MG PO TABS: 50.0000 mg | ORAL_TABLET | Freq: Four times a day (QID) | ORAL | 1 refills | Status: DC | PRN

## 2021-12-31 NOTE — Assessment & Plan Note (Signed)
Referral placed to interventional pain management. ?

## 2021-12-31 NOTE — Assessment & Plan Note (Signed)
Blood pressure remains well controlled at this time.  Recommend continuation of current antihypertensive medications. ?

## 2021-12-31 NOTE — Assessment & Plan Note (Signed)
Vision loss of the right eye.  Seen ophthalmology and told that he likely had a stroke.  MRI recommended by ophthalmology and ordered today. ?

## 2021-12-31 NOTE — Progress Notes (Signed)
?Randall Fisher - 66 y.o. male MRN [id number]  Date of birth: 27-Jun-1956 ? ?Subjective ?No chief complaint on file. ? ? ?HPI ?Randall Fisher is a 66 year old male here today for follow-up visit.  He has a few concerns that he would like to discuss. ? ?Continues to have difficulty with ambulation.  He was receiving home health physical therapy however was discharged due to failure to make progress and recurrent falls.  He continues to have lower extremity weakness and falls.  He would like to try physical therapy again with another home health group.  Additionally he has had problems with low back pain.  Current management with max dose of Lyrica and tramadol.  Does not feel like this is adequate.  He feels like this may be contributing some to his gait abnormalities and falls as well.  He has seen orthopedics in the past and had injections which helped briefly.  He would like referral to interventional pain specialist. ? ?He was seen by ophthalmology due to vision loss in his right eye.   ?He was told by ophthalmology that he likely had a stroke.  His ophthalmologist recommend that his PCP ordered MRI for follow-up on this. ? ?He is requesting a lightweight wheelchair as well due to inability to propel himself, as well as difficulty with his wife propelling him and getting wheelchair in and out of vehicle. ? ?ROS:  A comprehensive ROS was completed and negative except as noted per HPI ? ? ?Allergies  ?Allergen Reactions  ? Shellfish Allergy Swelling  ?  THROAT SWELLED/ HANDS SWELLED.  ? Mushroom Extract Complex Swelling  ? ? ?Past Medical History:  ?Diagnosis Date  ? Back pain   ? Diabetes mellitus without complication ([place])   ? Hyperlipidemia   ? Joint pain   ? Kidney disease   ? Leg swelling   ? Lung cancer ([place])   ? Memory loss   ? Neuropathy   ? Numbness and tingling in both hands   ? S/P lobectomy of lung 2013  ? RUL for NSCLC  ? ? ?Past Surgical History:  ?Procedure Laterality Date  ? CARPAL TUNNEL RELEASE Bilateral   ?  LUNG LOBECTOMY Right 2013  ? TONSILLECTOMY    ? ? ?Social History  ? ?Socioeconomic History  ? Marital status: Married  ?  Spouse name: Randall Fisher  ? Number of children: 1  ? Years of education: some college  ? Highest education level: Not on file  ?Occupational History  ? Occupation: unemployed - laid off  ? Occupation: seeking disability  ?Tobacco Use  ? Smoking status: Former  ? Smokeless tobacco: Never  ?Substance and Sexual Activity  ? Alcohol use: Not Currently  ? Drug use: Never  ? Sexual activity: Yes  ?  Partners: Female  ?  Birth control/protection: Condom  ?Other Topics Concern  ? Not on file  ?Social History Narrative  ? Lives at home with his wife.  ? 1-2 cups caffeine per day.  ? Right-handed.  ?   ? ?Social Determinants of Health  ? ?Financial Resource Strain: Not on file  ?Food Insecurity: Not on file  ?Transportation Needs: Not on file  ?Physical Activity: Not on file  ?Stress: Not on file  ?Social Connections: Not on file  ? ? ?Family History  ?Problem Relation Age of Onset  ? Diabetes Mother   ? Kidney failure Father   ? ? ?Health Maintenance  ?Topic Date Due  ? URINE MICROALBUMIN  Never done  ?  Hepatitis C Screening  Never done  ? Zoster Vaccines- Shingrix (1 of 2) Never done  ? HEMOGLOBIN A1C  05/11/2020  ? OPHTHALMOLOGY EXAM  09/16/2020  ? COVID-19 Vaccine (4 - Booster for Moderna series) 10/01/2020  ? INFLUENZA VACCINE  04/16/2022  ? FOOT EXAM  07/26/2022  ? TETANUS/TDAP  07/16/2026  ? COLONOSCOPY (Pts 45-58yrs Insurance coverage will need to be confirmed)  08/17/2029  ? HPV VACCINES  Aged Out  ? HIV Screening  Discontinued  ? ? ? ?----------------------------------------------------------------------------------------------------------------------------------------------------------------------------------------------------------------- ?Physical Exam ?BP 127/75 (BP Location: Left Arm, Patient Position: Sitting, Cuff Size: Large)   Pulse 74   Ht 5\' 9"  (1.753 m)   Wt 250 lb (113.4 kg)   SpO2 98%    BMI 36.92 kg/m?  ? ?Physical Exam ?Constitutional:   ?   Appearance: Normal appearance.  ?   Comments: In wheelchair.  ?Eyes:  ?   General: No scleral icterus. ?Musculoskeletal:  ?   Cervical back: Neck supple.  ?Neurological:  ?   Mental Status: He is alert.  ?Psychiatric:     ?   Mood and Affect: Mood normal.     ?   Behavior: Behavior normal.  ? ? ?------------------------------------------------------------------------------------------------------------------------------------------------------------------------------------------------------------------- ?Assessment and Plan ? ?Hypertension ?Blood pressure remains well controlled at this time.  Recommend continuation of current antihypertensive medications. ? ?DM (diabetes mellitus) ([place]) ?Poorly controlled diabetes.  Currently managed by endocrinology. ? ?Parkinsonism ([place]) ?Management by neurology.  Concerned about MSA.  We will continue Sinemet at current strength for now. ? ?Vision loss ?Vision loss of the right eye.  Seen ophthalmology and told that he likely had a stroke.  MRI recommended by ophthalmology and ordered today. ? ?Abnormal gait ?Gait instability related to Parkinson's as well as chronic low back pain.  Referral entered for home health physical therapy.  Continue to use assistive devices.  Likely wheelchair ordered. ? ?Spondylosis without myelopathy or radiculopathy, lumbar region ?Referral placed to interventional pain management. ? ? ?No orders of the defined types were placed in this encounter. ? ? ?No follow-ups on file. ? ? ? ?This visit occurred during the SARS-CoV-2 public health emergency.  Safety protocols were in place, including screening questions prior to the visit, additional usage of staff PPE, and extensive cleaning of exam room while observing appropriate contact time as indicated for disinfecting solutions.  ? ?

## 2021-12-31 NOTE — Assessment & Plan Note (Signed)
Management by neurology.  Concerned about MSA.  We will continue Sinemet at current strength for now. ?

## 2021-12-31 NOTE — Assessment & Plan Note (Signed)
Poorly controlled diabetes.  Currently managed by endocrinology. ?

## 2021-12-31 NOTE — Assessment & Plan Note (Signed)
Gait instability related to Parkinson's as well as chronic low back pain.  Referral entered for home health physical therapy.  Continue to use assistive devices.  Likely wheelchair ordered. ?

## 2022-01-04 DIAGNOSIS — M5137 Other intervertebral disc degeneration, lumbosacral region: Secondary | ICD-10-CM | POA: Diagnosis not present

## 2022-01-04 DIAGNOSIS — J309 Allergic rhinitis, unspecified: Secondary | ICD-10-CM | POA: Diagnosis not present

## 2022-01-04 DIAGNOSIS — N183 Chronic kidney disease, stage 3 unspecified: Secondary | ICD-10-CM | POA: Diagnosis not present

## 2022-01-04 DIAGNOSIS — J9601 Acute respiratory failure with hypoxia: Secondary | ICD-10-CM | POA: Diagnosis not present

## 2022-01-04 DIAGNOSIS — M5126 Other intervertebral disc displacement, lumbar region: Secondary | ICD-10-CM | POA: Diagnosis not present

## 2022-01-04 DIAGNOSIS — F0283 Dementia in other diseases classified elsewhere, unspecified severity, with mood disturbance: Secondary | ICD-10-CM | POA: Diagnosis not present

## 2022-01-04 DIAGNOSIS — E1122 Type 2 diabetes mellitus with diabetic chronic kidney disease: Secondary | ICD-10-CM | POA: Diagnosis not present

## 2022-01-04 DIAGNOSIS — E1142 Type 2 diabetes mellitus with diabetic polyneuropathy: Secondary | ICD-10-CM | POA: Diagnosis not present

## 2022-01-04 DIAGNOSIS — F17290 Nicotine dependence, other tobacco product, uncomplicated: Secondary | ICD-10-CM | POA: Diagnosis not present

## 2022-01-04 DIAGNOSIS — G20 Parkinson's disease: Secondary | ICD-10-CM | POA: Diagnosis not present

## 2022-01-04 DIAGNOSIS — G4733 Obstructive sleep apnea (adult) (pediatric): Secondary | ICD-10-CM | POA: Diagnosis not present

## 2022-01-04 DIAGNOSIS — K219 Gastro-esophageal reflux disease without esophagitis: Secondary | ICD-10-CM | POA: Diagnosis not present

## 2022-01-04 DIAGNOSIS — D509 Iron deficiency anemia, unspecified: Secondary | ICD-10-CM | POA: Diagnosis not present

## 2022-01-04 DIAGNOSIS — F0284 Dementia in other diseases classified elsewhere, unspecified severity, with anxiety: Secondary | ICD-10-CM | POA: Diagnosis not present

## 2022-01-04 DIAGNOSIS — K30 Functional dyspepsia: Secondary | ICD-10-CM | POA: Diagnosis not present

## 2022-01-04 DIAGNOSIS — C3401 Malignant neoplasm of right main bronchus: Secondary | ICD-10-CM | POA: Diagnosis not present

## 2022-01-04 DIAGNOSIS — K5900 Constipation, unspecified: Secondary | ICD-10-CM | POA: Diagnosis not present

## 2022-01-04 DIAGNOSIS — I10 Essential (primary) hypertension: Secondary | ICD-10-CM | POA: Diagnosis not present

## 2022-01-04 DIAGNOSIS — J9811 Atelectasis: Secondary | ICD-10-CM | POA: Diagnosis not present

## 2022-01-04 DIAGNOSIS — E782 Mixed hyperlipidemia: Secondary | ICD-10-CM | POA: Diagnosis not present

## 2022-01-04 DIAGNOSIS — M1990 Unspecified osteoarthritis, unspecified site: Secondary | ICD-10-CM | POA: Diagnosis not present

## 2022-01-05 ENCOUNTER — Ambulatory Visit (INDEPENDENT_AMBULATORY_CARE_PROVIDER_SITE_OTHER): Payer: Medicare Other

## 2022-01-05 DIAGNOSIS — R410 Disorientation, unspecified: Secondary | ICD-10-CM | POA: Diagnosis not present

## 2022-01-05 DIAGNOSIS — R262 Difficulty in walking, not elsewhere classified: Secondary | ICD-10-CM | POA: Diagnosis not present

## 2022-01-05 DIAGNOSIS — R269 Unspecified abnormalities of gait and mobility: Secondary | ICD-10-CM | POA: Diagnosis not present

## 2022-01-05 DIAGNOSIS — H547 Unspecified visual loss: Secondary | ICD-10-CM

## 2022-01-05 DIAGNOSIS — R519 Headache, unspecified: Secondary | ICD-10-CM | POA: Diagnosis not present

## 2022-01-05 DIAGNOSIS — R531 Weakness: Secondary | ICD-10-CM | POA: Diagnosis not present

## 2022-01-07 DIAGNOSIS — J309 Allergic rhinitis, unspecified: Secondary | ICD-10-CM | POA: Diagnosis not present

## 2022-01-07 DIAGNOSIS — K5900 Constipation, unspecified: Secondary | ICD-10-CM | POA: Diagnosis not present

## 2022-01-07 DIAGNOSIS — E782 Mixed hyperlipidemia: Secondary | ICD-10-CM | POA: Diagnosis not present

## 2022-01-07 DIAGNOSIS — C3401 Malignant neoplasm of right main bronchus: Secondary | ICD-10-CM | POA: Diagnosis not present

## 2022-01-07 DIAGNOSIS — I10 Essential (primary) hypertension: Secondary | ICD-10-CM | POA: Diagnosis not present

## 2022-01-07 DIAGNOSIS — F0283 Dementia in other diseases classified elsewhere, unspecified severity, with mood disturbance: Secondary | ICD-10-CM | POA: Diagnosis not present

## 2022-01-07 DIAGNOSIS — K219 Gastro-esophageal reflux disease without esophagitis: Secondary | ICD-10-CM | POA: Diagnosis not present

## 2022-01-07 DIAGNOSIS — E1122 Type 2 diabetes mellitus with diabetic chronic kidney disease: Secondary | ICD-10-CM | POA: Diagnosis not present

## 2022-01-07 DIAGNOSIS — K30 Functional dyspepsia: Secondary | ICD-10-CM | POA: Diagnosis not present

## 2022-01-07 DIAGNOSIS — N183 Chronic kidney disease, stage 3 unspecified: Secondary | ICD-10-CM | POA: Diagnosis not present

## 2022-01-07 DIAGNOSIS — D509 Iron deficiency anemia, unspecified: Secondary | ICD-10-CM | POA: Diagnosis not present

## 2022-01-07 DIAGNOSIS — J9811 Atelectasis: Secondary | ICD-10-CM | POA: Diagnosis not present

## 2022-01-07 DIAGNOSIS — G4733 Obstructive sleep apnea (adult) (pediatric): Secondary | ICD-10-CM | POA: Diagnosis not present

## 2022-01-07 DIAGNOSIS — M5126 Other intervertebral disc displacement, lumbar region: Secondary | ICD-10-CM | POA: Diagnosis not present

## 2022-01-07 DIAGNOSIS — F17290 Nicotine dependence, other tobacco product, uncomplicated: Secondary | ICD-10-CM | POA: Diagnosis not present

## 2022-01-07 DIAGNOSIS — J9601 Acute respiratory failure with hypoxia: Secondary | ICD-10-CM | POA: Diagnosis not present

## 2022-01-07 DIAGNOSIS — M1990 Unspecified osteoarthritis, unspecified site: Secondary | ICD-10-CM | POA: Diagnosis not present

## 2022-01-07 DIAGNOSIS — G20 Parkinson's disease: Secondary | ICD-10-CM | POA: Diagnosis not present

## 2022-01-07 DIAGNOSIS — E1142 Type 2 diabetes mellitus with diabetic polyneuropathy: Secondary | ICD-10-CM | POA: Diagnosis not present

## 2022-01-07 DIAGNOSIS — M5137 Other intervertebral disc degeneration, lumbosacral region: Secondary | ICD-10-CM | POA: Diagnosis not present

## 2022-01-07 DIAGNOSIS — F0284 Dementia in other diseases classified elsewhere, unspecified severity, with anxiety: Secondary | ICD-10-CM | POA: Diagnosis not present

## 2022-01-08 DIAGNOSIS — Z466 Encounter for fitting and adjustment of urinary device: Secondary | ICD-10-CM | POA: Diagnosis not present

## 2022-01-08 DIAGNOSIS — K760 Fatty (change of) liver, not elsewhere classified: Secondary | ICD-10-CM | POA: Diagnosis not present

## 2022-01-08 DIAGNOSIS — M549 Dorsalgia, unspecified: Secondary | ICD-10-CM | POA: Diagnosis not present

## 2022-01-08 DIAGNOSIS — R0781 Pleurodynia: Secondary | ICD-10-CM | POA: Diagnosis not present

## 2022-01-08 DIAGNOSIS — Z789 Other specified health status: Secondary | ICD-10-CM | POA: Diagnosis not present

## 2022-01-08 DIAGNOSIS — J9601 Acute respiratory failure with hypoxia: Secondary | ICD-10-CM | POA: Diagnosis not present

## 2022-01-08 DIAGNOSIS — W1839XA Other fall on same level, initial encounter: Secondary | ICD-10-CM | POA: Diagnosis not present

## 2022-01-08 DIAGNOSIS — R0609 Other forms of dyspnea: Secondary | ICD-10-CM | POA: Diagnosis not present

## 2022-01-08 DIAGNOSIS — Z87891 Personal history of nicotine dependence: Secondary | ICD-10-CM | POA: Diagnosis not present

## 2022-01-08 DIAGNOSIS — E559 Vitamin D deficiency, unspecified: Secondary | ICD-10-CM | POA: Diagnosis not present

## 2022-01-08 DIAGNOSIS — M545 Low back pain, unspecified: Secondary | ICD-10-CM | POA: Diagnosis not present

## 2022-01-08 DIAGNOSIS — K219 Gastro-esophageal reflux disease without esophagitis: Secondary | ICD-10-CM | POA: Diagnosis not present

## 2022-01-08 DIAGNOSIS — Z85118 Personal history of other malignant neoplasm of bronchus and lung: Secondary | ICD-10-CM | POA: Diagnosis not present

## 2022-01-08 DIAGNOSIS — R338 Other retention of urine: Secondary | ICD-10-CM | POA: Diagnosis not present

## 2022-01-08 DIAGNOSIS — J449 Chronic obstructive pulmonary disease, unspecified: Secondary | ICD-10-CM | POA: Diagnosis not present

## 2022-01-08 DIAGNOSIS — R262 Difficulty in walking, not elsewhere classified: Secondary | ICD-10-CM | POA: Diagnosis not present

## 2022-01-08 DIAGNOSIS — J069 Acute upper respiratory infection, unspecified: Secondary | ICD-10-CM | POA: Diagnosis not present

## 2022-01-08 DIAGNOSIS — Z743 Need for continuous supervision: Secondary | ICD-10-CM | POA: Diagnosis not present

## 2022-01-08 DIAGNOSIS — Z7401 Bed confinement status: Secondary | ICD-10-CM | POA: Diagnosis not present

## 2022-01-08 DIAGNOSIS — Z9181 History of falling: Secondary | ICD-10-CM | POA: Diagnosis not present

## 2022-01-08 DIAGNOSIS — R059 Cough, unspecified: Secondary | ICD-10-CM | POA: Diagnosis not present

## 2022-01-08 DIAGNOSIS — E785 Hyperlipidemia, unspecified: Secondary | ICD-10-CM | POA: Diagnosis not present

## 2022-01-08 DIAGNOSIS — Z902 Acquired absence of lung [part of]: Secondary | ICD-10-CM | POA: Diagnosis not present

## 2022-01-08 DIAGNOSIS — Z7409 Other reduced mobility: Secondary | ICD-10-CM | POA: Diagnosis not present

## 2022-01-08 DIAGNOSIS — Z794 Long term (current) use of insulin: Secondary | ICD-10-CM | POA: Diagnosis not present

## 2022-01-08 DIAGNOSIS — R339 Retention of urine, unspecified: Secondary | ICD-10-CM | POA: Diagnosis not present

## 2022-01-08 DIAGNOSIS — R1313 Dysphagia, pharyngeal phase: Secondary | ICD-10-CM | POA: Diagnosis not present

## 2022-01-08 DIAGNOSIS — E11649 Type 2 diabetes mellitus with hypoglycemia without coma: Secondary | ICD-10-CM | POA: Diagnosis not present

## 2022-01-08 DIAGNOSIS — I10 Essential (primary) hypertension: Secondary | ICD-10-CM | POA: Diagnosis not present

## 2022-01-08 DIAGNOSIS — E119 Type 2 diabetes mellitus without complications: Secondary | ICD-10-CM | POA: Diagnosis not present

## 2022-01-08 DIAGNOSIS — Z7985 Long-term (current) use of injectable non-insulin antidiabetic drugs: Secondary | ICD-10-CM | POA: Diagnosis not present

## 2022-01-08 DIAGNOSIS — G20 Parkinson's disease: Secondary | ICD-10-CM | POA: Diagnosis not present

## 2022-01-08 DIAGNOSIS — N281 Cyst of kidney, acquired: Secondary | ICD-10-CM | POA: Diagnosis not present

## 2022-01-08 DIAGNOSIS — K5900 Constipation, unspecified: Secondary | ICD-10-CM | POA: Diagnosis not present

## 2022-01-08 DIAGNOSIS — Z20822 Contact with and (suspected) exposure to covid-19: Secondary | ICD-10-CM | POA: Diagnosis not present

## 2022-01-08 DIAGNOSIS — H409 Unspecified glaucoma: Secondary | ICD-10-CM | POA: Diagnosis not present

## 2022-01-08 DIAGNOSIS — R0602 Shortness of breath: Secondary | ICD-10-CM | POA: Diagnosis not present

## 2022-01-08 DIAGNOSIS — M25551 Pain in right hip: Secondary | ICD-10-CM | POA: Diagnosis not present

## 2022-01-08 DIAGNOSIS — W19XXXA Unspecified fall, initial encounter: Secondary | ICD-10-CM | POA: Diagnosis not present

## 2022-01-08 DIAGNOSIS — Y999 Unspecified external cause status: Secondary | ICD-10-CM | POA: Diagnosis not present

## 2022-01-08 DIAGNOSIS — N179 Acute kidney failure, unspecified: Secondary | ICD-10-CM | POA: Diagnosis not present

## 2022-01-09 DIAGNOSIS — K760 Fatty (change of) liver, not elsewhere classified: Secondary | ICD-10-CM | POA: Diagnosis not present

## 2022-01-09 DIAGNOSIS — M549 Dorsalgia, unspecified: Secondary | ICD-10-CM | POA: Diagnosis not present

## 2022-01-09 DIAGNOSIS — I10 Essential (primary) hypertension: Secondary | ICD-10-CM | POA: Diagnosis not present

## 2022-01-09 DIAGNOSIS — N281 Cyst of kidney, acquired: Secondary | ICD-10-CM | POA: Diagnosis not present

## 2022-01-09 DIAGNOSIS — E11649 Type 2 diabetes mellitus with hypoglycemia without coma: Secondary | ICD-10-CM | POA: Diagnosis not present

## 2022-01-09 DIAGNOSIS — G20 Parkinson's disease: Secondary | ICD-10-CM | POA: Diagnosis not present

## 2022-01-09 DIAGNOSIS — N179 Acute kidney failure, unspecified: Secondary | ICD-10-CM | POA: Diagnosis not present

## 2022-01-09 DIAGNOSIS — Z7409 Other reduced mobility: Secondary | ICD-10-CM | POA: Diagnosis not present

## 2022-01-09 DIAGNOSIS — J9601 Acute respiratory failure with hypoxia: Secondary | ICD-10-CM | POA: Diagnosis not present

## 2022-01-09 DIAGNOSIS — J069 Acute upper respiratory infection, unspecified: Secondary | ICD-10-CM | POA: Diagnosis not present

## 2022-01-09 DIAGNOSIS — R0602 Shortness of breath: Secondary | ICD-10-CM | POA: Diagnosis not present

## 2022-01-10 DIAGNOSIS — N179 Acute kidney failure, unspecified: Secondary | ICD-10-CM | POA: Diagnosis not present

## 2022-01-10 DIAGNOSIS — Z7409 Other reduced mobility: Secondary | ICD-10-CM | POA: Diagnosis not present

## 2022-01-10 DIAGNOSIS — R0609 Other forms of dyspnea: Secondary | ICD-10-CM | POA: Diagnosis not present

## 2022-01-10 DIAGNOSIS — G20 Parkinson's disease: Secondary | ICD-10-CM | POA: Diagnosis not present

## 2022-01-10 DIAGNOSIS — M549 Dorsalgia, unspecified: Secondary | ICD-10-CM | POA: Diagnosis not present

## 2022-01-10 DIAGNOSIS — J069 Acute upper respiratory infection, unspecified: Secondary | ICD-10-CM | POA: Diagnosis not present

## 2022-01-10 DIAGNOSIS — I10 Essential (primary) hypertension: Secondary | ICD-10-CM | POA: Diagnosis not present

## 2022-01-10 DIAGNOSIS — K5900 Constipation, unspecified: Secondary | ICD-10-CM | POA: Diagnosis not present

## 2022-01-10 DIAGNOSIS — E119 Type 2 diabetes mellitus without complications: Secondary | ICD-10-CM | POA: Diagnosis not present

## 2022-01-10 DIAGNOSIS — J9601 Acute respiratory failure with hypoxia: Secondary | ICD-10-CM | POA: Diagnosis not present

## 2022-01-11 DIAGNOSIS — E119 Type 2 diabetes mellitus without complications: Secondary | ICD-10-CM | POA: Diagnosis not present

## 2022-01-11 DIAGNOSIS — G20 Parkinson's disease: Secondary | ICD-10-CM | POA: Diagnosis not present

## 2022-01-11 DIAGNOSIS — J069 Acute upper respiratory infection, unspecified: Secondary | ICD-10-CM | POA: Diagnosis not present

## 2022-01-11 DIAGNOSIS — Z789 Other specified health status: Secondary | ICD-10-CM | POA: Diagnosis not present

## 2022-01-11 DIAGNOSIS — Z7409 Other reduced mobility: Secondary | ICD-10-CM | POA: Diagnosis not present

## 2022-01-11 DIAGNOSIS — I10 Essential (primary) hypertension: Secondary | ICD-10-CM | POA: Diagnosis not present

## 2022-01-11 DIAGNOSIS — N179 Acute kidney failure, unspecified: Secondary | ICD-10-CM | POA: Diagnosis not present

## 2022-01-11 DIAGNOSIS — M549 Dorsalgia, unspecified: Secondary | ICD-10-CM | POA: Diagnosis not present

## 2022-01-11 DIAGNOSIS — J9601 Acute respiratory failure with hypoxia: Secondary | ICD-10-CM | POA: Diagnosis not present

## 2022-01-12 DIAGNOSIS — I10 Essential (primary) hypertension: Secondary | ICD-10-CM | POA: Diagnosis not present

## 2022-01-12 DIAGNOSIS — G20 Parkinson's disease: Secondary | ICD-10-CM | POA: Diagnosis not present

## 2022-01-12 DIAGNOSIS — J069 Acute upper respiratory infection, unspecified: Secondary | ICD-10-CM | POA: Diagnosis not present

## 2022-01-12 DIAGNOSIS — Z7409 Other reduced mobility: Secondary | ICD-10-CM | POA: Diagnosis not present

## 2022-01-12 DIAGNOSIS — N179 Acute kidney failure, unspecified: Secondary | ICD-10-CM | POA: Diagnosis not present

## 2022-01-12 DIAGNOSIS — M549 Dorsalgia, unspecified: Secondary | ICD-10-CM | POA: Diagnosis not present

## 2022-01-12 DIAGNOSIS — E119 Type 2 diabetes mellitus without complications: Secondary | ICD-10-CM | POA: Diagnosis not present

## 2022-01-12 DIAGNOSIS — J9601 Acute respiratory failure with hypoxia: Secondary | ICD-10-CM | POA: Diagnosis not present

## 2022-01-12 DIAGNOSIS — Z789 Other specified health status: Secondary | ICD-10-CM | POA: Diagnosis not present

## 2022-01-14 DIAGNOSIS — G20 Parkinson's disease: Secondary | ICD-10-CM | POA: Diagnosis not present

## 2022-01-14 DIAGNOSIS — J9601 Acute respiratory failure with hypoxia: Secondary | ICD-10-CM | POA: Diagnosis not present

## 2022-01-14 DIAGNOSIS — K219 Gastro-esophageal reflux disease without esophagitis: Secondary | ICD-10-CM | POA: Diagnosis not present

## 2022-01-14 DIAGNOSIS — H409 Unspecified glaucoma: Secondary | ICD-10-CM | POA: Diagnosis not present

## 2022-01-14 DIAGNOSIS — I10 Essential (primary) hypertension: Secondary | ICD-10-CM | POA: Diagnosis not present

## 2022-01-14 DIAGNOSIS — C61 Malignant neoplasm of prostate: Secondary | ICD-10-CM | POA: Insufficient documentation

## 2022-01-14 DIAGNOSIS — Z9181 History of falling: Secondary | ICD-10-CM | POA: Diagnosis not present

## 2022-01-14 DIAGNOSIS — R339 Retention of urine, unspecified: Secondary | ICD-10-CM | POA: Diagnosis not present

## 2022-01-14 DIAGNOSIS — N179 Acute kidney failure, unspecified: Secondary | ICD-10-CM | POA: Diagnosis not present

## 2022-01-14 DIAGNOSIS — Z789 Other specified health status: Secondary | ICD-10-CM | POA: Diagnosis not present

## 2022-01-14 DIAGNOSIS — Z743 Need for continuous supervision: Secondary | ICD-10-CM | POA: Diagnosis not present

## 2022-01-14 DIAGNOSIS — M549 Dorsalgia, unspecified: Secondary | ICD-10-CM | POA: Diagnosis not present

## 2022-01-14 DIAGNOSIS — J069 Acute upper respiratory infection, unspecified: Secondary | ICD-10-CM | POA: Diagnosis not present

## 2022-01-14 DIAGNOSIS — E559 Vitamin D deficiency, unspecified: Secondary | ICD-10-CM | POA: Diagnosis not present

## 2022-01-14 DIAGNOSIS — R338 Other retention of urine: Secondary | ICD-10-CM | POA: Diagnosis not present

## 2022-01-14 DIAGNOSIS — R1313 Dysphagia, pharyngeal phase: Secondary | ICD-10-CM | POA: Diagnosis not present

## 2022-01-14 DIAGNOSIS — Z466 Encounter for fitting and adjustment of urinary device: Secondary | ICD-10-CM | POA: Diagnosis not present

## 2022-01-14 DIAGNOSIS — R059 Cough, unspecified: Secondary | ICD-10-CM | POA: Diagnosis not present

## 2022-01-14 DIAGNOSIS — J449 Chronic obstructive pulmonary disease, unspecified: Secondary | ICD-10-CM | POA: Diagnosis not present

## 2022-01-14 DIAGNOSIS — E785 Hyperlipidemia, unspecified: Secondary | ICD-10-CM | POA: Diagnosis not present

## 2022-01-14 DIAGNOSIS — E119 Type 2 diabetes mellitus without complications: Secondary | ICD-10-CM | POA: Diagnosis not present

## 2022-01-14 DIAGNOSIS — Z7409 Other reduced mobility: Secondary | ICD-10-CM | POA: Diagnosis not present

## 2022-01-17 ENCOUNTER — Ambulatory Visit: Payer: Medicare Other | Admitting: Family Medicine

## 2022-01-25 DIAGNOSIS — R339 Retention of urine, unspecified: Secondary | ICD-10-CM | POA: Diagnosis not present

## 2022-02-01 DIAGNOSIS — E785 Hyperlipidemia, unspecified: Secondary | ICD-10-CM | POA: Diagnosis not present

## 2022-02-01 DIAGNOSIS — Z85118 Personal history of other malignant neoplasm of bronchus and lung: Secondary | ICD-10-CM | POA: Diagnosis not present

## 2022-02-01 DIAGNOSIS — Z87891 Personal history of nicotine dependence: Secondary | ICD-10-CM | POA: Diagnosis not present

## 2022-02-01 DIAGNOSIS — E1140 Type 2 diabetes mellitus with diabetic neuropathy, unspecified: Secondary | ICD-10-CM | POA: Diagnosis not present

## 2022-02-01 DIAGNOSIS — K219 Gastro-esophageal reflux disease without esophagitis: Secondary | ICD-10-CM | POA: Diagnosis not present

## 2022-02-01 DIAGNOSIS — R339 Retention of urine, unspecified: Secondary | ICD-10-CM | POA: Diagnosis not present

## 2022-02-01 DIAGNOSIS — G20 Parkinson's disease: Secondary | ICD-10-CM | POA: Diagnosis not present

## 2022-02-01 DIAGNOSIS — J449 Chronic obstructive pulmonary disease, unspecified: Secondary | ICD-10-CM | POA: Diagnosis not present

## 2022-02-01 DIAGNOSIS — E559 Vitamin D deficiency, unspecified: Secondary | ICD-10-CM | POA: Diagnosis not present

## 2022-02-01 DIAGNOSIS — N179 Acute kidney failure, unspecified: Secondary | ICD-10-CM | POA: Diagnosis not present

## 2022-02-01 DIAGNOSIS — H409 Unspecified glaucoma: Secondary | ICD-10-CM | POA: Diagnosis not present

## 2022-02-04 ENCOUNTER — Other Ambulatory Visit: Payer: Self-pay | Admitting: Family Medicine

## 2022-02-05 ENCOUNTER — Other Ambulatory Visit: Payer: Self-pay | Admitting: Family Medicine

## 2022-02-05 DIAGNOSIS — R339 Retention of urine, unspecified: Secondary | ICD-10-CM | POA: Diagnosis not present

## 2022-02-05 DIAGNOSIS — G4752 REM sleep behavior disorder: Secondary | ICD-10-CM | POA: Diagnosis not present

## 2022-02-05 DIAGNOSIS — G478 Other sleep disorders: Secondary | ICD-10-CM | POA: Diagnosis not present

## 2022-02-05 DIAGNOSIS — R2681 Unsteadiness on feet: Secondary | ICD-10-CM | POA: Diagnosis not present

## 2022-02-05 DIAGNOSIS — H409 Unspecified glaucoma: Secondary | ICD-10-CM | POA: Diagnosis not present

## 2022-02-05 DIAGNOSIS — J449 Chronic obstructive pulmonary disease, unspecified: Secondary | ICD-10-CM | POA: Diagnosis not present

## 2022-02-05 DIAGNOSIS — K5900 Constipation, unspecified: Secondary | ICD-10-CM | POA: Diagnosis not present

## 2022-02-05 DIAGNOSIS — Z9221 Personal history of antineoplastic chemotherapy: Secondary | ICD-10-CM | POA: Diagnosis not present

## 2022-02-05 DIAGNOSIS — Z79899 Other long term (current) drug therapy: Secondary | ICD-10-CM | POA: Diagnosis not present

## 2022-02-05 DIAGNOSIS — Z9181 History of falling: Secondary | ICD-10-CM | POA: Diagnosis not present

## 2022-02-05 DIAGNOSIS — G20 Parkinson's disease: Secondary | ICD-10-CM | POA: Diagnosis not present

## 2022-02-05 DIAGNOSIS — E785 Hyperlipidemia, unspecified: Secondary | ICD-10-CM | POA: Diagnosis not present

## 2022-02-05 DIAGNOSIS — Z902 Acquired absence of lung [part of]: Secondary | ICD-10-CM | POA: Diagnosis not present

## 2022-02-05 DIAGNOSIS — R2689 Other abnormalities of gait and mobility: Secondary | ICD-10-CM | POA: Diagnosis not present

## 2022-02-05 DIAGNOSIS — G3184 Mild cognitive impairment, so stated: Secondary | ICD-10-CM | POA: Diagnosis not present

## 2022-02-05 DIAGNOSIS — E559 Vitamin D deficiency, unspecified: Secondary | ICD-10-CM | POA: Diagnosis not present

## 2022-02-05 DIAGNOSIS — G232 Striatonigral degeneration: Secondary | ICD-10-CM | POA: Diagnosis not present

## 2022-02-05 DIAGNOSIS — K219 Gastro-esophageal reflux disease without esophagitis: Secondary | ICD-10-CM | POA: Diagnosis not present

## 2022-02-05 DIAGNOSIS — N398 Other specified disorders of urinary system: Secondary | ICD-10-CM | POA: Diagnosis not present

## 2022-02-05 DIAGNOSIS — R479 Unspecified speech disturbances: Secondary | ICD-10-CM | POA: Diagnosis not present

## 2022-02-05 DIAGNOSIS — E1140 Type 2 diabetes mellitus with diabetic neuropathy, unspecified: Secondary | ICD-10-CM | POA: Diagnosis not present

## 2022-02-05 DIAGNOSIS — F32A Depression, unspecified: Secondary | ICD-10-CM | POA: Diagnosis not present

## 2022-02-05 DIAGNOSIS — Z85118 Personal history of other malignant neoplasm of bronchus and lung: Secondary | ICD-10-CM | POA: Diagnosis not present

## 2022-02-05 DIAGNOSIS — Z794 Long term (current) use of insulin: Secondary | ICD-10-CM | POA: Diagnosis not present

## 2022-02-05 DIAGNOSIS — N179 Acute kidney failure, unspecified: Secondary | ICD-10-CM | POA: Diagnosis not present

## 2022-02-05 DIAGNOSIS — Z87891 Personal history of nicotine dependence: Secondary | ICD-10-CM | POA: Diagnosis not present

## 2022-02-05 DIAGNOSIS — Z9889 Other specified postprocedural states: Secondary | ICD-10-CM | POA: Diagnosis not present

## 2022-02-06 ENCOUNTER — Telehealth: Payer: Self-pay

## 2022-02-06 DIAGNOSIS — N179 Acute kidney failure, unspecified: Secondary | ICD-10-CM | POA: Diagnosis not present

## 2022-02-06 DIAGNOSIS — E559 Vitamin D deficiency, unspecified: Secondary | ICD-10-CM | POA: Diagnosis not present

## 2022-02-06 DIAGNOSIS — R339 Retention of urine, unspecified: Secondary | ICD-10-CM | POA: Diagnosis not present

## 2022-02-06 DIAGNOSIS — Z87891 Personal history of nicotine dependence: Secondary | ICD-10-CM | POA: Diagnosis not present

## 2022-02-06 DIAGNOSIS — E785 Hyperlipidemia, unspecified: Secondary | ICD-10-CM | POA: Diagnosis not present

## 2022-02-06 DIAGNOSIS — G20 Parkinson's disease: Secondary | ICD-10-CM | POA: Diagnosis not present

## 2022-02-06 DIAGNOSIS — E1140 Type 2 diabetes mellitus with diabetic neuropathy, unspecified: Secondary | ICD-10-CM | POA: Diagnosis not present

## 2022-02-06 DIAGNOSIS — H409 Unspecified glaucoma: Secondary | ICD-10-CM | POA: Diagnosis not present

## 2022-02-06 DIAGNOSIS — K219 Gastro-esophageal reflux disease without esophagitis: Secondary | ICD-10-CM | POA: Diagnosis not present

## 2022-02-06 DIAGNOSIS — J449 Chronic obstructive pulmonary disease, unspecified: Secondary | ICD-10-CM | POA: Diagnosis not present

## 2022-02-06 DIAGNOSIS — Z85118 Personal history of other malignant neoplasm of bronchus and lung: Secondary | ICD-10-CM | POA: Diagnosis not present

## 2022-02-06 NOTE — Telephone Encounter (Signed)
Rcvd vm from Gray @ [facility] @ [phone].  Requesting VO's for ambulation and balance: 2 x 2wk 1 x 2wk Skip a week 1 x 2 wk  VO's granted. Documentation being faxed for Dr. Zigmund Daniel consideration.

## 2022-02-07 ENCOUNTER — Telehealth: Payer: Self-pay | Admitting: Family Medicine

## 2022-02-07 DIAGNOSIS — Z87891 Personal history of nicotine dependence: Secondary | ICD-10-CM | POA: Diagnosis not present

## 2022-02-07 DIAGNOSIS — Z85118 Personal history of other malignant neoplasm of bronchus and lung: Secondary | ICD-10-CM | POA: Diagnosis not present

## 2022-02-07 DIAGNOSIS — H409 Unspecified glaucoma: Secondary | ICD-10-CM | POA: Diagnosis not present

## 2022-02-07 DIAGNOSIS — R339 Retention of urine, unspecified: Secondary | ICD-10-CM | POA: Diagnosis not present

## 2022-02-07 DIAGNOSIS — E559 Vitamin D deficiency, unspecified: Secondary | ICD-10-CM | POA: Diagnosis not present

## 2022-02-07 DIAGNOSIS — E785 Hyperlipidemia, unspecified: Secondary | ICD-10-CM | POA: Diagnosis not present

## 2022-02-07 DIAGNOSIS — G20 Parkinson's disease: Secondary | ICD-10-CM | POA: Diagnosis not present

## 2022-02-07 DIAGNOSIS — N179 Acute kidney failure, unspecified: Secondary | ICD-10-CM | POA: Diagnosis not present

## 2022-02-07 DIAGNOSIS — K219 Gastro-esophageal reflux disease without esophagitis: Secondary | ICD-10-CM | POA: Diagnosis not present

## 2022-02-07 DIAGNOSIS — E1140 Type 2 diabetes mellitus with diabetic neuropathy, unspecified: Secondary | ICD-10-CM | POA: Diagnosis not present

## 2022-02-07 DIAGNOSIS — J449 Chronic obstructive pulmonary disease, unspecified: Secondary | ICD-10-CM | POA: Diagnosis not present

## 2022-02-07 NOTE — Telephone Encounter (Signed)
Lea from [facility] [phone] called and said she would like an order for a speech evaluation for swallowing and possible aspiration.   She also wanted to report that the patient fell out of the bed this morning and there are no injuries to report.

## 2022-02-07 NOTE — Telephone Encounter (Signed)
Returned calls to [employer]. VO's granted for speech evaluation by Dr. Zigmund Daniel.

## 2022-02-11 ENCOUNTER — Other Ambulatory Visit: Payer: Self-pay | Admitting: Family Medicine

## 2022-02-11 DIAGNOSIS — E782 Mixed hyperlipidemia: Secondary | ICD-10-CM

## 2022-02-12 DIAGNOSIS — Z87891 Personal history of nicotine dependence: Secondary | ICD-10-CM | POA: Diagnosis not present

## 2022-02-12 DIAGNOSIS — K219 Gastro-esophageal reflux disease without esophagitis: Secondary | ICD-10-CM | POA: Diagnosis not present

## 2022-02-12 DIAGNOSIS — N179 Acute kidney failure, unspecified: Secondary | ICD-10-CM | POA: Diagnosis not present

## 2022-02-12 DIAGNOSIS — R339 Retention of urine, unspecified: Secondary | ICD-10-CM | POA: Diagnosis not present

## 2022-02-12 DIAGNOSIS — G20 Parkinson's disease: Secondary | ICD-10-CM | POA: Diagnosis not present

## 2022-02-12 DIAGNOSIS — H409 Unspecified glaucoma: Secondary | ICD-10-CM | POA: Diagnosis not present

## 2022-02-12 DIAGNOSIS — E559 Vitamin D deficiency, unspecified: Secondary | ICD-10-CM | POA: Diagnosis not present

## 2022-02-12 DIAGNOSIS — J449 Chronic obstructive pulmonary disease, unspecified: Secondary | ICD-10-CM | POA: Diagnosis not present

## 2022-02-12 DIAGNOSIS — E785 Hyperlipidemia, unspecified: Secondary | ICD-10-CM | POA: Diagnosis not present

## 2022-02-12 DIAGNOSIS — Z85118 Personal history of other malignant neoplasm of bronchus and lung: Secondary | ICD-10-CM | POA: Diagnosis not present

## 2022-02-12 DIAGNOSIS — E1140 Type 2 diabetes mellitus with diabetic neuropathy, unspecified: Secondary | ICD-10-CM | POA: Diagnosis not present

## 2022-02-13 DIAGNOSIS — G20 Parkinson's disease: Secondary | ICD-10-CM | POA: Diagnosis not present

## 2022-02-13 DIAGNOSIS — H409 Unspecified glaucoma: Secondary | ICD-10-CM | POA: Diagnosis not present

## 2022-02-13 DIAGNOSIS — N179 Acute kidney failure, unspecified: Secondary | ICD-10-CM | POA: Diagnosis not present

## 2022-02-13 DIAGNOSIS — J449 Chronic obstructive pulmonary disease, unspecified: Secondary | ICD-10-CM | POA: Diagnosis not present

## 2022-02-13 DIAGNOSIS — E559 Vitamin D deficiency, unspecified: Secondary | ICD-10-CM | POA: Diagnosis not present

## 2022-02-13 DIAGNOSIS — Z87891 Personal history of nicotine dependence: Secondary | ICD-10-CM | POA: Diagnosis not present

## 2022-02-13 DIAGNOSIS — R339 Retention of urine, unspecified: Secondary | ICD-10-CM | POA: Diagnosis not present

## 2022-02-13 DIAGNOSIS — E785 Hyperlipidemia, unspecified: Secondary | ICD-10-CM | POA: Diagnosis not present

## 2022-02-13 DIAGNOSIS — Z85118 Personal history of other malignant neoplasm of bronchus and lung: Secondary | ICD-10-CM | POA: Diagnosis not present

## 2022-02-13 DIAGNOSIS — K219 Gastro-esophageal reflux disease without esophagitis: Secondary | ICD-10-CM | POA: Diagnosis not present

## 2022-02-13 DIAGNOSIS — E1140 Type 2 diabetes mellitus with diabetic neuropathy, unspecified: Secondary | ICD-10-CM | POA: Diagnosis not present

## 2022-02-14 ENCOUNTER — Telehealth: Payer: Self-pay

## 2022-02-14 NOTE — Telephone Encounter (Signed)
VM received from [facility] concerning patient discharge.   [facility] received call from the insurance stating his insurance had been terminated. They have canceled all upcoming appointments and he has been discharged from the Agency.

## 2022-02-18 DIAGNOSIS — N1832 Chronic kidney disease, stage 3b: Secondary | ICD-10-CM | POA: Diagnosis not present

## 2022-02-19 ENCOUNTER — Telehealth: Payer: Self-pay | Admitting: Family Medicine

## 2022-02-19 DIAGNOSIS — G232 Striatonigral degeneration: Secondary | ICD-10-CM

## 2022-02-19 DIAGNOSIS — E1142 Type 2 diabetes mellitus with diabetic polyneuropathy: Secondary | ICD-10-CM

## 2022-02-19 DIAGNOSIS — R269 Unspecified abnormalities of gait and mobility: Secondary | ICD-10-CM

## 2022-02-19 DIAGNOSIS — G20 Parkinson's disease: Secondary | ICD-10-CM

## 2022-02-19 NOTE — Telephone Encounter (Signed)
Pt called and said that he has new insurance. Which is just straight Medicare. His chart has been updated with this info. He is requesting his services to be started again - which is PT, OT, Speech and a Nurse visits.

## 2022-02-20 DIAGNOSIS — R339 Retention of urine, unspecified: Secondary | ICD-10-CM | POA: Diagnosis not present

## 2022-02-20 DIAGNOSIS — N400 Enlarged prostate without lower urinary tract symptoms: Secondary | ICD-10-CM | POA: Diagnosis not present

## 2022-02-20 DIAGNOSIS — R972 Elevated prostate specific antigen [PSA]: Secondary | ICD-10-CM | POA: Diagnosis not present

## 2022-02-20 DIAGNOSIS — N281 Cyst of kidney, acquired: Secondary | ICD-10-CM | POA: Diagnosis not present

## 2022-02-20 DIAGNOSIS — C61 Malignant neoplasm of prostate: Secondary | ICD-10-CM | POA: Diagnosis not present

## 2022-02-21 ENCOUNTER — Encounter: Payer: Self-pay | Admitting: Family Medicine

## 2022-02-22 ENCOUNTER — Telehealth: Payer: Self-pay | Admitting: Family Medicine

## 2022-02-22 NOTE — Telephone Encounter (Signed)
Tina with [facility] called. They need a new order(pt has new insurance)for PT & OT faxed to [phone].  Thank you.

## 2022-02-25 DIAGNOSIS — H02831 Dermatochalasis of right upper eyelid: Secondary | ICD-10-CM | POA: Diagnosis not present

## 2022-02-25 DIAGNOSIS — H02834 Dermatochalasis of left upper eyelid: Secondary | ICD-10-CM | POA: Diagnosis not present

## 2022-02-25 DIAGNOSIS — H527 Unspecified disorder of refraction: Secondary | ICD-10-CM | POA: Diagnosis not present

## 2022-02-25 DIAGNOSIS — E119 Type 2 diabetes mellitus without complications: Secondary | ICD-10-CM | POA: Diagnosis not present

## 2022-02-25 DIAGNOSIS — Z961 Presence of intraocular lens: Secondary | ICD-10-CM | POA: Diagnosis not present

## 2022-02-25 DIAGNOSIS — H43813 Vitreous degeneration, bilateral: Secondary | ICD-10-CM | POA: Diagnosis not present

## 2022-02-25 DIAGNOSIS — H40003 Preglaucoma, unspecified, bilateral: Secondary | ICD-10-CM | POA: Diagnosis not present

## 2022-02-26 DIAGNOSIS — Z87891 Personal history of nicotine dependence: Secondary | ICD-10-CM | POA: Diagnosis not present

## 2022-02-26 DIAGNOSIS — G232 Striatonigral degeneration: Secondary | ICD-10-CM | POA: Diagnosis not present

## 2022-02-26 DIAGNOSIS — F419 Anxiety disorder, unspecified: Secondary | ICD-10-CM | POA: Diagnosis not present

## 2022-02-26 DIAGNOSIS — E1151 Type 2 diabetes mellitus with diabetic peripheral angiopathy without gangrene: Secondary | ICD-10-CM | POA: Diagnosis not present

## 2022-02-26 DIAGNOSIS — N529 Male erectile dysfunction, unspecified: Secondary | ICD-10-CM | POA: Diagnosis not present

## 2022-02-26 DIAGNOSIS — Z85118 Personal history of other malignant neoplasm of bronchus and lung: Secondary | ICD-10-CM | POA: Diagnosis not present

## 2022-02-26 DIAGNOSIS — Z7985 Long-term (current) use of injectable non-insulin antidiabetic drugs: Secondary | ICD-10-CM | POA: Diagnosis not present

## 2022-02-26 DIAGNOSIS — N189 Chronic kidney disease, unspecified: Secondary | ICD-10-CM | POA: Diagnosis not present

## 2022-02-26 DIAGNOSIS — R3915 Urgency of urination: Secondary | ICD-10-CM | POA: Diagnosis not present

## 2022-02-26 DIAGNOSIS — Z9049 Acquired absence of other specified parts of digestive tract: Secondary | ICD-10-CM | POA: Diagnosis not present

## 2022-02-26 DIAGNOSIS — Z902 Acquired absence of lung [part of]: Secondary | ICD-10-CM | POA: Diagnosis not present

## 2022-02-26 DIAGNOSIS — N401 Enlarged prostate with lower urinary tract symptoms: Secondary | ICD-10-CM | POA: Diagnosis not present

## 2022-02-26 DIAGNOSIS — Z794 Long term (current) use of insulin: Secondary | ICD-10-CM | POA: Diagnosis not present

## 2022-02-26 DIAGNOSIS — I872 Venous insufficiency (chronic) (peripheral): Secondary | ICD-10-CM | POA: Diagnosis not present

## 2022-02-26 DIAGNOSIS — I129 Hypertensive chronic kidney disease with stage 1 through stage 4 chronic kidney disease, or unspecified chronic kidney disease: Secondary | ICD-10-CM | POA: Diagnosis not present

## 2022-02-26 DIAGNOSIS — Z9181 History of falling: Secondary | ICD-10-CM | POA: Diagnosis not present

## 2022-02-26 DIAGNOSIS — G4733 Obstructive sleep apnea (adult) (pediatric): Secondary | ICD-10-CM | POA: Diagnosis not present

## 2022-02-26 DIAGNOSIS — G20 Parkinson's disease: Secondary | ICD-10-CM | POA: Diagnosis not present

## 2022-02-26 DIAGNOSIS — E1122 Type 2 diabetes mellitus with diabetic chronic kidney disease: Secondary | ICD-10-CM | POA: Diagnosis not present

## 2022-02-26 DIAGNOSIS — K219 Gastro-esophageal reflux disease without esophagitis: Secondary | ICD-10-CM | POA: Diagnosis not present

## 2022-02-26 DIAGNOSIS — E1142 Type 2 diabetes mellitus with diabetic polyneuropathy: Secondary | ICD-10-CM | POA: Diagnosis not present

## 2022-02-26 DIAGNOSIS — E785 Hyperlipidemia, unspecified: Secondary | ICD-10-CM | POA: Diagnosis not present

## 2022-02-26 DIAGNOSIS — Z7952 Long term (current) use of systemic steroids: Secondary | ICD-10-CM | POA: Diagnosis not present

## 2022-02-26 DIAGNOSIS — F320 Major depressive disorder, single episode, mild: Secondary | ICD-10-CM | POA: Diagnosis not present

## 2022-02-26 DIAGNOSIS — M47816 Spondylosis without myelopathy or radiculopathy, lumbar region: Secondary | ICD-10-CM | POA: Diagnosis not present

## 2022-02-27 NOTE — Telephone Encounter (Signed)
Received vm from [place] (PT) [facility] requesting VO's.  1 visit this weeks 1 x 8 weeks OT evaluation & ST evaluation.   Cleared per Dr. Zigmund Daniel.

## 2022-02-28 DIAGNOSIS — G232 Striatonigral degeneration: Secondary | ICD-10-CM | POA: Diagnosis not present

## 2022-02-28 DIAGNOSIS — E1142 Type 2 diabetes mellitus with diabetic polyneuropathy: Secondary | ICD-10-CM | POA: Diagnosis not present

## 2022-02-28 DIAGNOSIS — I129 Hypertensive chronic kidney disease with stage 1 through stage 4 chronic kidney disease, or unspecified chronic kidney disease: Secondary | ICD-10-CM | POA: Diagnosis not present

## 2022-02-28 DIAGNOSIS — E1122 Type 2 diabetes mellitus with diabetic chronic kidney disease: Secondary | ICD-10-CM | POA: Diagnosis not present

## 2022-02-28 DIAGNOSIS — N189 Chronic kidney disease, unspecified: Secondary | ICD-10-CM | POA: Diagnosis not present

## 2022-02-28 DIAGNOSIS — G20 Parkinson's disease: Secondary | ICD-10-CM | POA: Diagnosis not present

## 2022-03-05 DIAGNOSIS — I129 Hypertensive chronic kidney disease with stage 1 through stage 4 chronic kidney disease, or unspecified chronic kidney disease: Secondary | ICD-10-CM | POA: Diagnosis not present

## 2022-03-05 DIAGNOSIS — G232 Striatonigral degeneration: Secondary | ICD-10-CM | POA: Diagnosis not present

## 2022-03-05 DIAGNOSIS — N189 Chronic kidney disease, unspecified: Secondary | ICD-10-CM | POA: Diagnosis not present

## 2022-03-05 DIAGNOSIS — E1122 Type 2 diabetes mellitus with diabetic chronic kidney disease: Secondary | ICD-10-CM | POA: Diagnosis not present

## 2022-03-05 DIAGNOSIS — G20 Parkinson's disease: Secondary | ICD-10-CM | POA: Diagnosis not present

## 2022-03-05 DIAGNOSIS — E1142 Type 2 diabetes mellitus with diabetic polyneuropathy: Secondary | ICD-10-CM | POA: Diagnosis not present

## 2022-03-07 ENCOUNTER — Telehealth: Payer: Self-pay

## 2022-03-07 NOTE — Telephone Encounter (Signed)
Agree with VO.  Thanks!  CM

## 2022-03-07 NOTE — Telephone Encounter (Signed)
Rcvd vm from [facility] @ [facility] [phone]) requesting 4 visits for speech therapy.  VO's granted.

## 2022-03-12 DIAGNOSIS — N189 Chronic kidney disease, unspecified: Secondary | ICD-10-CM | POA: Diagnosis not present

## 2022-03-12 DIAGNOSIS — E1142 Type 2 diabetes mellitus with diabetic polyneuropathy: Secondary | ICD-10-CM | POA: Diagnosis not present

## 2022-03-12 DIAGNOSIS — G20 Parkinson's disease: Secondary | ICD-10-CM | POA: Diagnosis not present

## 2022-03-12 DIAGNOSIS — E1122 Type 2 diabetes mellitus with diabetic chronic kidney disease: Secondary | ICD-10-CM | POA: Diagnosis not present

## 2022-03-12 DIAGNOSIS — I129 Hypertensive chronic kidney disease with stage 1 through stage 4 chronic kidney disease, or unspecified chronic kidney disease: Secondary | ICD-10-CM | POA: Diagnosis not present

## 2022-03-12 DIAGNOSIS — G232 Striatonigral degeneration: Secondary | ICD-10-CM | POA: Diagnosis not present

## 2022-03-13 ENCOUNTER — Ambulatory Visit (INDEPENDENT_AMBULATORY_CARE_PROVIDER_SITE_OTHER): Payer: Medicare Other | Admitting: Family Medicine

## 2022-03-13 ENCOUNTER — Encounter: Payer: Self-pay | Admitting: Family Medicine

## 2022-03-13 ENCOUNTER — Ambulatory Visit (INDEPENDENT_AMBULATORY_CARE_PROVIDER_SITE_OTHER): Payer: Medicare Other

## 2022-03-13 VITALS — BP 123/50 | HR 95 | Resp 18 | Ht 69.0 in | Wt 245.0 lb

## 2022-03-13 DIAGNOSIS — G20 Parkinson's disease: Secondary | ICD-10-CM | POA: Diagnosis not present

## 2022-03-13 DIAGNOSIS — R0602 Shortness of breath: Secondary | ICD-10-CM

## 2022-03-13 DIAGNOSIS — M7989 Other specified soft tissue disorders: Secondary | ICD-10-CM

## 2022-03-13 DIAGNOSIS — R059 Cough, unspecified: Secondary | ICD-10-CM | POA: Diagnosis not present

## 2022-03-13 DIAGNOSIS — R051 Acute cough: Secondary | ICD-10-CM

## 2022-03-13 NOTE — Progress Notes (Signed)
Acute Office Visit  Subjective:     Patient ID: Randall Fisher, male    DOB: [birth year], 66 y.o.   MRN: [id number]  Chief Complaint  Patient presents with   Leg Swelling    Patient complains of swelling/pain in legs for 1 week.    Cough    Patient states he has a productive cough for 1 wee. Patient denies any other symptoms. Patient states he had PT a few days ago and was told that he had fluid on his lungs.     HPI Patient is in today for Patient states he has a productive cough for 1 week. Patient denies any other symptoms. Patient states he had PT a few days ago and was told that he had fluid on his lungs.  Mild nasal congestion.  No fevers, chills, sweats.  No rashes.  Home physical therapist noted abnormal lung sounds yesterday and recommended that he be seen. Hx of parkinsons disease.  Sputum is a brown color. Not a smoker.  Reports he does feel mildly short of breath.  Patient complains of swelling/pain in legs for 1 week around the same time.  He reports he is up about 8 pounds from his baseline.  He says he had similar symptoms with increased cough shortness of breath and lower extremity swelling when he was diagnosed with pneumonia and hospitalized and another time when he was diagnosed with a kidney infection.  Currently has prostate cancer that they are monitoring.  He had pneumonia in October.  Found echocardiogram from September 2020 done at [facility].  Unable to see results in care everywhere.  ROS      Objective:    BP (!) 123/50   Pulse 95   Resp 18   Ht 5\' 9"  (1.753 m)   Wt 245 lb (111.1 kg) Comment: pt. reported  SpO2 99%   BMI 36.18 kg/m    Physical Exam Constitutional:      Appearance: He is well-developed.  HENT:     Head: Normocephalic and atraumatic.     Right Ear: External ear normal.     Left Ear: External ear normal.     Nose: Nose normal.  Eyes:     Conjunctiva/sclera: Conjunctivae normal.     Pupils: Pupils are equal, round, and  reactive to light.  Neck:     Thyroid: No thyromegaly.  Cardiovascular:     Rate and Rhythm: Normal rate.     Heart sounds: Normal heart sounds.  Pulmonary:     Effort: Pulmonary effort is normal.     Breath sounds: Normal breath sounds.  Musculoskeletal:     Cervical back: Neck supple.     Comments: 1+ pitting edema of the lower extremities mostly medial to the tibia.  Lymphadenopathy:     Cervical: No cervical adenopathy.  Skin:    General: Skin is warm and dry.  Neurological:     Mental Status: He is alert and oriented to person, place, and time.     No results found for any visits on 03/13/22.      Assessment & Plan:   Problem List Items Addressed This Visit       Nervous and Auditory   Parkinsonism ([place]) - Primary   Relevant Orders   COMPLETE METABOLIC PANEL WITH GFR   Urinalysis, Routine w reflex microscopic   CBC with Differential/Platelet   B Nat Peptide   Other Visit Diagnoses     Acute cough  Relevant Orders   DG Chest 2 View   COMPLETE METABOLIC PANEL WITH GFR   Urinalysis, Routine w reflex microscopic   CBC with Differential/Platelet   B Nat Peptide   SOB (shortness of breath)       Relevant Orders   DG Chest 2 View   COMPLETE METABOLIC PANEL WITH GFR   Urinalysis, Routine w reflex microscopic   CBC with Differential/Platelet   B Nat Peptide   Leg swelling       Relevant Orders   DG Chest 2 View   COMPLETE METABOLIC PANEL WITH GFR   Urinalysis, Routine w reflex microscopic   CBC with Differential/Platelet   B Nat Peptide      Cough/shortness of breath-possible pneumonia versus bronchitis.  Lung exam is actually pretty reassuring today.  Pulse ox is normal.  He is afebrile.  We will go ahead and get chest x-ray since he also onset of lower extremity edema right around the same time.  Consider alternative diagnoses such as heart failure we will check a BNP.  Lower extremity edema-we will check a BNP.  He just had a normal TSH about 2  months ago.  We will check for any change in baseline renal or liver function and will call with results once available.  In the short-term increase furosemide to 40 mg daily.  Limit fluid intake to 50 ounces daily until he is down the 8 pounds that he has gained over the last week.  No orders of the defined types were placed in this encounter.  I spent 40 minutes on the day of the encounter to include pre-visit record review, face-to-face time with the patient and post visit ordering of test.   Return if symptoms worsen or fail to improve.  Beatrice Lecher, MD

## 2022-03-13 NOTE — Patient Instructions (Signed)
Increase furosemide to 2 tabs daily and limit fluids to no more than 50 ounces until you are down the 8 lb. Then you can go back to one tab daily and liberalize fluid intake.

## 2022-03-14 ENCOUNTER — Encounter: Payer: Self-pay | Admitting: Family Medicine

## 2022-03-14 DIAGNOSIS — E1122 Type 2 diabetes mellitus with diabetic chronic kidney disease: Secondary | ICD-10-CM | POA: Diagnosis not present

## 2022-03-14 DIAGNOSIS — R918 Other nonspecific abnormal finding of lung field: Secondary | ICD-10-CM

## 2022-03-14 DIAGNOSIS — E1142 Type 2 diabetes mellitus with diabetic polyneuropathy: Secondary | ICD-10-CM | POA: Diagnosis not present

## 2022-03-14 DIAGNOSIS — I129 Hypertensive chronic kidney disease with stage 1 through stage 4 chronic kidney disease, or unspecified chronic kidney disease: Secondary | ICD-10-CM | POA: Diagnosis not present

## 2022-03-14 DIAGNOSIS — R9389 Abnormal findings on diagnostic imaging of other specified body structures: Secondary | ICD-10-CM

## 2022-03-14 DIAGNOSIS — G232 Striatonigral degeneration: Secondary | ICD-10-CM | POA: Diagnosis not present

## 2022-03-14 DIAGNOSIS — G20 Parkinson's disease: Secondary | ICD-10-CM | POA: Diagnosis not present

## 2022-03-14 DIAGNOSIS — N189 Chronic kidney disease, unspecified: Secondary | ICD-10-CM | POA: Diagnosis not present

## 2022-03-14 LAB — COMPLETE METABOLIC PANEL WITH GFR
AG Ratio: 1.8 (calc) (ref 1.0–2.5)
ALT: 20 U/L (ref 9–46)
AST: 17 U/L (ref 10–35)
Albumin: 4.2 g/dL (ref 3.6–5.1)
Alkaline phosphatase (APISO): 41 U/L (ref 35–144)
BUN: 15 mg/dL (ref 7–25)
CO2: 29 mmol/L (ref 20–32)
Calcium: 9.6 mg/dL (ref 8.6–10.3)
Chloride: 106 mmol/L (ref 98–110)
Creat: 1.35 mg/dL (ref 0.70–1.35)
Globulin: 2.4 g/dL (calc) (ref 1.9–3.7)
Glucose, Bld: 207 mg/dL — ABNORMAL HIGH (ref 65–99)
Potassium: 4.3 mmol/L (ref 3.5–5.3)
Sodium: 142 mmol/L (ref 135–146)
Total Bilirubin: 0.4 mg/dL (ref 0.2–1.2)
Total Protein: 6.6 g/dL (ref 6.1–8.1)
eGFR: 58 mL/min/{1.73_m2} — ABNORMAL LOW (ref >=60)

## 2022-03-14 LAB — CBC WITH DIFFERENTIAL/PLATELET
Absolute Monocytes: 546 cells/uL (ref 200–950)
Basophils Absolute: 61 cells/uL (ref 0–200)
Basophils Relative: 1.2 %
Eosinophils Absolute: 168 cells/uL (ref 15–500)
Eosinophils Relative: 3.3 %
HCT: 32.8 % — ABNORMAL LOW (ref 38.5–50.0)
Hemoglobin: 10 g/dL — ABNORMAL LOW (ref 13.2–17.1)
Lymphs Abs: 1520 cells/uL (ref 850–3900)
MCH: 24.1 pg — ABNORMAL LOW (ref 27.0–33.0)
MCHC: 30.5 g/dL — ABNORMAL LOW (ref 32.0–36.0)
MCV: 79 fL — ABNORMAL LOW (ref 80.0–100.0)
MPV: 10.4 fL (ref 7.5–12.5)
Monocytes Relative: 10.7 %
Neutro Abs: 2805 cells/uL (ref 1500–7800)
Neutrophils Relative %: 55 %
Platelets: 202 10*3/uL (ref 140–400)
RBC: 4.15 10*6/uL — ABNORMAL LOW (ref 4.20–5.80)
RDW: 15.6 % — ABNORMAL HIGH (ref 11.0–15.0)
Total Lymphocyte: 29.8 %
WBC: 5.1 10*3/uL (ref 3.8–10.8)

## 2022-03-14 LAB — URINALYSIS, ROUTINE W REFLEX MICROSCOPIC
Bilirubin Urine: NEGATIVE
Hgb urine dipstick: NEGATIVE
Ketones, ur: NEGATIVE
Leukocytes,Ua: NEGATIVE
Nitrite: NEGATIVE
Protein, ur: NEGATIVE
Specific Gravity, Urine: 1.02 (ref 1.001–1.035)
pH: 5.5 (ref 5.0–8.0)

## 2022-03-14 LAB — BRAIN NATRIURETIC PEPTIDE: Brain Natriuretic Peptide: 36 pg/mL (ref <100)

## 2022-03-14 NOTE — Progress Notes (Signed)
Hi Neal,  Your kidney function looks better this time which is fantastic!  It was 1.4 about 7 months ago it is down to 1.3 which is awesome.  Liver function is normal.  No sign of infection in the urine.  No sign of excess protein.  You are still anemic similar to about 7 months ago your hemoglobin is right around 10.  Still awaiting 1 more test result.

## 2022-03-14 NOTE — Progress Notes (Signed)
In addition to the notes below no sign of heart failure based on the blood work.  Please see note regarding chest x-ray results.

## 2022-03-14 NOTE — Progress Notes (Signed)
HI Randall Fisher,  The chest x-ray showed no sign of pneumonia which is reassuring I think some of your symptoms are probably related to the increased fluid and volume that you are holding.  Hopefully increasing your diuretic will help with that over the next couple of days.  They did see though a questionable area near the middle of your chest that looked a little bit larger than it did in 2021.  So they have recommended a chest CT for further work-up.  If you are okay with me ordering that I am happy to do so and get that in and get you scheduled.

## 2022-03-18 ENCOUNTER — Ambulatory Visit (INDEPENDENT_AMBULATORY_CARE_PROVIDER_SITE_OTHER): Payer: Medicare Other

## 2022-03-18 DIAGNOSIS — E1122 Type 2 diabetes mellitus with diabetic chronic kidney disease: Secondary | ICD-10-CM | POA: Diagnosis not present

## 2022-03-18 DIAGNOSIS — N1831 Chronic kidney disease, stage 3a: Secondary | ICD-10-CM | POA: Diagnosis not present

## 2022-03-18 DIAGNOSIS — R9389 Abnormal findings on diagnostic imaging of other specified body structures: Secondary | ICD-10-CM | POA: Diagnosis not present

## 2022-03-18 DIAGNOSIS — J984 Other disorders of lung: Secondary | ICD-10-CM | POA: Diagnosis not present

## 2022-03-18 DIAGNOSIS — I318 Other specified diseases of pericardium: Secondary | ICD-10-CM | POA: Diagnosis not present

## 2022-03-18 DIAGNOSIS — Z85118 Personal history of other malignant neoplasm of bronchus and lung: Secondary | ICD-10-CM | POA: Diagnosis not present

## 2022-03-18 DIAGNOSIS — E1142 Type 2 diabetes mellitus with diabetic polyneuropathy: Secondary | ICD-10-CM | POA: Diagnosis not present

## 2022-03-18 DIAGNOSIS — K449 Diaphragmatic hernia without obstruction or gangrene: Secondary | ICD-10-CM | POA: Diagnosis not present

## 2022-03-18 DIAGNOSIS — Z794 Long term (current) use of insulin: Secondary | ICD-10-CM | POA: Diagnosis not present

## 2022-03-18 MED ORDER — IOHEXOL 300 MG/ML  SOLN
100.0000 mL | Freq: Once | INTRAMUSCULAR | Status: AC | PRN
  Administered 2022-03-18: 75 mL via INTRAVENOUS

## 2022-03-20 ENCOUNTER — Encounter: Payer: Self-pay | Admitting: Family Medicine

## 2022-03-20 DIAGNOSIS — K760 Fatty (change of) liver, not elsewhere classified: Secondary | ICD-10-CM | POA: Insufficient documentation

## 2022-03-20 MED ORDER — AZITHROMYCIN 250 MG PO TABS: ORAL_TABLET | ORAL | 0 refills | Status: AC

## 2022-03-20 MED ORDER — PREDNISONE 20 MG PO TABS: 40.0000 mg | ORAL_TABLET | Freq: Every day | ORAL | 0 refills | Status: DC

## 2022-03-20 NOTE — Progress Notes (Signed)
Hi Neal, good news is the questionable area on the chest x-ray did not show anything worrisome on the CAT scan so no mass which is wonderful.  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  There was some fluid inside the esophagus which often indicates reflux but not necessarily.  But that is possible.  There is some areas of scarring in the lungs.  As well as some patchy areas consistent with bronchiolitis, which can come from an acute infection..  They did see a few pulmonary nodules that were not there before they are very small under 3 mm but they did recommend a repeat CT in 1 year because of history of lung cancer.  There is also some plaque formation in the aortic artery and coronary arteries.  Also noted is a fatty liver.  Are you feeling any better in regards to your cough?  If no then let me know and I Minna send over an antibiotic.  If you are feeling a lot better then this may have just been viral.  Please just let me know.

## 2022-03-20 NOTE — Progress Notes (Signed)
Orders Placed This Encounter     iohexol (OMNIPAQUE) 300 MG/ML solution 100 mL     azithromycin (ZITHROMAX) 250 MG tablet         Sig: 2 Ttabs PO on Day 1, then one a day x 4 days.         Dispense:  6 tablet         Refill:  0     predniSONE (DELTASONE) 20 MG tablet         Sig: Take 2 tablets (40 mg total) by mouth daily with breakfast.         Dispense:  10 tablet         Refill:  0

## 2022-03-21 DIAGNOSIS — H40002 Preglaucoma, unspecified, left eye: Secondary | ICD-10-CM | POA: Diagnosis not present

## 2022-03-22 DIAGNOSIS — C3492 Malignant neoplasm of unspecified part of left bronchus or lung: Secondary | ICD-10-CM | POA: Diagnosis not present

## 2022-03-25 DIAGNOSIS — E1142 Type 2 diabetes mellitus with diabetic polyneuropathy: Secondary | ICD-10-CM | POA: Diagnosis not present

## 2022-03-25 DIAGNOSIS — G232 Striatonigral degeneration: Secondary | ICD-10-CM | POA: Diagnosis not present

## 2022-03-25 DIAGNOSIS — G20 Parkinson's disease: Secondary | ICD-10-CM | POA: Diagnosis not present

## 2022-03-25 DIAGNOSIS — E1122 Type 2 diabetes mellitus with diabetic chronic kidney disease: Secondary | ICD-10-CM | POA: Diagnosis not present

## 2022-03-25 DIAGNOSIS — I129 Hypertensive chronic kidney disease with stage 1 through stage 4 chronic kidney disease, or unspecified chronic kidney disease: Secondary | ICD-10-CM | POA: Diagnosis not present

## 2022-03-25 DIAGNOSIS — N189 Chronic kidney disease, unspecified: Secondary | ICD-10-CM | POA: Diagnosis not present

## 2022-03-28 DIAGNOSIS — N529 Male erectile dysfunction, unspecified: Secondary | ICD-10-CM | POA: Diagnosis not present

## 2022-03-28 DIAGNOSIS — G20 Parkinson's disease: Secondary | ICD-10-CM | POA: Diagnosis not present

## 2022-03-28 DIAGNOSIS — N401 Enlarged prostate with lower urinary tract symptoms: Secondary | ICD-10-CM | POA: Diagnosis not present

## 2022-03-28 DIAGNOSIS — R3915 Urgency of urination: Secondary | ICD-10-CM | POA: Diagnosis not present

## 2022-03-28 DIAGNOSIS — Z902 Acquired absence of lung [part of]: Secondary | ICD-10-CM | POA: Diagnosis not present

## 2022-03-28 DIAGNOSIS — E1142 Type 2 diabetes mellitus with diabetic polyneuropathy: Secondary | ICD-10-CM | POA: Diagnosis not present

## 2022-03-28 DIAGNOSIS — F419 Anxiety disorder, unspecified: Secondary | ICD-10-CM | POA: Diagnosis not present

## 2022-03-28 DIAGNOSIS — E1122 Type 2 diabetes mellitus with diabetic chronic kidney disease: Secondary | ICD-10-CM | POA: Diagnosis not present

## 2022-03-28 DIAGNOSIS — E785 Hyperlipidemia, unspecified: Secondary | ICD-10-CM | POA: Diagnosis not present

## 2022-03-28 DIAGNOSIS — Z7952 Long term (current) use of systemic steroids: Secondary | ICD-10-CM | POA: Diagnosis not present

## 2022-03-28 DIAGNOSIS — K219 Gastro-esophageal reflux disease without esophagitis: Secondary | ICD-10-CM | POA: Diagnosis not present

## 2022-03-28 DIAGNOSIS — G4733 Obstructive sleep apnea (adult) (pediatric): Secondary | ICD-10-CM | POA: Diagnosis not present

## 2022-03-28 DIAGNOSIS — G232 Striatonigral degeneration: Secondary | ICD-10-CM | POA: Diagnosis not present

## 2022-03-28 DIAGNOSIS — Z9049 Acquired absence of other specified parts of digestive tract: Secondary | ICD-10-CM | POA: Diagnosis not present

## 2022-03-28 DIAGNOSIS — Z9181 History of falling: Secondary | ICD-10-CM | POA: Diagnosis not present

## 2022-03-28 DIAGNOSIS — F320 Major depressive disorder, single episode, mild: Secondary | ICD-10-CM | POA: Diagnosis not present

## 2022-03-28 DIAGNOSIS — Z87891 Personal history of nicotine dependence: Secondary | ICD-10-CM | POA: Diagnosis not present

## 2022-03-28 DIAGNOSIS — I129 Hypertensive chronic kidney disease with stage 1 through stage 4 chronic kidney disease, or unspecified chronic kidney disease: Secondary | ICD-10-CM | POA: Diagnosis not present

## 2022-03-28 DIAGNOSIS — Z85118 Personal history of other malignant neoplasm of bronchus and lung: Secondary | ICD-10-CM | POA: Diagnosis not present

## 2022-03-28 DIAGNOSIS — I872 Venous insufficiency (chronic) (peripheral): Secondary | ICD-10-CM | POA: Diagnosis not present

## 2022-03-28 DIAGNOSIS — Z794 Long term (current) use of insulin: Secondary | ICD-10-CM | POA: Diagnosis not present

## 2022-03-28 DIAGNOSIS — N189 Chronic kidney disease, unspecified: Secondary | ICD-10-CM | POA: Diagnosis not present

## 2022-03-28 DIAGNOSIS — E1151 Type 2 diabetes mellitus with diabetic peripheral angiopathy without gangrene: Secondary | ICD-10-CM | POA: Diagnosis not present

## 2022-03-28 DIAGNOSIS — M47816 Spondylosis without myelopathy or radiculopathy, lumbar region: Secondary | ICD-10-CM | POA: Diagnosis not present

## 2022-03-28 DIAGNOSIS — Z7985 Long-term (current) use of injectable non-insulin antidiabetic drugs: Secondary | ICD-10-CM | POA: Diagnosis not present

## 2022-04-01 ENCOUNTER — Other Ambulatory Visit: Payer: Self-pay | Admitting: Family Medicine

## 2022-04-01 DIAGNOSIS — H401113 Primary open-angle glaucoma, right eye, severe stage: Secondary | ICD-10-CM | POA: Diagnosis not present

## 2022-04-01 DIAGNOSIS — H40002 Preglaucoma, unspecified, left eye: Secondary | ICD-10-CM | POA: Diagnosis not present

## 2022-04-02 DIAGNOSIS — N189 Chronic kidney disease, unspecified: Secondary | ICD-10-CM | POA: Diagnosis not present

## 2022-04-02 DIAGNOSIS — G232 Striatonigral degeneration: Secondary | ICD-10-CM | POA: Diagnosis not present

## 2022-04-02 DIAGNOSIS — E1122 Type 2 diabetes mellitus with diabetic chronic kidney disease: Secondary | ICD-10-CM | POA: Diagnosis not present

## 2022-04-02 DIAGNOSIS — E1142 Type 2 diabetes mellitus with diabetic polyneuropathy: Secondary | ICD-10-CM | POA: Diagnosis not present

## 2022-04-02 DIAGNOSIS — G20 Parkinson's disease: Secondary | ICD-10-CM | POA: Diagnosis not present

## 2022-04-02 DIAGNOSIS — I129 Hypertensive chronic kidney disease with stage 1 through stage 4 chronic kidney disease, or unspecified chronic kidney disease: Secondary | ICD-10-CM | POA: Diagnosis not present

## 2022-04-03 DIAGNOSIS — N189 Chronic kidney disease, unspecified: Secondary | ICD-10-CM | POA: Diagnosis not present

## 2022-04-03 DIAGNOSIS — E1142 Type 2 diabetes mellitus with diabetic polyneuropathy: Secondary | ICD-10-CM | POA: Diagnosis not present

## 2022-04-03 DIAGNOSIS — G20 Parkinson's disease: Secondary | ICD-10-CM | POA: Diagnosis not present

## 2022-04-03 DIAGNOSIS — E1122 Type 2 diabetes mellitus with diabetic chronic kidney disease: Secondary | ICD-10-CM | POA: Diagnosis not present

## 2022-04-03 DIAGNOSIS — I129 Hypertensive chronic kidney disease with stage 1 through stage 4 chronic kidney disease, or unspecified chronic kidney disease: Secondary | ICD-10-CM | POA: Diagnosis not present

## 2022-04-03 DIAGNOSIS — G232 Striatonigral degeneration: Secondary | ICD-10-CM | POA: Diagnosis not present

## 2022-04-04 DIAGNOSIS — G20 Parkinson's disease: Secondary | ICD-10-CM | POA: Diagnosis not present

## 2022-04-04 DIAGNOSIS — G232 Striatonigral degeneration: Secondary | ICD-10-CM | POA: Diagnosis not present

## 2022-04-04 DIAGNOSIS — E1122 Type 2 diabetes mellitus with diabetic chronic kidney disease: Secondary | ICD-10-CM | POA: Diagnosis not present

## 2022-04-04 DIAGNOSIS — I129 Hypertensive chronic kidney disease with stage 1 through stage 4 chronic kidney disease, or unspecified chronic kidney disease: Secondary | ICD-10-CM | POA: Diagnosis not present

## 2022-04-04 DIAGNOSIS — N189 Chronic kidney disease, unspecified: Secondary | ICD-10-CM | POA: Diagnosis not present

## 2022-04-04 DIAGNOSIS — E1142 Type 2 diabetes mellitus with diabetic polyneuropathy: Secondary | ICD-10-CM | POA: Diagnosis not present

## 2022-04-09 DIAGNOSIS — E1122 Type 2 diabetes mellitus with diabetic chronic kidney disease: Secondary | ICD-10-CM | POA: Diagnosis not present

## 2022-04-09 DIAGNOSIS — E1142 Type 2 diabetes mellitus with diabetic polyneuropathy: Secondary | ICD-10-CM | POA: Diagnosis not present

## 2022-04-09 DIAGNOSIS — I129 Hypertensive chronic kidney disease with stage 1 through stage 4 chronic kidney disease, or unspecified chronic kidney disease: Secondary | ICD-10-CM | POA: Diagnosis not present

## 2022-04-09 DIAGNOSIS — N189 Chronic kidney disease, unspecified: Secondary | ICD-10-CM | POA: Diagnosis not present

## 2022-04-09 DIAGNOSIS — G232 Striatonigral degeneration: Secondary | ICD-10-CM | POA: Diagnosis not present

## 2022-04-09 DIAGNOSIS — G20 Parkinson's disease: Secondary | ICD-10-CM | POA: Diagnosis not present

## 2022-04-10 ENCOUNTER — Other Ambulatory Visit: Payer: Self-pay | Admitting: Family Medicine

## 2022-04-10 MED ORDER — CLOTRIMAZOLE-BETAMETHASONE 1-0.05 % EX CREA: 1.0000 | TOPICAL_CREAM | Freq: Every day | CUTANEOUS | 3 refills | Status: DC

## 2022-04-11 DIAGNOSIS — Z7409 Other reduced mobility: Secondary | ICD-10-CM | POA: Diagnosis not present

## 2022-04-11 DIAGNOSIS — Z9181 History of falling: Secondary | ICD-10-CM | POA: Diagnosis not present

## 2022-04-11 DIAGNOSIS — R2681 Unsteadiness on feet: Secondary | ICD-10-CM | POA: Diagnosis not present

## 2022-04-11 DIAGNOSIS — R2689 Other abnormalities of gait and mobility: Secondary | ICD-10-CM | POA: Diagnosis not present

## 2022-04-12 DIAGNOSIS — E1122 Type 2 diabetes mellitus with diabetic chronic kidney disease: Secondary | ICD-10-CM | POA: Diagnosis not present

## 2022-04-12 DIAGNOSIS — G20 Parkinson's disease: Secondary | ICD-10-CM | POA: Diagnosis not present

## 2022-04-12 DIAGNOSIS — E1142 Type 2 diabetes mellitus with diabetic polyneuropathy: Secondary | ICD-10-CM | POA: Diagnosis not present

## 2022-04-12 DIAGNOSIS — G232 Striatonigral degeneration: Secondary | ICD-10-CM | POA: Diagnosis not present

## 2022-04-12 DIAGNOSIS — N189 Chronic kidney disease, unspecified: Secondary | ICD-10-CM | POA: Diagnosis not present

## 2022-04-12 DIAGNOSIS — I129 Hypertensive chronic kidney disease with stage 1 through stage 4 chronic kidney disease, or unspecified chronic kidney disease: Secondary | ICD-10-CM | POA: Diagnosis not present

## 2022-04-18 DIAGNOSIS — N189 Chronic kidney disease, unspecified: Secondary | ICD-10-CM | POA: Diagnosis not present

## 2022-04-18 DIAGNOSIS — E1122 Type 2 diabetes mellitus with diabetic chronic kidney disease: Secondary | ICD-10-CM | POA: Diagnosis not present

## 2022-04-18 DIAGNOSIS — E1142 Type 2 diabetes mellitus with diabetic polyneuropathy: Secondary | ICD-10-CM | POA: Diagnosis not present

## 2022-04-18 DIAGNOSIS — G232 Striatonigral degeneration: Secondary | ICD-10-CM | POA: Diagnosis not present

## 2022-04-18 DIAGNOSIS — I129 Hypertensive chronic kidney disease with stage 1 through stage 4 chronic kidney disease, or unspecified chronic kidney disease: Secondary | ICD-10-CM | POA: Diagnosis not present

## 2022-04-18 DIAGNOSIS — G20 Parkinson's disease: Secondary | ICD-10-CM | POA: Diagnosis not present

## 2022-04-19 ENCOUNTER — Telehealth: Payer: Self-pay

## 2022-04-19 NOTE — Telephone Encounter (Signed)
Annett Gula of [facility] lvm stating pt Randall Fisher fell on 04/18/2022 prior to her 2pm visit. She found the patient on the floor facedown. He was NOT using his walker. They were able to pick him up with his own assistance. All vitals signs were normal. No increase in pain above his usual level 4.

## 2022-04-22 NOTE — Telephone Encounter (Signed)
Per Malachy Mood, no change in mental status and no injury.

## 2022-04-24 ENCOUNTER — Telehealth: Payer: Self-pay

## 2022-04-24 DIAGNOSIS — E1142 Type 2 diabetes mellitus with diabetic polyneuropathy: Secondary | ICD-10-CM | POA: Diagnosis not present

## 2022-04-24 DIAGNOSIS — G20 Parkinson's disease: Secondary | ICD-10-CM | POA: Diagnosis not present

## 2022-04-24 DIAGNOSIS — E1122 Type 2 diabetes mellitus with diabetic chronic kidney disease: Secondary | ICD-10-CM | POA: Diagnosis not present

## 2022-04-24 DIAGNOSIS — G232 Striatonigral degeneration: Secondary | ICD-10-CM | POA: Diagnosis not present

## 2022-04-24 DIAGNOSIS — I129 Hypertensive chronic kidney disease with stage 1 through stage 4 chronic kidney disease, or unspecified chronic kidney disease: Secondary | ICD-10-CM | POA: Diagnosis not present

## 2022-04-24 DIAGNOSIS — N189 Chronic kidney disease, unspecified: Secondary | ICD-10-CM | POA: Diagnosis not present

## 2022-04-24 NOTE — Telephone Encounter (Signed)
Rcvd vm from [facility] @ [facility] concerning Speech Therapy.  Requesting continuation of speech and swallow therapy x 8 weeks.   VO granted. Documentation being faxed to Dr. Zigmund Daniel.

## 2022-04-24 NOTE — Telephone Encounter (Signed)
Received VM from [facility] [phone]  Pt has been discharged from PT due to lack of progress. Their opinion is that his Parkinsons has advanced and impeded progress. They have no continuation justification.   Dr. Zigmund Daniel is aware.

## 2022-04-25 DIAGNOSIS — E1122 Type 2 diabetes mellitus with diabetic chronic kidney disease: Secondary | ICD-10-CM | POA: Diagnosis not present

## 2022-04-25 DIAGNOSIS — I129 Hypertensive chronic kidney disease with stage 1 through stage 4 chronic kidney disease, or unspecified chronic kidney disease: Secondary | ICD-10-CM | POA: Diagnosis not present

## 2022-04-25 DIAGNOSIS — E1142 Type 2 diabetes mellitus with diabetic polyneuropathy: Secondary | ICD-10-CM | POA: Diagnosis not present

## 2022-04-25 DIAGNOSIS — N189 Chronic kidney disease, unspecified: Secondary | ICD-10-CM | POA: Diagnosis not present

## 2022-04-25 DIAGNOSIS — G20 Parkinson's disease: Secondary | ICD-10-CM | POA: Diagnosis not present

## 2022-04-25 DIAGNOSIS — G232 Striatonigral degeneration: Secondary | ICD-10-CM | POA: Diagnosis not present

## 2022-04-27 DIAGNOSIS — N189 Chronic kidney disease, unspecified: Secondary | ICD-10-CM | POA: Diagnosis not present

## 2022-04-27 DIAGNOSIS — I129 Hypertensive chronic kidney disease with stage 1 through stage 4 chronic kidney disease, or unspecified chronic kidney disease: Secondary | ICD-10-CM | POA: Diagnosis not present

## 2022-04-27 DIAGNOSIS — M47816 Spondylosis without myelopathy or radiculopathy, lumbar region: Secondary | ICD-10-CM | POA: Diagnosis not present

## 2022-04-27 DIAGNOSIS — R1312 Dysphagia, oropharyngeal phase: Secondary | ICD-10-CM | POA: Diagnosis not present

## 2022-04-27 DIAGNOSIS — Z902 Acquired absence of lung [part of]: Secondary | ICD-10-CM | POA: Diagnosis not present

## 2022-04-27 DIAGNOSIS — Z9049 Acquired absence of other specified parts of digestive tract: Secondary | ICD-10-CM | POA: Diagnosis not present

## 2022-04-27 DIAGNOSIS — Z85118 Personal history of other malignant neoplasm of bronchus and lung: Secondary | ICD-10-CM | POA: Diagnosis not present

## 2022-04-27 DIAGNOSIS — Z794 Long term (current) use of insulin: Secondary | ICD-10-CM | POA: Diagnosis not present

## 2022-04-27 DIAGNOSIS — N401 Enlarged prostate with lower urinary tract symptoms: Secondary | ICD-10-CM | POA: Diagnosis not present

## 2022-04-27 DIAGNOSIS — E1142 Type 2 diabetes mellitus with diabetic polyneuropathy: Secondary | ICD-10-CM | POA: Diagnosis not present

## 2022-04-27 DIAGNOSIS — R3915 Urgency of urination: Secondary | ICD-10-CM | POA: Diagnosis not present

## 2022-04-27 DIAGNOSIS — E1122 Type 2 diabetes mellitus with diabetic chronic kidney disease: Secondary | ICD-10-CM | POA: Diagnosis not present

## 2022-04-27 DIAGNOSIS — Z7985 Long-term (current) use of injectable non-insulin antidiabetic drugs: Secondary | ICD-10-CM | POA: Diagnosis not present

## 2022-04-27 DIAGNOSIS — N529 Male erectile dysfunction, unspecified: Secondary | ICD-10-CM | POA: Diagnosis not present

## 2022-04-27 DIAGNOSIS — Z7952 Long term (current) use of systemic steroids: Secondary | ICD-10-CM | POA: Diagnosis not present

## 2022-04-27 DIAGNOSIS — G4733 Obstructive sleep apnea (adult) (pediatric): Secondary | ICD-10-CM | POA: Diagnosis not present

## 2022-04-27 DIAGNOSIS — I872 Venous insufficiency (chronic) (peripheral): Secondary | ICD-10-CM | POA: Diagnosis not present

## 2022-04-27 DIAGNOSIS — F320 Major depressive disorder, single episode, mild: Secondary | ICD-10-CM | POA: Diagnosis not present

## 2022-04-27 DIAGNOSIS — E1151 Type 2 diabetes mellitus with diabetic peripheral angiopathy without gangrene: Secondary | ICD-10-CM | POA: Diagnosis not present

## 2022-04-27 DIAGNOSIS — F419 Anxiety disorder, unspecified: Secondary | ICD-10-CM | POA: Diagnosis not present

## 2022-04-27 DIAGNOSIS — E785 Hyperlipidemia, unspecified: Secondary | ICD-10-CM | POA: Diagnosis not present

## 2022-04-27 DIAGNOSIS — K219 Gastro-esophageal reflux disease without esophagitis: Secondary | ICD-10-CM | POA: Diagnosis not present

## 2022-04-27 DIAGNOSIS — G20 Parkinson's disease: Secondary | ICD-10-CM | POA: Diagnosis not present

## 2022-04-27 DIAGNOSIS — G232 Striatonigral degeneration: Secondary | ICD-10-CM | POA: Diagnosis not present

## 2022-04-27 DIAGNOSIS — R482 Apraxia: Secondary | ICD-10-CM | POA: Diagnosis not present

## 2022-05-02 DIAGNOSIS — R482 Apraxia: Secondary | ICD-10-CM | POA: Diagnosis not present

## 2022-05-02 DIAGNOSIS — G232 Striatonigral degeneration: Secondary | ICD-10-CM | POA: Diagnosis not present

## 2022-05-02 DIAGNOSIS — R1312 Dysphagia, oropharyngeal phase: Secondary | ICD-10-CM | POA: Diagnosis not present

## 2022-05-02 DIAGNOSIS — E1142 Type 2 diabetes mellitus with diabetic polyneuropathy: Secondary | ICD-10-CM | POA: Diagnosis not present

## 2022-05-02 DIAGNOSIS — I129 Hypertensive chronic kidney disease with stage 1 through stage 4 chronic kidney disease, or unspecified chronic kidney disease: Secondary | ICD-10-CM | POA: Diagnosis not present

## 2022-05-02 DIAGNOSIS — G20 Parkinson's disease: Secondary | ICD-10-CM | POA: Diagnosis not present

## 2022-05-08 ENCOUNTER — Encounter: Payer: Self-pay | Admitting: General Practice

## 2022-05-09 DIAGNOSIS — G20 Parkinson's disease: Secondary | ICD-10-CM | POA: Diagnosis not present

## 2022-05-09 DIAGNOSIS — I129 Hypertensive chronic kidney disease with stage 1 through stage 4 chronic kidney disease, or unspecified chronic kidney disease: Secondary | ICD-10-CM | POA: Diagnosis not present

## 2022-05-09 DIAGNOSIS — G232 Striatonigral degeneration: Secondary | ICD-10-CM | POA: Diagnosis not present

## 2022-05-09 DIAGNOSIS — E1142 Type 2 diabetes mellitus with diabetic polyneuropathy: Secondary | ICD-10-CM | POA: Diagnosis not present

## 2022-05-09 DIAGNOSIS — R1312 Dysphagia, oropharyngeal phase: Secondary | ICD-10-CM | POA: Diagnosis not present

## 2022-05-09 DIAGNOSIS — R482 Apraxia: Secondary | ICD-10-CM | POA: Diagnosis not present

## 2022-05-16 DIAGNOSIS — N1339 Other hydronephrosis: Secondary | ICD-10-CM | POA: Diagnosis not present

## 2022-05-16 DIAGNOSIS — E1142 Type 2 diabetes mellitus with diabetic polyneuropathy: Secondary | ICD-10-CM | POA: Diagnosis not present

## 2022-05-16 DIAGNOSIS — R339 Retention of urine, unspecified: Secondary | ICD-10-CM | POA: Diagnosis not present

## 2022-05-16 DIAGNOSIS — G20 Parkinson's disease: Secondary | ICD-10-CM | POA: Diagnosis not present

## 2022-05-16 DIAGNOSIS — R1312 Dysphagia, oropharyngeal phase: Secondary | ICD-10-CM | POA: Diagnosis not present

## 2022-05-16 DIAGNOSIS — G232 Striatonigral degeneration: Secondary | ICD-10-CM | POA: Diagnosis not present

## 2022-05-16 DIAGNOSIS — N3941 Urge incontinence: Secondary | ICD-10-CM | POA: Diagnosis not present

## 2022-05-16 DIAGNOSIS — C61 Malignant neoplasm of prostate: Secondary | ICD-10-CM | POA: Diagnosis not present

## 2022-05-16 DIAGNOSIS — R482 Apraxia: Secondary | ICD-10-CM | POA: Diagnosis not present

## 2022-05-16 DIAGNOSIS — I129 Hypertensive chronic kidney disease with stage 1 through stage 4 chronic kidney disease, or unspecified chronic kidney disease: Secondary | ICD-10-CM | POA: Diagnosis not present

## 2022-05-24 DIAGNOSIS — H401113 Primary open-angle glaucoma, right eye, severe stage: Secondary | ICD-10-CM | POA: Diagnosis not present

## 2022-05-27 DIAGNOSIS — E1122 Type 2 diabetes mellitus with diabetic chronic kidney disease: Secondary | ICD-10-CM | POA: Diagnosis not present

## 2022-05-27 DIAGNOSIS — N529 Male erectile dysfunction, unspecified: Secondary | ICD-10-CM | POA: Diagnosis not present

## 2022-05-27 DIAGNOSIS — R3915 Urgency of urination: Secondary | ICD-10-CM | POA: Diagnosis not present

## 2022-05-27 DIAGNOSIS — G20 Parkinson's disease: Secondary | ICD-10-CM | POA: Diagnosis not present

## 2022-05-27 DIAGNOSIS — K219 Gastro-esophageal reflux disease without esophagitis: Secondary | ICD-10-CM | POA: Diagnosis not present

## 2022-05-27 DIAGNOSIS — G232 Striatonigral degeneration: Secondary | ICD-10-CM | POA: Diagnosis not present

## 2022-05-27 DIAGNOSIS — N401 Enlarged prostate with lower urinary tract symptoms: Secondary | ICD-10-CM | POA: Diagnosis not present

## 2022-05-27 DIAGNOSIS — R482 Apraxia: Secondary | ICD-10-CM | POA: Diagnosis not present

## 2022-05-27 DIAGNOSIS — Z9049 Acquired absence of other specified parts of digestive tract: Secondary | ICD-10-CM | POA: Diagnosis not present

## 2022-05-27 DIAGNOSIS — E1151 Type 2 diabetes mellitus with diabetic peripheral angiopathy without gangrene: Secondary | ICD-10-CM | POA: Diagnosis not present

## 2022-05-27 DIAGNOSIS — G4733 Obstructive sleep apnea (adult) (pediatric): Secondary | ICD-10-CM | POA: Diagnosis not present

## 2022-05-27 DIAGNOSIS — I129 Hypertensive chronic kidney disease with stage 1 through stage 4 chronic kidney disease, or unspecified chronic kidney disease: Secondary | ICD-10-CM | POA: Diagnosis not present

## 2022-05-27 DIAGNOSIS — Z85118 Personal history of other malignant neoplasm of bronchus and lung: Secondary | ICD-10-CM | POA: Diagnosis not present

## 2022-05-27 DIAGNOSIS — F320 Major depressive disorder, single episode, mild: Secondary | ICD-10-CM | POA: Diagnosis not present

## 2022-05-27 DIAGNOSIS — E1142 Type 2 diabetes mellitus with diabetic polyneuropathy: Secondary | ICD-10-CM | POA: Diagnosis not present

## 2022-05-27 DIAGNOSIS — Z902 Acquired absence of lung [part of]: Secondary | ICD-10-CM | POA: Diagnosis not present

## 2022-05-27 DIAGNOSIS — R1312 Dysphagia, oropharyngeal phase: Secondary | ICD-10-CM | POA: Diagnosis not present

## 2022-05-27 DIAGNOSIS — N189 Chronic kidney disease, unspecified: Secondary | ICD-10-CM | POA: Diagnosis not present

## 2022-05-27 DIAGNOSIS — E785 Hyperlipidemia, unspecified: Secondary | ICD-10-CM | POA: Diagnosis not present

## 2022-05-27 DIAGNOSIS — F419 Anxiety disorder, unspecified: Secondary | ICD-10-CM | POA: Diagnosis not present

## 2022-05-27 DIAGNOSIS — Z7985 Long-term (current) use of injectable non-insulin antidiabetic drugs: Secondary | ICD-10-CM | POA: Diagnosis not present

## 2022-05-27 DIAGNOSIS — Z7952 Long term (current) use of systemic steroids: Secondary | ICD-10-CM | POA: Diagnosis not present

## 2022-05-27 DIAGNOSIS — Z794 Long term (current) use of insulin: Secondary | ICD-10-CM | POA: Diagnosis not present

## 2022-05-27 DIAGNOSIS — I872 Venous insufficiency (chronic) (peripheral): Secondary | ICD-10-CM | POA: Diagnosis not present

## 2022-05-27 DIAGNOSIS — M47816 Spondylosis without myelopathy or radiculopathy, lumbar region: Secondary | ICD-10-CM | POA: Diagnosis not present

## 2022-05-28 ENCOUNTER — Other Ambulatory Visit: Payer: Self-pay | Admitting: Family Medicine

## 2022-05-30 DIAGNOSIS — R1312 Dysphagia, oropharyngeal phase: Secondary | ICD-10-CM | POA: Diagnosis not present

## 2022-05-30 DIAGNOSIS — G20 Parkinson's disease: Secondary | ICD-10-CM | POA: Diagnosis not present

## 2022-05-30 DIAGNOSIS — R482 Apraxia: Secondary | ICD-10-CM | POA: Diagnosis not present

## 2022-05-30 DIAGNOSIS — G232 Striatonigral degeneration: Secondary | ICD-10-CM | POA: Diagnosis not present

## 2022-05-30 DIAGNOSIS — E1142 Type 2 diabetes mellitus with diabetic polyneuropathy: Secondary | ICD-10-CM | POA: Diagnosis not present

## 2022-05-30 DIAGNOSIS — I129 Hypertensive chronic kidney disease with stage 1 through stage 4 chronic kidney disease, or unspecified chronic kidney disease: Secondary | ICD-10-CM | POA: Diagnosis not present

## 2022-05-31 DIAGNOSIS — N281 Cyst of kidney, acquired: Secondary | ICD-10-CM | POA: Diagnosis not present

## 2022-05-31 DIAGNOSIS — C61 Malignant neoplasm of prostate: Secondary | ICD-10-CM | POA: Diagnosis not present

## 2022-05-31 DIAGNOSIS — R339 Retention of urine, unspecified: Secondary | ICD-10-CM | POA: Diagnosis not present

## 2022-05-31 DIAGNOSIS — N1339 Other hydronephrosis: Secondary | ICD-10-CM | POA: Diagnosis not present

## 2022-06-05 ENCOUNTER — Telehealth (INDEPENDENT_AMBULATORY_CARE_PROVIDER_SITE_OTHER): Payer: Medicare Other | Admitting: Family Medicine

## 2022-06-05 ENCOUNTER — Encounter: Payer: Self-pay | Admitting: Family Medicine

## 2022-06-05 VITALS — Ht 69.0 in | Wt 245.0 lb

## 2022-06-05 DIAGNOSIS — J329 Chronic sinusitis, unspecified: Secondary | ICD-10-CM | POA: Diagnosis not present

## 2022-06-05 DIAGNOSIS — R059 Cough, unspecified: Secondary | ICD-10-CM | POA: Diagnosis not present

## 2022-06-05 DIAGNOSIS — J40 Bronchitis, not specified as acute or chronic: Secondary | ICD-10-CM | POA: Diagnosis not present

## 2022-06-05 MED ORDER — PREDNISONE 50 MG PO TABS: ORAL_TABLET | ORAL | 0 refills | Status: DC

## 2022-06-05 MED ORDER — BD PEN NEEDLE NANO U/F 32G X 4 MM MISC: 2 refills | Status: DC

## 2022-06-05 MED ORDER — FENOFIBRATE 160 MG PO TABS
160.0000 mg | ORAL_TABLET | Freq: Every day | ORAL | 1 refills | Status: DC
Start: 2022-06-05 — End: 2023-07-04

## 2022-06-05 MED ORDER — AMOXICILLIN-POT CLAVULANATE 875-125 MG PO TABS: 1.0000 | ORAL_TABLET | Freq: Two times a day (BID) | ORAL | 0 refills | Status: DC

## 2022-06-05 NOTE — Progress Notes (Signed)
Randall Fisher - 66 y.o. male MRN [id number]  Date of birth: 1956/06/25   This visit type was conducted due to national recommendations for restrictions regarding the COVID-19 Pandemic (e.g. social distancing).  This format is felt to be most appropriate for this patient at this time.  All issues noted in this document were discussed and addressed.  No physical exam was performed (except for noted visual exam findings with Video Visits).  I discussed the limitations of evaluation and management by telemedicine and the availability of in person appointments. The patient expressed understanding and agreed to proceed.  I connected withNAME@ on 06/05/22 at 11:30 AM EDT by a video enabled telemedicine application and verified that I am speaking with the correct person using two identifiers.  Present at visit: Randall Nutting, DO [name]   Patient Location: [address] [place] [address]   Provider location:   [place]  No chief complaint on file.   HPI  ASEEL UHDE is a 66 y.o. male who presents via audio/video conferencing for a telehealth visit today.  Reports increased chest congestion, cough, postnasal drainage, shortness of breath and wheezing.  Notices rattle in his upper chest.  He has not noticed any worsening after eating or drinking.  He has not had any fever, chills, nausea, vomiting or diarrhea.  COVID testing at home has been negative.   ROS:  A comprehensive ROS was completed and negative except as noted per HPI  Past Medical History:  Diagnosis Date   Back pain    Diabetes mellitus without complication (HCC)    Hyperlipidemia    Joint pain    Kidney disease    Leg swelling    Lung cancer (HCC)    Memory loss    Neuropathy    Numbness and tingling in both hands    S/P lobectomy of lung 2013   RUL for NSCLC    Past Surgical History:  Procedure Laterality Date   CARPAL TUNNEL RELEASE Bilateral    LUNG LOBECTOMY Right 2013   TONSILLECTOMY       Family History  Problem Relation Age of Onset   Diabetes Mother    Kidney failure Father     Social History   Socioeconomic History   Marital status: Married    Spouse name: [name]   Number of children: 1   Years of education: some college   Highest education level: Not on file  Occupational History   Occupation: unemployed - laid off   Occupation: seeking disability  Tobacco Use   Smoking status: Former   Smokeless tobacco: Never  Substance and Sexual Activity   Alcohol use: Not Currently   Drug use: Never   Sexual activity: Yes    Partners: Female    Birth control/protection: Condom  Other Topics Concern   Not on file  Social History Narrative   Lives at home with his wife.   1-2 cups caffeine per day.   Right-handed.      Social Determinants of Health   Financial Resource Strain: Not on file  Food Insecurity: Not on file  Transportation Needs: Not on file  Physical Activity: Not on file  Stress: Not on file  Social Connections: Not on file  Intimate Partner Violence: Not on file     Current Outpatient Medications:    albuterol (PROVENTIL) (2.5 MG/3ML) 0.083% nebulizer solution, Take 3 mLs (2.5 mg total) by nebulization every 6 (six) hours as needed for wheezing or shortness of  breath., Disp: 150 mL, Rfl: 1   AMBULATORY NON FORMULARY MEDICATION, Please provide lightweight transport chair.  Patient unable to propel himself in standard manual wheelchair.  Wife has trouble pushing standard wheelchair and moving in/out vehicle.  Diagnosis: Parkinsonism- G20, Gait Abnormality-R26.9, Disp: 1 Device, Rfl: 0   amoxicillin-clavulanate (AUGMENTIN) 875-125 MG tablet, Take 1 tablet by mouth 2 (two) times daily., Disp: 20 tablet, Rfl: 0   budesonide (PULMICORT) 0.5 MG/2ML nebulizer solution, Take 0.5 mg by nebulization daily as needed., Disp: 100 mL, Rfl: 10   carbidopa-levodopa (SINEMET IR) 25-100 MG tablet, Take 2 tablets by mouth 3 (three) times daily., Disp: 540  tablet, Rfl: 3   clotrimazole-betamethasone (LOTRISONE) cream, Apply 1 Application topically daily., Disp: 45 g, Rfl: 3   donepezil (ARICEPT) 5 MG tablet, Take 1 tablet (5 mg total) by mouth at bedtime., Disp: 90 tablet, Rfl: 1   Dulaglutide (TRULICITY) 1.5 TD/3.2KG SOPN, INJECT 1.5 MG(0.5ML) UNDER THE SKIN ONCE A WEEK., Disp: , Rfl:    fexofenadine-pseudoephedrine (ALLEGRA-D) 60-120 MG 12 hr tablet, Take 1 tablet by mouth daily as needed., Disp: , Rfl:    fluticasone (FLONASE) 50 MCG/ACT nasal spray, Place 2 sprays into both nostrils daily., Disp: 1 g, Rfl: 0   fluticasone (FLOVENT HFA) 44 MCG/ACT inhaler, Inhale 2 puffs into the lungs in the morning and at bedtime., Disp: 1 each, Rfl: 12   furosemide (LASIX) 20 MG tablet, TAKE 1 TABLET BY MOUTH DAILY, Disp: 90 tablet, Rfl: 3   insulin degludec (TRESIBA FLEXTOUCH) 200 UNIT/ML FlexTouch Pen, Inject 120 Units into the skin. , Disp: , Rfl:    latanoprost (XALATAN) 0.005 % ophthalmic solution, Place 1 drop into both eyes at bedtime., Disp: , Rfl:    Misc. Devices MISC, DX: Severe sleep apnea Start bipap 16/12 cm. Water with mask and supplies, Disp: , Rfl:    Multiple Vitamins-Minerals (MULTIVITAMIN ADULT, MINERALS, PO), Take 1 tablet by mouth., Disp: , Rfl:    NONFORMULARY OR COMPOUNDED ITEM, Please provide rolling walker.  Diagnosis: Gait instability, Disp: 1 each, Rfl: 0   nystatin-triamcinolone (MYCOLOG II) cream, APPLY TO AFFECTED AREA(S)  TOPICALLY TWICE DAILY, Disp: 30 g, Rfl: 1   omeprazole (PRILOSEC) 20 MG capsule, TAKE 1 CAPSULE BY MOUTH DAILY, Disp: 90 capsule, Rfl: 3   predniSONE (DELTASONE) 50 MG tablet, Take 1 tab po daily x5 days., Disp: 5 tablet, Rfl: 0   pregabalin (LYRICA) 200 MG capsule, Take 200 mg by mouth 2 (two) times daily. , Disp: , Rfl:    Probiotic Product (PROBIOTIC PO), Take 1 tablet by mouth daily., Disp: , Rfl:    rosuvastatin (CRESTOR) 10 MG tablet, TAKE 1 TABLET BY MOUTH DAILY, Disp: 90 tablet, Rfl: 3   sertraline  (ZOLOFT) 50 MG tablet, Take 50 mg by mouth daily., Disp: , Rfl:    tadalafil (CIALIS) 20 MG tablet, Take 0.5-1 tablets (10-20 mg total) by mouth every other day as needed (ED). Replaces sildenafil as tadalafil recently approved., Disp: 45 tablet, Rfl: 3   tamsulosin (FLOMAX) 0.4 MG CAPS capsule, TAKE 1 CAPSULE BY MOUTH DAILY, Disp: 90 capsule, Rfl: 3   traMADol (ULTRAM) 50 MG tablet, Take 1 tablet (50 mg total) by mouth every 6 (six) hours as needed., Disp: 40 tablet, Rfl: 1   valACYclovir (VALTREX) 500 MG tablet, Take 500 mg by mouth daily., Disp: , Rfl:    WIXELA INHUB 250-50 MCG/ACT AEPB, USE 1 INHALATION BY MOUTH IN THE MORNING AND AT BEDTIME, Disp: 180 each, Rfl: 3  fenofibrate 160 MG tablet, Take 1 tablet (160 mg total) by mouth daily., Disp: 90 tablet, Rfl: 1   Insulin Pen Needle (BD PEN NEEDLE NANO U/F) 32G X 4 MM MISC, USE WITH LEVEMIR FLEXTOUCH DAILY, Disp: 200 each, Rfl: 2  EXAM:  VITALS per patient if applicable: Ht 5\' 9"  (1.753 m)   Wt 245 lb (111.1 kg)   SpO2 94%   BMI 36.18 kg/m   GENERAL: alert, oriented, appears well and in no acute distress  HEENT: atraumatic, conjunttiva clear, no obvious abnormalities on inspection of external nose and ears  NECK: normal movements of the head and neck  LUNGS: on inspection no signs of respiratory distress, breathing rate appears normal, no obvious gross SOB, gasping or wheezing  CV: no obvious cyanosis  MS: moves all visible extremities without noticeable abnormality  PSYCH/NEURO: pleasant and cooperative, no obvious depression or anxiety, speech and thought processing grossly intact  ASSESSMENT AND PLAN:  Discussed the following assessment and plan:  Sinobronchitis Reminded to stay well-hydrated.  He may continue over-the-counter supportive care.  Adding Augmentin as well as prednisone burst.  Continue current inhalers.  Recommend that he stop by to have two-view chest x-ray completed.  Concerned about possible aspiration  with his Parkinson's.     I discussed the assessment and treatment plan with the patient. The patient was provided an opportunity to ask questions and all were answered. The patient agreed with the plan and demonstrated an understanding of the instructions.   The patient was advised to call back or seek an in-person evaluation if the symptoms worsen or if the condition fails to improve as anticipated.    Randall Nutting, DO

## 2022-06-05 NOTE — Assessment & Plan Note (Signed)
Reminded to stay well-hydrated.  He may continue over-the-counter supportive care.  Adding Augmentin as well as prednisone burst.  Continue current inhalers.  Recommend that he stop by to have two-view chest x-ray completed.  Concerned about possible aspiration with his Parkinson's.

## 2022-06-06 DIAGNOSIS — R482 Apraxia: Secondary | ICD-10-CM | POA: Diagnosis not present

## 2022-06-06 DIAGNOSIS — G232 Striatonigral degeneration: Secondary | ICD-10-CM | POA: Diagnosis not present

## 2022-06-06 DIAGNOSIS — R1312 Dysphagia, oropharyngeal phase: Secondary | ICD-10-CM | POA: Diagnosis not present

## 2022-06-06 DIAGNOSIS — I129 Hypertensive chronic kidney disease with stage 1 through stage 4 chronic kidney disease, or unspecified chronic kidney disease: Secondary | ICD-10-CM | POA: Diagnosis not present

## 2022-06-06 DIAGNOSIS — E1142 Type 2 diabetes mellitus with diabetic polyneuropathy: Secondary | ICD-10-CM | POA: Diagnosis not present

## 2022-06-06 DIAGNOSIS — G20 Parkinson's disease: Secondary | ICD-10-CM | POA: Diagnosis not present

## 2022-06-07 ENCOUNTER — Ambulatory Visit (INDEPENDENT_AMBULATORY_CARE_PROVIDER_SITE_OTHER): Payer: Medicare Other

## 2022-06-07 DIAGNOSIS — F419 Anxiety disorder, unspecified: Secondary | ICD-10-CM | POA: Diagnosis not present

## 2022-06-07 DIAGNOSIS — F32A Depression, unspecified: Secondary | ICD-10-CM | POA: Diagnosis not present

## 2022-06-07 DIAGNOSIS — R471 Dysarthria and anarthria: Secondary | ICD-10-CM | POA: Diagnosis not present

## 2022-06-07 DIAGNOSIS — G20 Parkinson's disease: Secondary | ICD-10-CM | POA: Diagnosis not present

## 2022-06-07 DIAGNOSIS — Z794 Long term (current) use of insulin: Secondary | ICD-10-CM | POA: Diagnosis not present

## 2022-06-07 DIAGNOSIS — R2689 Other abnormalities of gait and mobility: Secondary | ICD-10-CM | POA: Diagnosis not present

## 2022-06-07 DIAGNOSIS — E1140 Type 2 diabetes mellitus with diabetic neuropathy, unspecified: Secondary | ICD-10-CM | POA: Diagnosis not present

## 2022-06-07 DIAGNOSIS — Z7985 Long-term (current) use of injectable non-insulin antidiabetic drugs: Secondary | ICD-10-CM | POA: Diagnosis not present

## 2022-06-07 DIAGNOSIS — Z85118 Personal history of other malignant neoplasm of bronchus and lung: Secondary | ICD-10-CM | POA: Diagnosis not present

## 2022-06-07 DIAGNOSIS — K5900 Constipation, unspecified: Secondary | ICD-10-CM | POA: Diagnosis not present

## 2022-06-07 DIAGNOSIS — R059 Cough, unspecified: Secondary | ICD-10-CM

## 2022-06-07 DIAGNOSIS — G4752 REM sleep behavior disorder: Secondary | ICD-10-CM | POA: Diagnosis not present

## 2022-06-07 DIAGNOSIS — Z87891 Personal history of nicotine dependence: Secondary | ICD-10-CM | POA: Diagnosis not present

## 2022-06-07 DIAGNOSIS — R39198 Other difficulties with micturition: Secondary | ICD-10-CM | POA: Diagnosis not present

## 2022-06-07 DIAGNOSIS — Z79899 Other long term (current) drug therapy: Secondary | ICD-10-CM | POA: Diagnosis not present

## 2022-06-07 DIAGNOSIS — G232 Striatonigral degeneration: Secondary | ICD-10-CM | POA: Diagnosis not present

## 2022-06-07 DIAGNOSIS — Z8546 Personal history of malignant neoplasm of prostate: Secondary | ICD-10-CM | POA: Diagnosis not present

## 2022-06-21 ENCOUNTER — Telehealth: Payer: Self-pay

## 2022-06-21 DIAGNOSIS — G20 Parkinson's disease: Secondary | ICD-10-CM | POA: Diagnosis not present

## 2022-06-21 DIAGNOSIS — R1312 Dysphagia, oropharyngeal phase: Secondary | ICD-10-CM | POA: Diagnosis not present

## 2022-06-21 DIAGNOSIS — I129 Hypertensive chronic kidney disease with stage 1 through stage 4 chronic kidney disease, or unspecified chronic kidney disease: Secondary | ICD-10-CM | POA: Diagnosis not present

## 2022-06-21 DIAGNOSIS — R482 Apraxia: Secondary | ICD-10-CM | POA: Diagnosis not present

## 2022-06-21 DIAGNOSIS — G232 Striatonigral degeneration: Secondary | ICD-10-CM | POA: Diagnosis not present

## 2022-06-21 DIAGNOSIS — E1142 Type 2 diabetes mellitus with diabetic polyneuropathy: Secondary | ICD-10-CM | POA: Diagnosis not present

## 2022-06-21 NOTE — Telephone Encounter (Signed)
Received call from [facility] @ [facility] [phone]) requesting VO's for 6 additional swallow therapy visits.   VO granted. Documentation forthcoming.

## 2022-06-22 DIAGNOSIS — E785 Hyperlipidemia, unspecified: Secondary | ICD-10-CM | POA: Diagnosis present

## 2022-06-22 DIAGNOSIS — W19XXXA Unspecified fall, initial encounter: Secondary | ICD-10-CM | POA: Diagnosis not present

## 2022-06-22 DIAGNOSIS — M5126 Other intervertebral disc displacement, lumbar region: Secondary | ICD-10-CM | POA: Diagnosis present

## 2022-06-22 DIAGNOSIS — R509 Fever, unspecified: Secondary | ICD-10-CM | POA: Diagnosis not present

## 2022-06-22 DIAGNOSIS — N390 Urinary tract infection, site not specified: Secondary | ICD-10-CM | POA: Diagnosis present

## 2022-06-22 DIAGNOSIS — E1122 Type 2 diabetes mellitus with diabetic chronic kidney disease: Secondary | ICD-10-CM | POA: Diagnosis present

## 2022-06-22 DIAGNOSIS — I080 Rheumatic disorders of both mitral and aortic valves: Secondary | ICD-10-CM | POA: Diagnosis not present

## 2022-06-22 DIAGNOSIS — R5383 Other fatigue: Secondary | ICD-10-CM | POA: Diagnosis not present

## 2022-06-22 DIAGNOSIS — E669 Obesity, unspecified: Secondary | ICD-10-CM | POA: Diagnosis present

## 2022-06-22 DIAGNOSIS — R7881 Bacteremia: Secondary | ICD-10-CM | POA: Diagnosis not present

## 2022-06-22 DIAGNOSIS — R059 Cough, unspecified: Secondary | ICD-10-CM | POA: Diagnosis not present

## 2022-06-22 DIAGNOSIS — Z1152 Encounter for screening for COVID-19: Secondary | ICD-10-CM | POA: Diagnosis not present

## 2022-06-22 DIAGNOSIS — J189 Pneumonia, unspecified organism: Secondary | ICD-10-CM | POA: Diagnosis not present

## 2022-06-22 DIAGNOSIS — G4733 Obstructive sleep apnea (adult) (pediatric): Secondary | ICD-10-CM | POA: Diagnosis present

## 2022-06-22 DIAGNOSIS — N179 Acute kidney failure, unspecified: Secondary | ICD-10-CM | POA: Diagnosis not present

## 2022-06-22 DIAGNOSIS — Z6835 Body mass index (BMI) 35.0-35.9, adult: Secondary | ICD-10-CM | POA: Diagnosis not present

## 2022-06-22 DIAGNOSIS — I517 Cardiomegaly: Secondary | ICD-10-CM | POA: Diagnosis not present

## 2022-06-22 DIAGNOSIS — J1569 Pneumonia due to other gram-negative bacteria: Secondary | ICD-10-CM | POA: Diagnosis present

## 2022-06-22 DIAGNOSIS — C3491 Malignant neoplasm of unspecified part of right bronchus or lung: Secondary | ICD-10-CM | POA: Diagnosis not present

## 2022-06-22 DIAGNOSIS — E876 Hypokalemia: Secondary | ICD-10-CM | POA: Diagnosis not present

## 2022-06-22 DIAGNOSIS — R0602 Shortness of breath: Secondary | ICD-10-CM | POA: Diagnosis not present

## 2022-06-22 DIAGNOSIS — G20A1 Parkinson's disease without dyskinesia, without mention of fluctuations: Secondary | ICD-10-CM | POA: Diagnosis present

## 2022-06-22 DIAGNOSIS — N183 Chronic kidney disease, stage 3 unspecified: Secondary | ICD-10-CM | POA: Diagnosis not present

## 2022-06-22 DIAGNOSIS — R339 Retention of urine, unspecified: Secondary | ICD-10-CM | POA: Diagnosis present

## 2022-06-22 DIAGNOSIS — K219 Gastro-esophageal reflux disease without esophagitis: Secondary | ICD-10-CM | POA: Diagnosis present

## 2022-06-22 DIAGNOSIS — M1990 Unspecified osteoarthritis, unspecified site: Secondary | ICD-10-CM | POA: Diagnosis present

## 2022-06-22 DIAGNOSIS — Z20822 Contact with and (suspected) exposure to covid-19: Secondary | ICD-10-CM | POA: Diagnosis not present

## 2022-06-22 DIAGNOSIS — I129 Hypertensive chronic kidney disease with stage 1 through stage 4 chronic kidney disease, or unspecified chronic kidney disease: Secondary | ICD-10-CM | POA: Diagnosis not present

## 2022-06-22 DIAGNOSIS — Y95 Nosocomial condition: Secondary | ICD-10-CM | POA: Diagnosis not present

## 2022-06-22 DIAGNOSIS — E860 Dehydration: Secondary | ICD-10-CM | POA: Diagnosis present

## 2022-06-22 DIAGNOSIS — E1121 Type 2 diabetes mellitus with diabetic nephropathy: Secondary | ICD-10-CM | POA: Diagnosis not present

## 2022-06-22 DIAGNOSIS — Z8546 Personal history of malignant neoplasm of prostate: Secondary | ICD-10-CM | POA: Diagnosis not present

## 2022-06-22 DIAGNOSIS — F419 Anxiety disorder, unspecified: Secondary | ICD-10-CM | POA: Diagnosis present

## 2022-06-22 DIAGNOSIS — Z743 Need for continuous supervision: Secondary | ICD-10-CM | POA: Diagnosis not present

## 2022-06-22 DIAGNOSIS — R531 Weakness: Secondary | ICD-10-CM | POA: Diagnosis not present

## 2022-06-22 DIAGNOSIS — Z87891 Personal history of nicotine dependence: Secondary | ICD-10-CM | POA: Diagnosis not present

## 2022-06-22 DIAGNOSIS — F0390 Unspecified dementia without behavioral disturbance: Secondary | ICD-10-CM | POA: Diagnosis present

## 2022-06-22 DIAGNOSIS — D509 Iron deficiency anemia, unspecified: Secondary | ICD-10-CM | POA: Diagnosis present

## 2022-06-27 DIAGNOSIS — K219 Gastro-esophageal reflux disease without esophagitis: Secondary | ICD-10-CM | POA: Diagnosis not present

## 2022-06-27 DIAGNOSIS — C3491 Malignant neoplasm of unspecified part of right bronchus or lung: Secondary | ICD-10-CM | POA: Diagnosis not present

## 2022-06-27 DIAGNOSIS — N183 Chronic kidney disease, stage 3 unspecified: Secondary | ICD-10-CM | POA: Diagnosis not present

## 2022-06-27 DIAGNOSIS — E1122 Type 2 diabetes mellitus with diabetic chronic kidney disease: Secondary | ICD-10-CM | POA: Diagnosis not present

## 2022-06-27 DIAGNOSIS — G20C Parkinsonism, unspecified: Secondary | ICD-10-CM | POA: Diagnosis not present

## 2022-06-27 DIAGNOSIS — F419 Anxiety disorder, unspecified: Secondary | ICD-10-CM | POA: Diagnosis not present

## 2022-06-27 DIAGNOSIS — Z902 Acquired absence of lung [part of]: Secondary | ICD-10-CM | POA: Diagnosis not present

## 2022-06-27 DIAGNOSIS — Z7985 Long-term (current) use of injectable non-insulin antidiabetic drugs: Secondary | ICD-10-CM | POA: Diagnosis not present

## 2022-06-27 DIAGNOSIS — I129 Hypertensive chronic kidney disease with stage 1 through stage 4 chronic kidney disease, or unspecified chronic kidney disease: Secondary | ICD-10-CM | POA: Diagnosis not present

## 2022-06-27 DIAGNOSIS — M1990 Unspecified osteoarthritis, unspecified site: Secondary | ICD-10-CM | POA: Diagnosis not present

## 2022-06-27 DIAGNOSIS — C61 Malignant neoplasm of prostate: Secondary | ICD-10-CM | POA: Diagnosis not present

## 2022-06-27 DIAGNOSIS — Z794 Long term (current) use of insulin: Secondary | ICD-10-CM | POA: Diagnosis not present

## 2022-06-27 DIAGNOSIS — J189 Pneumonia, unspecified organism: Secondary | ICD-10-CM | POA: Diagnosis not present

## 2022-06-27 DIAGNOSIS — R1312 Dysphagia, oropharyngeal phase: Secondary | ICD-10-CM | POA: Diagnosis not present

## 2022-06-27 DIAGNOSIS — F0280 Dementia in other diseases classified elsewhere without behavioral disturbance: Secondary | ICD-10-CM | POA: Diagnosis not present

## 2022-06-27 DIAGNOSIS — Z87891 Personal history of nicotine dependence: Secondary | ICD-10-CM | POA: Diagnosis not present

## 2022-06-28 ENCOUNTER — Telehealth: Payer: Self-pay | Admitting: General Practice

## 2022-06-28 DIAGNOSIS — R1312 Dysphagia, oropharyngeal phase: Secondary | ICD-10-CM | POA: Diagnosis not present

## 2022-06-28 DIAGNOSIS — F0280 Dementia in other diseases classified elsewhere without behavioral disturbance: Secondary | ICD-10-CM | POA: Diagnosis not present

## 2022-06-28 DIAGNOSIS — E1122 Type 2 diabetes mellitus with diabetic chronic kidney disease: Secondary | ICD-10-CM | POA: Diagnosis not present

## 2022-06-28 DIAGNOSIS — G20C Parkinsonism, unspecified: Secondary | ICD-10-CM | POA: Diagnosis not present

## 2022-06-28 DIAGNOSIS — J189 Pneumonia, unspecified organism: Secondary | ICD-10-CM | POA: Diagnosis not present

## 2022-06-28 DIAGNOSIS — I129 Hypertensive chronic kidney disease with stage 1 through stage 4 chronic kidney disease, or unspecified chronic kidney disease: Secondary | ICD-10-CM | POA: Diagnosis not present

## 2022-06-28 NOTE — Telephone Encounter (Signed)
Transition Care Management Unsuccessful Follow-up Telephone Call  Date of discharge and from where:  06/26/22 from [facility]  Attempts:  1st Attempt  Reason for unsuccessful TCM follow-up call:  Left voice message

## 2022-07-01 ENCOUNTER — Telehealth: Payer: Self-pay | Admitting: Family Medicine

## 2022-07-01 ENCOUNTER — Telehealth: Payer: Self-pay

## 2022-07-01 NOTE — Telephone Encounter (Signed)
Patient wife wants to know how she can go about getting a hospital bed?

## 2022-07-01 NOTE — Telephone Encounter (Signed)
Patient daughter Sonia Baller) calling for patient.  States had to call (!! 4 or 5 times last week to get him up - after release from hospital.  She states his wife is leaving the home and the patient will be by himself and can not care for himself - requesting paperwork for nursing home placement . Per Dr. Zigmund Daniel patient can do virtual visit to discuss . Patient schld for 07/02/22 at 1:10 pm

## 2022-07-02 ENCOUNTER — Encounter: Payer: Self-pay | Admitting: Family Medicine

## 2022-07-02 ENCOUNTER — Telehealth (INDEPENDENT_AMBULATORY_CARE_PROVIDER_SITE_OTHER): Payer: Medicare Other | Admitting: Family Medicine

## 2022-07-02 DIAGNOSIS — E1122 Type 2 diabetes mellitus with diabetic chronic kidney disease: Secondary | ICD-10-CM | POA: Diagnosis not present

## 2022-07-02 DIAGNOSIS — J189 Pneumonia, unspecified organism: Secondary | ICD-10-CM | POA: Diagnosis not present

## 2022-07-02 DIAGNOSIS — R1312 Dysphagia, oropharyngeal phase: Secondary | ICD-10-CM | POA: Diagnosis not present

## 2022-07-02 DIAGNOSIS — G20C Parkinsonism, unspecified: Secondary | ICD-10-CM

## 2022-07-02 DIAGNOSIS — I129 Hypertensive chronic kidney disease with stage 1 through stage 4 chronic kidney disease, or unspecified chronic kidney disease: Secondary | ICD-10-CM | POA: Diagnosis not present

## 2022-07-02 DIAGNOSIS — F0280 Dementia in other diseases classified elsewhere without behavioral disturbance: Secondary | ICD-10-CM | POA: Diagnosis not present

## 2022-07-02 NOTE — Progress Notes (Signed)
Randall Fisher - 66 y.o. male MRN [id number]  Date of birth: Feb 14, 1956   This visit type was conducted due to national recommendations for restrictions regarding the COVID-19 Pandemic (e.g. social distancing).  This format is felt to be most appropriate for this patient at this time.  All issues noted in this document were discussed and addressed.  No physical exam was performed (except for noted visual exam findings with Video Visits).  I discussed the limitations of evaluation and management by telemedicine and the availability of in person appointments. The patient expressed understanding and agreed to proceed.  I connected withNAME@ on 07/02/22 at  1:10 PM EDT by a video enabled telemedicine application and verified that I am speaking with the correct person using two identifiers.  Present at visit: [name], DO [name]   Patient Location: [address] [place] [address]   [place] location:   [place]  No chief complaint on file.   HPI  Randall Fisher is a 66 y.o. male who presents via audio/video conferencing for a telehealth visit today.  He has had increasing difficulty with mobility as well as increased falls recently.  Recent hospital admission for pneumonia.  He does feel better from this however still has generalized weakness.  His Parkinson's does continue to progress as well.  Unfortunately his wife has left him and moved out of the house.  His sister accompanies him on this virtual visit today.  She reports that he has limited ability to care for himself at home.  She believes he would do better with assisted living or skilled nursing.  He is not ready to leave his home however realizes that he cannot do the things that he needs to do while living there alone.  He does use a walker for ambulation.  No difficulty with eating or drinking.  He does need some assistance at times with hygiene.  No issues with incontinence.  ROS:  A comprehensive ROS was  completed and negative except as noted per HPI    ROS:  A comprehensive ROS was completed and negative except as noted per HPI  Past Medical History:  Diagnosis Date   Back pain    Diabetes mellitus without complication (HCC)    Hyperlipidemia    Joint pain    Kidney disease    Leg swelling    Lung cancer (HCC)    Memory loss    Neuropathy    Numbness and tingling in both hands    S/P lobectomy of lung 2013   RUL for NSCLC    Past Surgical History:  Procedure Laterality Date   CARPAL TUNNEL RELEASE Bilateral    LUNG LOBECTOMY Right 2013   TONSILLECTOMY      Family History  Problem Relation Age of Onset   Diabetes Mother    Kidney failure Father     Social History   Socioeconomic History   Marital status: Married    Spouse name: [name]   Number of children: 1   Years of education: some college   Highest education level: Not on file  Occupational History   Occupation: unemployed - laid off   Occupation: seeking disability  Tobacco Use   Smoking status: Former   Smokeless tobacco: Never  Substance and Sexual Activity   Alcohol use: Not Currently   Drug use: Never   Sexual activity: Yes    Partners: Female    Birth control/protection: Condom  Other Topics Concern   Not  on file  Social History Narrative   Lives at home with his wife.   1-2 cups caffeine per day.   Right-handed.      Social Determinants of Health   Financial Resource Strain: Not on file  Food Insecurity: Not on file  Transportation Needs: Not on file  Physical Activity: Not on file  Stress: Not on file  Social Connections: Not on file  Intimate Partner Violence: Not on file     Current Outpatient Medications:    albuterol (PROVENTIL) (2.5 MG/3ML) 0.083% nebulizer solution, Take 3 mLs (2.5 mg total) by nebulization every 6 (six) hours as needed for wheezing or shortness of breath., Disp: 150 mL, Rfl: 1   AMBULATORY NON FORMULARY MEDICATION, Please provide lightweight transport  chair.  Patient unable to propel himself in standard manual wheelchair.  Wife has trouble pushing standard wheelchair and moving in/out vehicle.  Diagnosis: Parkinsonism- G20, Gait Abnormality-R26.9, Disp: 1 Device, Rfl: 0   amoxicillin-clavulanate (AUGMENTIN) 875-125 MG tablet, Take 1 tablet by mouth 2 (two) times daily., Disp: 20 tablet, Rfl: 0   budesonide (PULMICORT) 0.5 MG/2ML nebulizer solution, Take 0.5 mg by nebulization daily as needed., Disp: 100 mL, Rfl: 10   carbidopa-levodopa (SINEMET IR) 25-100 MG tablet, Take 2 tablets by mouth 3 (three) times daily., Disp: 540 tablet, Rfl: 3   clotrimazole-betamethasone (LOTRISONE) cream, Apply 1 Application topically daily., Disp: 45 g, Rfl: 3   donepezil (ARICEPT) 5 MG tablet, Take 1 tablet (5 mg total) by mouth at bedtime., Disp: 90 tablet, Rfl: 1   Dulaglutide (TRULICITY) 1.5 XL/2.4MW SOPN, INJECT 1.5 MG(0.5ML) UNDER THE SKIN ONCE A WEEK., Disp: , Rfl:    fenofibrate 160 MG tablet, Take 1 tablet (160 mg total) by mouth daily., Disp: 90 tablet, Rfl: 1   fexofenadine-pseudoephedrine (ALLEGRA-D) 60-120 MG 12 hr tablet, Take 1 tablet by mouth daily as needed., Disp: , Rfl:    fluticasone (FLONASE) 50 MCG/ACT nasal spray, Place 2 sprays into both nostrils daily., Disp: 1 g, Rfl: 0   fluticasone (FLOVENT HFA) 44 MCG/ACT inhaler, Inhale 2 puffs into the lungs in the morning and at bedtime., Disp: 1 each, Rfl: 12   furosemide (LASIX) 20 MG tablet, TAKE 1 TABLET BY MOUTH DAILY, Disp: 90 tablet, Rfl: 3   insulin degludec (TRESIBA FLEXTOUCH) 200 UNIT/ML FlexTouch Pen, Inject 120 Units into the skin. , Disp: , Rfl:    Insulin Pen Needle (BD PEN NEEDLE NANO U/F) 32G X 4 MM MISC, USE WITH LEVEMIR FLEXTOUCH DAILY, Disp: 200 each, Rfl: 2   latanoprost (XALATAN) 0.005 % ophthalmic solution, Place 1 drop into both eyes at bedtime., Disp: , Rfl:    Misc. Devices MISC, DX: Severe sleep apnea Start bipap 16/12 cm. Water with mask and supplies, Disp: , Rfl:    Multiple  Vitamins-Minerals (MULTIVITAMIN ADULT, MINERALS, PO), Take 1 tablet by mouth., Disp: , Rfl:    NONFORMULARY OR COMPOUNDED ITEM, Please provide rolling walker.  Diagnosis: Gait instability, Disp: 1 each, Rfl: 0   nystatin-triamcinolone (MYCOLOG II) cream, APPLY TO AFFECTED AREA(S)  TOPICALLY TWICE DAILY, Disp: 30 g, Rfl: 1   omeprazole (PRILOSEC) 20 MG capsule, TAKE 1 CAPSULE BY MOUTH DAILY, Disp: 90 capsule, Rfl: 3   predniSONE (DELTASONE) 50 MG tablet, Take 1 tab po daily x5 days., Disp: 5 tablet, Rfl: 0   pregabalin (LYRICA) 200 MG capsule, Take 200 mg by mouth 2 (two) times daily. , Disp: , Rfl:    Probiotic Product (PROBIOTIC PO), Take 1 tablet by  mouth daily., Disp: , Rfl:    rosuvastatin (CRESTOR) 10 MG tablet, TAKE 1 TABLET BY MOUTH DAILY, Disp: 90 tablet, Rfl: 3   sertraline (ZOLOFT) 50 MG tablet, Take 50 mg by mouth daily., Disp: , Rfl:    tadalafil (CIALIS) 20 MG tablet, Take 0.5-1 tablets (10-20 mg total) by mouth every other day as needed (ED). Replaces sildenafil as tadalafil recently approved., Disp: 45 tablet, Rfl: 3   tamsulosin (FLOMAX) 0.4 MG CAPS capsule, TAKE 1 CAPSULE BY MOUTH DAILY, Disp: 90 capsule, Rfl: 3   traMADol (ULTRAM) 50 MG tablet, Take 1 tablet (50 mg total) by mouth every 6 (six) hours as needed., Disp: 40 tablet, Rfl: 1   valACYclovir (VALTREX) 500 MG tablet, Take 500 mg by mouth daily., Disp: , Rfl:    WIXELA INHUB 250-50 MCG/ACT AEPB, USE 1 INHALATION BY MOUTH IN THE MORNING AND AT BEDTIME, Disp: 180 each, Rfl: 3  EXAM:  VITALS per patient if applicable: There were no vitals taken for this visit.  GENERAL: alert, oriented, appears well and in no acute distress  HEENT: atraumatic, conjunttiva clear, no obvious abnormalities on inspection of external nose and ears  NECK: normal movements of the head and neck  LUNGS: on inspection no signs of respiratory distress, breathing rate appears normal, no obvious gross SOB, gasping or wheezing  CV: no obvious  cyanosis    ASSESSMENT AND PLAN:  Discussed the following assessment and plan:  Parkinsonism ([place]) He has had continued progression of his Parkinson's which is contributed to recurrent falls and generalized weakness.  Additionally he had hospitalization for pneumonia recently.  He has been unable to come to an in person visit for quite some time due to his ambulation difficulty.  His wife leaving him has complicated matters as well as he does not have any additional assistance at home..  I do think he would benefit from at the very least assisted living however skilled nursing with rehab may be more beneficial for him.  He and his sister will tour a few facilities and let me know what they decide.  We will plan to fill out FL 2 once they have figured out where he would like admission.  30 minutes spent including pre visit preparation, review of prior notes and labs, encounter with patient via video visit and same day documentation.    I discussed the assessment and treatment plan with the patient. The patient was provided an opportunity to ask questions and all were answered. The patient agreed with the plan and demonstrated an understanding of the instructions.   The patient was advised to call back or seek an in-person evaluation if the symptoms worsen or if the condition fails to improve as anticipated.    [name], DO

## 2022-07-02 NOTE — Assessment & Plan Note (Signed)
He has had continued progression of his Parkinson's which is contributed to recurrent falls and generalized weakness.  Additionally he had hospitalization for pneumonia recently.  He has been unable to come to an in person visit for quite some time due to his ambulation difficulty.  His wife leaving him has complicated matters as well as he does not have any additional assistance at home..  I do think he would benefit from at the very least assisted living however skilled nursing with rehab may be more beneficial for him.  He and his sister will tour a few facilities and let me know what they decide.  We will plan to fill out FL 2 once they have figured out where he would like admission.

## 2022-07-03 ENCOUNTER — Telehealth: Payer: Self-pay

## 2022-07-03 NOTE — Telephone Encounter (Signed)
Patient daughter - Gwendolyn Lima  [phone]- called for patient- stateas she has called numerous rehab facilities -no one will accept him unless he is in hospital .  States he has falled two times since the virtual  visit.without injury- she is wondering if he could get hospital placement so they could do an admitt-0 she states that she knows that also long term care was discussed.

## 2022-07-03 NOTE — Telephone Encounter (Signed)
Can certainly take him to the hospital to be evaluated to see if there is any new causes of his falls such as UTI or non-resolving pneumonia.

## 2022-07-04 DIAGNOSIS — I129 Hypertensive chronic kidney disease with stage 1 through stage 4 chronic kidney disease, or unspecified chronic kidney disease: Secondary | ICD-10-CM | POA: Diagnosis not present

## 2022-07-04 DIAGNOSIS — F0280 Dementia in other diseases classified elsewhere without behavioral disturbance: Secondary | ICD-10-CM | POA: Diagnosis not present

## 2022-07-04 DIAGNOSIS — J189 Pneumonia, unspecified organism: Secondary | ICD-10-CM | POA: Diagnosis not present

## 2022-07-04 DIAGNOSIS — E1122 Type 2 diabetes mellitus with diabetic chronic kidney disease: Secondary | ICD-10-CM | POA: Diagnosis not present

## 2022-07-04 DIAGNOSIS — G20C Parkinsonism, unspecified: Secondary | ICD-10-CM | POA: Diagnosis not present

## 2022-07-04 DIAGNOSIS — R1312 Dysphagia, oropharyngeal phase: Secondary | ICD-10-CM | POA: Diagnosis not present

## 2022-07-04 NOTE — Telephone Encounter (Signed)
Patient daughter Sonia Baller shephard informed and will take the patient to the ER for frequent falling evaluation to see if anything can be done with rehab facility placement while there.

## 2022-07-04 NOTE — Telephone Encounter (Signed)
Called and left voicemail to have patient return ourcall.

## 2022-07-08 NOTE — Telephone Encounter (Signed)
Transition Care Management Follow-up Telephone Call Date of discharge and from where: 06/26/22 from [facility] How have you been since you were released from the hospital? Patient had virtual visit with PCP on 07/02/22 Any questions or concerns? No

## 2022-07-09 DIAGNOSIS — J189 Pneumonia, unspecified organism: Secondary | ICD-10-CM | POA: Diagnosis not present

## 2022-07-09 DIAGNOSIS — F0280 Dementia in other diseases classified elsewhere without behavioral disturbance: Secondary | ICD-10-CM | POA: Diagnosis not present

## 2022-07-09 DIAGNOSIS — E1122 Type 2 diabetes mellitus with diabetic chronic kidney disease: Secondary | ICD-10-CM | POA: Diagnosis not present

## 2022-07-09 DIAGNOSIS — R1312 Dysphagia, oropharyngeal phase: Secondary | ICD-10-CM | POA: Diagnosis not present

## 2022-07-09 DIAGNOSIS — I129 Hypertensive chronic kidney disease with stage 1 through stage 4 chronic kidney disease, or unspecified chronic kidney disease: Secondary | ICD-10-CM | POA: Diagnosis not present

## 2022-07-09 DIAGNOSIS — G20C Parkinsonism, unspecified: Secondary | ICD-10-CM | POA: Diagnosis not present

## 2022-07-11 ENCOUNTER — Ambulatory Visit (INDEPENDENT_AMBULATORY_CARE_PROVIDER_SITE_OTHER): Payer: Medicare Other | Admitting: Family Medicine

## 2022-07-11 ENCOUNTER — Encounter: Payer: Self-pay | Admitting: Family Medicine

## 2022-07-11 ENCOUNTER — Inpatient Hospital Stay: Payer: Medicare Other | Admitting: Family Medicine

## 2022-07-11 ENCOUNTER — Ambulatory Visit (INDEPENDENT_AMBULATORY_CARE_PROVIDER_SITE_OTHER): Payer: Medicare Other

## 2022-07-11 VITALS — BP 100/60 | HR 72 | Temp 98.1°F | Ht 69.0 in | Wt 245.0 lb

## 2022-07-11 DIAGNOSIS — J189 Pneumonia, unspecified organism: Secondary | ICD-10-CM | POA: Diagnosis not present

## 2022-07-11 DIAGNOSIS — R0989 Other specified symptoms and signs involving the circulatory and respiratory systems: Secondary | ICD-10-CM | POA: Diagnosis not present

## 2022-07-11 DIAGNOSIS — R1312 Dysphagia, oropharyngeal phase: Secondary | ICD-10-CM | POA: Diagnosis not present

## 2022-07-11 DIAGNOSIS — N184 Chronic kidney disease, stage 4 (severe): Secondary | ICD-10-CM

## 2022-07-11 DIAGNOSIS — E1122 Type 2 diabetes mellitus with diabetic chronic kidney disease: Secondary | ICD-10-CM | POA: Diagnosis not present

## 2022-07-11 DIAGNOSIS — R0600 Dyspnea, unspecified: Secondary | ICD-10-CM | POA: Diagnosis not present

## 2022-07-11 DIAGNOSIS — G20C Parkinsonism, unspecified: Secondary | ICD-10-CM

## 2022-07-11 DIAGNOSIS — I10 Essential (primary) hypertension: Secondary | ICD-10-CM | POA: Diagnosis not present

## 2022-07-11 DIAGNOSIS — Z794 Long term (current) use of insulin: Secondary | ICD-10-CM

## 2022-07-11 DIAGNOSIS — R0602 Shortness of breath: Secondary | ICD-10-CM | POA: Diagnosis not present

## 2022-07-11 DIAGNOSIS — R269 Unspecified abnormalities of gait and mobility: Secondary | ICD-10-CM

## 2022-07-11 DIAGNOSIS — F0280 Dementia in other diseases classified elsewhere without behavioral disturbance: Secondary | ICD-10-CM | POA: Diagnosis not present

## 2022-07-11 DIAGNOSIS — N1831 Chronic kidney disease, stage 3a: Secondary | ICD-10-CM

## 2022-07-11 DIAGNOSIS — I129 Hypertensive chronic kidney disease with stage 1 through stage 4 chronic kidney disease, or unspecified chronic kidney disease: Secondary | ICD-10-CM | POA: Diagnosis not present

## 2022-07-11 NOTE — Patient Instructions (Signed)
Look at [facility] or [facility] for assisted living Mark be in touch with xray and lab results.

## 2022-07-12 ENCOUNTER — Other Ambulatory Visit: Payer: Self-pay | Admitting: Family Medicine

## 2022-07-12 DIAGNOSIS — I129 Hypertensive chronic kidney disease with stage 1 through stage 4 chronic kidney disease, or unspecified chronic kidney disease: Secondary | ICD-10-CM | POA: Diagnosis not present

## 2022-07-12 DIAGNOSIS — R1312 Dysphagia, oropharyngeal phase: Secondary | ICD-10-CM | POA: Diagnosis not present

## 2022-07-12 DIAGNOSIS — G20C Parkinsonism, unspecified: Secondary | ICD-10-CM | POA: Diagnosis not present

## 2022-07-12 DIAGNOSIS — J189 Pneumonia, unspecified organism: Secondary | ICD-10-CM | POA: Diagnosis not present

## 2022-07-12 DIAGNOSIS — F0280 Dementia in other diseases classified elsewhere without behavioral disturbance: Secondary | ICD-10-CM | POA: Diagnosis not present

## 2022-07-12 DIAGNOSIS — E1122 Type 2 diabetes mellitus with diabetic chronic kidney disease: Secondary | ICD-10-CM | POA: Diagnosis not present

## 2022-07-12 LAB — CBC WITH DIFFERENTIAL/PLATELET
Absolute Monocytes: 735 cells/uL (ref 200–950)
Basophils Absolute: 65 cells/uL (ref 0–200)
Basophils Relative: 0.5 %
Eosinophils Absolute: 65 cells/uL (ref 15–500)
Eosinophils Relative: 0.5 %
HCT: 34.9 % — ABNORMAL LOW (ref 38.5–50.0)
Hemoglobin: 10.6 g/dL — ABNORMAL LOW (ref 13.2–17.1)
Lymphs Abs: 2232 cells/uL (ref 850–3900)
MCH: 24.5 pg — ABNORMAL LOW (ref 27.0–33.0)
MCHC: 30.4 g/dL — ABNORMAL LOW (ref 32.0–36.0)
MCV: 80.8 fL (ref 80.0–100.0)
MPV: 11.1 fL (ref 7.5–12.5)
Monocytes Relative: 5.7 %
Neutro Abs: 9804 cells/uL — ABNORMAL HIGH (ref 1500–7800)
Neutrophils Relative %: 76 %
Platelets: 276 10*3/uL (ref 140–400)
RBC: 4.32 10*6/uL (ref 4.20–5.80)
RDW: 16.2 % — ABNORMAL HIGH (ref 11.0–15.0)
Total Lymphocyte: 17.3 %
WBC: 12.9 10*3/uL — ABNORMAL HIGH (ref 3.8–10.8)

## 2022-07-12 LAB — COMPLETE METABOLIC PANEL WITH GFR
AG Ratio: 1.6 (calc) (ref 1.0–2.5)
ALT: 15 U/L (ref 9–46)
AST: 16 U/L (ref 10–35)
Albumin: 4.3 g/dL (ref 3.6–5.1)
Alkaline phosphatase (APISO): 40 U/L (ref 35–144)
BUN/Creatinine Ratio: 17 (calc) (ref 6–22)
BUN: 25 mg/dL (ref 7–25)
CO2: 33 mmol/L — ABNORMAL HIGH (ref 20–32)
Calcium: 9.7 mg/dL (ref 8.6–10.3)
Chloride: 103 mmol/L (ref 98–110)
Creat: 1.44 mg/dL — ABNORMAL HIGH (ref 0.70–1.35)
Globulin: 2.7 g/dL (calc) (ref 1.9–3.7)
Glucose, Bld: 48 mg/dL — ABNORMAL LOW (ref 65–99)
Potassium: 4 mmol/L (ref 3.5–5.3)
Sodium: 143 mmol/L (ref 135–146)
Total Bilirubin: 0.5 mg/dL (ref 0.2–1.2)
Total Protein: 7 g/dL (ref 6.1–8.1)
eGFR: 54 mL/min/{1.73_m2} — ABNORMAL LOW (ref >=60)

## 2022-07-12 LAB — TSH: TSH: 0.76 mIU/L (ref 0.40–4.50)

## 2022-07-12 LAB — HEMOGLOBIN A1C
Hgb A1c MFr Bld: 7.5 % of total Hgb — ABNORMAL HIGH (ref <5.7)
Mean Plasma Glucose: 169 mg/dL
eAG (mmol/L): 9.3 mmol/L

## 2022-07-12 LAB — VITAMIN D 25 HYDROXY (VIT D DEFICIENCY, FRACTURES): Vit D, 25-Hydroxy: 29 ng/mL — ABNORMAL LOW (ref 30–100)

## 2022-07-12 MED ORDER — DOXYCYCLINE HYCLATE 100 MG PO TABS: 100.0000 mg | ORAL_TABLET | Freq: Two times a day (BID) | ORAL | 0 refills | Status: DC

## 2022-07-14 DIAGNOSIS — R0600 Dyspnea, unspecified: Secondary | ICD-10-CM | POA: Insufficient documentation
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
## 2022-07-14 NOTE — Assessment & Plan Note (Signed)
Possibly related to deconditioning.  He does have some rales on exam updated chest x-ray ordered.  Additional labs ordered today.

## 2022-07-14 NOTE — Assessment & Plan Note (Addendum)
He is not monitoring glucose at home.  Updating A1c.

## 2022-07-14 NOTE — Assessment & Plan Note (Signed)
He has been through multiple episodes of home physical therapy without significant improvement to his strength and gait.  Continues to have falls.  We had a long discussion regarding his safety at home.  Discussed with him that I do not feel like he was safe to continue independent living at home.  Recommend minimum of assisted living care.  Recommend that he and his family tour facilities in the area.  Given handout on resources.  He may be able to continue to stay at home if he is able to get a home health aide or if family members can provide around-the-clock care for him, however this does not seem feasible.

## 2022-07-14 NOTE — Assessment & Plan Note (Signed)
Continues to progress.  Currently managed by neurology.

## 2022-07-16 ENCOUNTER — Telehealth: Payer: Self-pay

## 2022-07-16 DIAGNOSIS — I129 Hypertensive chronic kidney disease with stage 1 through stage 4 chronic kidney disease, or unspecified chronic kidney disease: Secondary | ICD-10-CM | POA: Diagnosis not present

## 2022-07-16 DIAGNOSIS — F0280 Dementia in other diseases classified elsewhere without behavioral disturbance: Secondary | ICD-10-CM | POA: Diagnosis not present

## 2022-07-16 DIAGNOSIS — E1122 Type 2 diabetes mellitus with diabetic chronic kidney disease: Secondary | ICD-10-CM | POA: Diagnosis not present

## 2022-07-16 DIAGNOSIS — G20C Parkinsonism, unspecified: Secondary | ICD-10-CM | POA: Diagnosis not present

## 2022-07-16 DIAGNOSIS — R1312 Dysphagia, oropharyngeal phase: Secondary | ICD-10-CM | POA: Diagnosis not present

## 2022-07-16 DIAGNOSIS — J189 Pneumonia, unspecified organism: Secondary | ICD-10-CM | POA: Diagnosis not present

## 2022-07-16 NOTE — Telephone Encounter (Signed)
Ok to give VO.  Fall noted.  Have they looked at assisted living facilities?

## 2022-07-16 NOTE — Telephone Encounter (Signed)
Danielle @ [facility] [phone]) lvm requesting verbal orders. Completed MSW evaluation last week. His son is requesting additional visits. Needs an order for another MSW.  Pt fell at home on 07/15/22 in the kitchen while pivoting in the fridge. Pt's brother was there and helped him. No injury or head trauma.

## 2022-07-17 NOTE — Telephone Encounter (Signed)
VO's given to [facility] for MSW evaluation. Fax number given for documentation.

## 2022-07-18 DIAGNOSIS — J189 Pneumonia, unspecified organism: Secondary | ICD-10-CM | POA: Diagnosis not present

## 2022-07-18 DIAGNOSIS — G20C Parkinsonism, unspecified: Secondary | ICD-10-CM | POA: Diagnosis not present

## 2022-07-18 DIAGNOSIS — F0280 Dementia in other diseases classified elsewhere without behavioral disturbance: Secondary | ICD-10-CM | POA: Diagnosis not present

## 2022-07-18 DIAGNOSIS — I129 Hypertensive chronic kidney disease with stage 1 through stage 4 chronic kidney disease, or unspecified chronic kidney disease: Secondary | ICD-10-CM | POA: Diagnosis not present

## 2022-07-18 DIAGNOSIS — R1312 Dysphagia, oropharyngeal phase: Secondary | ICD-10-CM | POA: Diagnosis not present

## 2022-07-18 DIAGNOSIS — E1122 Type 2 diabetes mellitus with diabetic chronic kidney disease: Secondary | ICD-10-CM | POA: Diagnosis not present

## 2022-07-26 DIAGNOSIS — R1312 Dysphagia, oropharyngeal phase: Secondary | ICD-10-CM | POA: Diagnosis not present

## 2022-07-26 DIAGNOSIS — I129 Hypertensive chronic kidney disease with stage 1 through stage 4 chronic kidney disease, or unspecified chronic kidney disease: Secondary | ICD-10-CM | POA: Diagnosis not present

## 2022-07-26 DIAGNOSIS — E1122 Type 2 diabetes mellitus with diabetic chronic kidney disease: Secondary | ICD-10-CM | POA: Diagnosis not present

## 2022-07-26 DIAGNOSIS — J189 Pneumonia, unspecified organism: Secondary | ICD-10-CM | POA: Diagnosis not present

## 2022-07-26 DIAGNOSIS — F0280 Dementia in other diseases classified elsewhere without behavioral disturbance: Secondary | ICD-10-CM | POA: Diagnosis not present

## 2022-07-26 DIAGNOSIS — G20C Parkinsonism, unspecified: Secondary | ICD-10-CM | POA: Diagnosis not present

## 2022-07-27 DIAGNOSIS — F419 Anxiety disorder, unspecified: Secondary | ICD-10-CM | POA: Diagnosis not present

## 2022-07-27 DIAGNOSIS — Z87891 Personal history of nicotine dependence: Secondary | ICD-10-CM | POA: Diagnosis not present

## 2022-07-27 DIAGNOSIS — E1122 Type 2 diabetes mellitus with diabetic chronic kidney disease: Secondary | ICD-10-CM | POA: Diagnosis not present

## 2022-07-27 DIAGNOSIS — Z902 Acquired absence of lung [part of]: Secondary | ICD-10-CM | POA: Diagnosis not present

## 2022-07-27 DIAGNOSIS — J189 Pneumonia, unspecified organism: Secondary | ICD-10-CM | POA: Diagnosis not present

## 2022-07-27 DIAGNOSIS — C61 Malignant neoplasm of prostate: Secondary | ICD-10-CM | POA: Diagnosis not present

## 2022-07-27 DIAGNOSIS — M1990 Unspecified osteoarthritis, unspecified site: Secondary | ICD-10-CM | POA: Diagnosis not present

## 2022-07-27 DIAGNOSIS — C3491 Malignant neoplasm of unspecified part of right bronchus or lung: Secondary | ICD-10-CM | POA: Diagnosis not present

## 2022-07-27 DIAGNOSIS — R1312 Dysphagia, oropharyngeal phase: Secondary | ICD-10-CM | POA: Diagnosis not present

## 2022-07-27 DIAGNOSIS — F0280 Dementia in other diseases classified elsewhere without behavioral disturbance: Secondary | ICD-10-CM | POA: Diagnosis not present

## 2022-07-27 DIAGNOSIS — K219 Gastro-esophageal reflux disease without esophagitis: Secondary | ICD-10-CM | POA: Diagnosis not present

## 2022-07-27 DIAGNOSIS — I129 Hypertensive chronic kidney disease with stage 1 through stage 4 chronic kidney disease, or unspecified chronic kidney disease: Secondary | ICD-10-CM | POA: Diagnosis not present

## 2022-07-27 DIAGNOSIS — N183 Chronic kidney disease, stage 3 unspecified: Secondary | ICD-10-CM | POA: Diagnosis not present

## 2022-07-27 DIAGNOSIS — Z794 Long term (current) use of insulin: Secondary | ICD-10-CM | POA: Diagnosis not present

## 2022-07-27 DIAGNOSIS — Z7985 Long-term (current) use of injectable non-insulin antidiabetic drugs: Secondary | ICD-10-CM | POA: Diagnosis not present

## 2022-07-27 DIAGNOSIS — G20C Parkinsonism, unspecified: Secondary | ICD-10-CM | POA: Diagnosis not present

## 2022-07-29 ENCOUNTER — Ambulatory Visit (INDEPENDENT_AMBULATORY_CARE_PROVIDER_SITE_OTHER): Payer: Medicare Other | Admitting: Family Medicine

## 2022-07-29 DIAGNOSIS — J189 Pneumonia, unspecified organism: Secondary | ICD-10-CM | POA: Diagnosis not present

## 2022-07-29 DIAGNOSIS — Z0000 Encounter for general adult medical examination without abnormal findings: Secondary | ICD-10-CM

## 2022-07-29 DIAGNOSIS — F0280 Dementia in other diseases classified elsewhere without behavioral disturbance: Secondary | ICD-10-CM | POA: Diagnosis not present

## 2022-07-29 DIAGNOSIS — G20C Parkinsonism, unspecified: Secondary | ICD-10-CM | POA: Diagnosis not present

## 2022-07-29 DIAGNOSIS — E1122 Type 2 diabetes mellitus with diabetic chronic kidney disease: Secondary | ICD-10-CM | POA: Diagnosis not present

## 2022-07-29 DIAGNOSIS — I129 Hypertensive chronic kidney disease with stage 1 through stage 4 chronic kidney disease, or unspecified chronic kidney disease: Secondary | ICD-10-CM | POA: Diagnosis not present

## 2022-07-29 DIAGNOSIS — R1312 Dysphagia, oropharyngeal phase: Secondary | ICD-10-CM | POA: Diagnosis not present

## 2022-07-29 NOTE — Patient Instructions (Addendum)
[facility] Maintenance Summary and Written Plan of Care  Mr. Randall Fisher ,  Thank you for allowing me to perform your Medicare Annual Wellness Visit and for your ongoing commitment to your health.   Health Maintenance & Immunization History Health Maintenance  Topic Date Due   FOOT EXAM  07/30/2022 (Originally 07/26/2022)   COVID-19 Vaccine (4 - Moderna risk series) 10/17/2022 (Originally 10/01/2020)   Zoster Vaccines- Shingrix (1 of 2) 10/17/2022 (Originally 02/29/1976)   INFLUENZA VACCINE  12/15/2022 (Originally 04/16/2022)   Hepatitis C Screening  01/01/2023 (Originally 03/01/1975)   HEMOGLOBIN A1C  01/10/2023   Diabetic kidney evaluation - Urine ACR  03/19/2023   OPHTHALMOLOGY EXAM  06/16/2023   Diabetic kidney evaluation - GFR measurement  07/12/2023   Medicare Annual Wellness (AWV)  07/30/2023   TETANUS/TDAP  07/16/2026   COLONOSCOPY (Pts 45-48yrs Insurance coverage will need to be confirmed)  08/17/2029   Pneumonia Vaccine 47+ Years old  Completed   HPV VACCINES  Aged Out   HIV Screening  Discontinued   Immunization History  Administered Date(s) Administered   Influenza Inj Mdck Quad Pf 06/08/2020   Influenza Split 07/07/2013, 05/14/2017   Influenza, High Dose Seasonal PF 06/13/2017, 08/25/2019   Influenza, Quadrivalent, Recombinant, Inj, Pf 05/21/2016   Influenza,inj,Quad PF,6+ Mos 06/27/2015, 05/21/2016, 07/07/2018, 07/26/2021   Influenza,inj,quad, With Preservative 06/27/2015, 05/21/2016, 07/07/2018, 06/08/2020, 07/26/2021   Influenza-Unspecified 06/18/2019   Moderna Sars-Covid-2 Vaccination 12/25/2019, 01/22/2020, 08/06/2020   PNEUMOCOCCAL CONJUGATE-20 07/26/2021   Pneumococcal Polysaccharide-23 10/18/2015, 07/16/2016   Pneumococcal-Unspecified 10/18/2015   Td 09/16/1989   Tdap 07/16/2016    These are the patient goals that we discussed:  Goals Addressed               This Visit's Progress     Patient Stated (pt-stated)        Patient  stated that he would like to be able to walk.         This is a list of Health Maintenance Items that are overdue or due now: Influenza vaccine Shingrix vaccine Foot exam  Orders/Referrals Placed Today: No orders of the defined types were placed in this encounter.  (Contact our referral department at [phone] if you have not spoken with someone about your referral appointment within the next 5 days)    Follow-up Plan Follow-up with [name], DO as planned Schedule shingrix vaccine at the pharmacy. Flu can be completed at the pharmacy or the office. Patient will access AVS on my chart. Medicare wellness visit in one year.      Health Maintenance, Male Adopting a healthy lifestyle and getting preventive care are important in promoting health and wellness. Ask your health care provider about: The right schedule for you to have regular tests and exams. Things you can do on your own to prevent diseases and keep yourself healthy. What should I know about diet, weight, and exercise? Eat a healthy diet  Eat a diet that includes plenty of vegetables, fruits, low-fat dairy products, and lean protein. Do not eat a lot of foods that are high in solid fats, added sugars, or sodium. Maintain a healthy weight Body mass index (BMI) is a measurement that can be used to identify possible weight problems. It estimates body fat based on height and weight. Your health care provider can help determine your BMI and help you achieve or maintain a healthy weight. Get regular exercise Get regular exercise. This is one of the most important things you can do  for your health. Most adults should: Exercise for at least 150 minutes each week. The exercise should increase your heart rate and make you sweat (moderate-intensity exercise). Do strengthening exercises at least twice a week. This is in addition to the moderate-intensity exercise. Spend less time sitting. Even light physical activity can  be beneficial. Watch cholesterol and blood lipids Have your blood tested for lipids and cholesterol at 65 years of age, then have this test every 5 years. You may need to have your cholesterol levels checked more often if: Your lipid or cholesterol levels are high. You are older than 65 years of age. You are at high risk for heart disease. What should I know about cancer screening? Many types of cancers can be detected early and may often be prevented. Depending on your health history and family history, you may need to have cancer screening at various ages. This may include screening for: Colorectal cancer. Prostate cancer. Skin cancer. Lung cancer. What should I know about heart disease, diabetes, and high blood pressure? Blood pressure and heart disease High blood pressure causes heart disease and increases the risk of stroke. This is more likely to develop in people who have high blood pressure readings or are overweight. Talk with your health care provider about your target blood pressure readings. Have your blood pressure checked: Every 3-5 years if you are 42-62 years of age. Every year if you are 77 years old or older. If you are between the ages of 68 and 40 and are a current or former smoker, ask your health care provider if you should have a one-time screening for abdominal aortic aneurysm (AAA). Diabetes Have regular diabetes screenings. This checks your fasting blood sugar level. Have the screening done: Once every three years after age 58 if you are at a normal weight and have a low risk for diabetes. More often and at a younger age if you are overweight or have a high risk for diabetes. What should I know about preventing infection? Hepatitis B If you have a higher risk for hepatitis B, you should be screened for this virus. Talk with your health care provider to find out if you are at risk for hepatitis B infection. Hepatitis C Blood testing is recommended for: Everyone  born from [birth year] through 1964. Anyone with known risk factors for hepatitis C. Sexually transmitted infections (STIs) You should be screened each year for STIs, including gonorrhea and chlamydia, if: You are sexually active and are younger than 65 years of age. You are older than 65 years of age and your health care provider tells you that you are at risk for this type of infection. Your sexual activity has changed since you were last screened, and you are at increased risk for chlamydia or gonorrhea. Ask your health care provider if you are at risk. Ask your health care provider about whether you are at high risk for HIV. Your health care provider may recommend a prescription medicine to help prevent HIV infection. If you choose to take medicine to prevent HIV, you should first get tested for HIV. You should then be tested every 3 months for as long as you are taking the medicine. Follow these instructions at home: Alcohol use Do not drink alcohol if your health care provider tells you not to drink. If you drink alcohol: Limit how much you have to 0-2 drinks a day. Know how much alcohol is in your drink. In the [place] one drink equals one 12 oz  bottle of beer (355 mL), one 5 oz glass of wine (148 mL), or one 1 oz glass of hard liquor (44 mL). Lifestyle Do not use any products that contain nicotine or tobacco. These products include cigarettes, chewing tobacco, and vaping devices, such as e-cigarettes. If you need help quitting, ask your health care provider. Do not use street drugs. Do not share needles. Ask your health care provider for help if you need support or information about quitting drugs. General instructions Schedule regular health, dental, and eye exams. Stay current with your vaccines. Tell your health care provider if: You often feel depressed. You have ever been abused or do not feel safe at home. Summary Adopting a healthy lifestyle and getting preventive care are important  in promoting health and wellness. Follow your health care provider's instructions about healthy diet, exercising, and getting tested or screened for diseases. Follow your health care provider's instructions on monitoring your cholesterol and blood pressure. This information is not intended to replace advice given to you by your health care provider. Make sure you discuss any questions you have with your health care provider. Document Revised: 01/22/2021 Document Reviewed: 01/22/2021 Elsevier Patient Education  [place].

## 2022-07-29 NOTE — Progress Notes (Signed)
MEDICARE ANNUAL WELLNESS VISIT  07/29/2022  Telephone Visit Disclaimer This Medicare AWV was conducted by telephone due to national recommendations for restrictions regarding the COVID-19 Pandemic (e.g. social distancing).  I verified, using two identifiers, that I am speaking with Randall Fisher or their authorized healthcare agent. I discussed the limitations, risks, security, and privacy concerns of performing an evaluation and management service by telephone and the potential availability of an in-person appointment in the future. The patient expressed understanding and agreed to proceed.  Location of Patient: Home Location of Provider (nurse):  Provider home  Subjective:    Randall Fisher is a 66 y.o. male patient of Randall Nutting, DO who had a Medicare Annual Wellness Visit today via telephone. Tegh is Disabled and lives with their spouse. he has 2 children. he reports that he is socially active and does interact with friends/family regularly. he is minimally physically active and enjoys spending time with grandkids.  Patient Care Team: Randall Nutting, DO as PCP - General (Family Medicine)     07/29/2022    2:32 PM 12/01/2019    9:43 AM  Advanced Directives  Does Patient Have a Medical Advance Directive? No No  Would patient like information on creating a medical advance directive? No - Patient declined No - Patient declined    Hospital Utilization Over the Past 12 Months: # of hospitalizations or ER visits: 0 # of surgeries: 0  Review of Systems    Patient reports that his overall health is better compared to last year.  History obtained from chart review and the patient  Patient Reported Readings (BP, Pulse, CBG, Weight, etc) none  Pain Assessment Pain : No/denies pain     Current Medications & Allergies (verified) Allergies as of 07/29/2022       Reactions   Shellfish Allergy Swelling   Throat/hand swelling   Mushroom Extract Complex Swelling         Medication List        Accurate as of July 29, 2022  2:49 PM. If you have any questions, ask your nurse or doctor.          albuterol (2.5 MG/3ML) 0.083% nebulizer solution Commonly known as: PROVENTIL Take 3 mLs (2.5 mg total) by nebulization every 6 (six) hours as needed for wheezing or shortness of breath.   AMBULATORY NON FORMULARY MEDICATION Please provide lightweight transport chair.  Patient unable to propel himself in standard manual wheelchair.  Wife has trouble pushing standard wheelchair and moving in/out vehicle.  Diagnosis: Parkinsonism- G20, Gait Abnormality-R26.9   BD Pen Needle Nano U/F 32G X 4 MM Misc Generic drug: Insulin Pen Needle USE WITH LEVEMIR FLEXTOUCH DAILY   budesonide 0.5 MG/2ML nebulizer solution Commonly known as: PULMICORT Take 0.5 mg by nebulization daily as needed.   carbidopa-levodopa 25-100 MG tablet Commonly known as: SINEMET IR Take 2 tablets by mouth 3 (three) times daily.   clotrimazole-betamethasone cream Commonly known as: LOTRISONE Apply 1 Application topically daily.   donepezil 5 MG tablet Commonly known as: ARICEPT Take 1 tablet (5 mg total) by mouth at bedtime.   dorzolamide-timolol 2-0.5 % ophthalmic solution Commonly known as: COSOPT Place 1 drop into the right eye 2 (two) times daily.   doxycycline 100 MG tablet Commonly known as: VIBRA-TABS Take 1 tablet (100 mg total) by mouth 2 (two) times daily.   fenofibrate 160 MG tablet Take 1 tablet (160 mg total) by mouth daily.   fexofenadine-pseudoephedrine 60-120 MG 12 hr tablet  Commonly known as: ALLEGRA-D Take 1 tablet by mouth daily as needed.   Flovent HFA 44 MCG/ACT inhaler Generic drug: fluticasone Inhale 2 puffs into the lungs in the morning and at bedtime.   fluticasone 50 MCG/ACT nasal spray Commonly known as: Flonase Place 2 sprays into both nostrils daily.   furosemide 20 MG tablet Commonly known as: LASIX TAKE 1 TABLET BY MOUTH DAILY    latanoprost 0.005 % ophthalmic solution Commonly known as: XALATAN Place 1 drop into both eyes at bedtime.   Misc. Devices Misc DX: Severe sleep apnea Start bipap 16/12 cm. Water with mask and supplies   MULTIVITAMIN ADULT (MINERALS) PO Take 1 tablet by mouth.   NONFORMULARY OR COMPOUNDED ITEM Please provide rolling walker.  Diagnosis: Gait instability   nystatin-triamcinolone cream Commonly known as: MYCOLOG II APPLY TO AFFECTED AREA(S)  TOPICALLY TWICE DAILY   omeprazole 20 MG capsule Commonly known as: PRILOSEC TAKE 1 CAPSULE BY MOUTH DAILY   predniSONE 50 MG tablet Commonly known as: DELTASONE Take 1 tab po daily x5 days.   pregabalin 200 MG capsule Commonly known as: LYRICA Take 200 mg by mouth 2 (two) times daily.   PROBIOTIC PO Take 1 tablet by mouth daily.   rosuvastatin 10 MG tablet Commonly known as: CRESTOR TAKE 1 TABLET BY MOUTH DAILY   sertraline 50 MG tablet Commonly known as: ZOLOFT Take 50 mg by mouth daily.   tadalafil 20 MG tablet Commonly known as: CIALIS Take 0.5-1 tablets (10-20 mg total) by mouth every other day as needed (ED). Replaces sildenafil as tadalafil recently approved.   tamsulosin 0.4 MG Caps capsule Commonly known as: FLOMAX TAKE 1 CAPSULE BY MOUTH DAILY   timolol 0.5 % ophthalmic solution Commonly known as: TIMOPTIC Place 1 drop into the right eye every morning.   traMADol 50 MG tablet Commonly known as: ULTRAM Take 1 tablet (50 mg total) by mouth every 6 (six) hours as needed.   Tyler Aas FlexTouch 200 UNIT/ML FlexTouch Pen Generic drug: insulin degludec Inject 120 Units into the skin.   Trulicity 1.5 ZO/1.0RU Sopn Generic drug: Dulaglutide INJECT 1.5 MG(0.5ML) UNDER THE SKIN ONCE A WEEK.   valACYclovir 500 MG tablet Commonly known as: VALTREX Take 500 mg by mouth daily.   Wixela Inhub 250-50 MCG/ACT Aepb Generic drug: fluticasone-salmeterol USE 1 INHALATION BY MOUTH IN THE MORNING AND AT BEDTIME         History (reviewed): Past Medical History:  Diagnosis Date   Anxiety October 2020   Back pain    Depression October 2020   Diabetes mellitus without complication ([place])    GERD (gastroesophageal reflux disease) Unknown   Glaucoma January 2021   Hyperlipidemia    Joint pain    Kidney disease    Leg swelling    Lung cancer (HCC)    Memory loss    Neuropathy    Numbness and tingling in both hands    Parkinson's disease 2021   S/P lobectomy of lung 2013   RUL for NSCLC   Sleep apnea Unknown   Past Surgical History:  Procedure Laterality Date   CARPAL TUNNEL RELEASE Bilateral    LUNG LOBECTOMY Right 2013   TONSILLECTOMY     Family History  Problem Relation Age of Onset   Diabetes Mother    Cancer Mother    Kidney failure Father    Kidney disease Father    Social History   Socioeconomic History   Marital status: Married    Spouse name: [name]  Number of children: 2   Years of education: 14   Highest education level: Some college, no degree  Occupational History   Occupation: unemployed - laid off   Occupation: seeking disability   Occupation: disabled  Tobacco Use   Smoking status: Former    Packs/day: 1.50    Years: 40.00    Total pack years: 60.00    Types: Cigarettes    Quit date: 11/15/2011    Years since quitting: 10.7   Smokeless tobacco: Never  Vaping Use   Vaping Use: Former  Substance and Sexual Activity   Alcohol use: Not Currently    Comment: ocassionally   Drug use: Never   Sexual activity: Not Currently    Partners: Female    Birth control/protection: Condom  Other Topics Concern   Not on file  Social History Narrative   Lives with his wife. Spending time with grandkids.      Social Determinants of Health   Financial Resource Strain: Low Risk  (07/29/2022)   Overall Financial Resource Strain (CARDIA)    Difficulty of Paying Living Expenses: Not hard at all  Food Insecurity: No Food Insecurity (07/29/2022)   Hunger Vital Sign     Worried About Running Out of Food in the Last Year: Never true    Ran Out of Food in the Last Year: Never true  Transportation Needs: No Transportation Needs (07/29/2022)   PRAPARE - Hydrologist (Medical): No    Lack of Transportation (Non-Medical): No  Physical Activity: Insufficiently Active (07/29/2022)   Exercise Vital Sign    Days of Exercise per Week: 2 days    Minutes of Exercise per Session: 50 min  Stress: No Stress Concern Present (07/29/2022)   [place]    Feeling of Stress : Not at all  Social Connections: Moderately Isolated (07/29/2022)   Social Connection and Isolation Panel [NHANES]    Frequency of Communication with Friends and Family: More than three times a week    Frequency of Social Gatherings with Friends and Family: Once a week    Attends Religious Services: Never    Marine scientist or Organizations: No    Attends Archivist Meetings: Never    Marital Status: Married    Activities of Daily Living    07/29/2022    2:39 PM  In your present state of health, do you have any difficulty performing the following activities:  Hearing? 0  Vision? 1  Comment glaucoma in right eye.  Difficulty concentrating or making decisions? 0  Walking or climbing stairs? 1  Comment has parkinson's disease  Dressing or bathing? 0  Doing errands, shopping? 0  Preparing Food and eating ? N  Using the Toilet? N  In the past six months, have you accidently leaked urine? Y  Do you have problems with loss of bowel control? N  Managing your Medications? N  Managing your Finances? N  Housekeeping or managing your Housekeeping? N    Patient Education/ Literacy How often do you need to have someone help you when you read instructions, pamphlets, or other written materials from your doctor or pharmacy?: 1 - Never What is the last grade level you completed in school?:  some college  Exercise Current Exercise Habits: Home exercise routine, Type of exercise: strength training/weights, Time (Minutes): 45, Frequency (Times/Week): 2, Weekly Exercise (Minutes/Week): 90, Intensity: Moderate, Exercise limited by: Other - see comments;neurologic condition(s) (Physical  therapy)  Diet Patient reports consuming 2 meals a day and 2 snack(s) a day Patient rep0orts that his primary diet is: Regular Patient reports that she does have regular access to food.   Depression Screen    07/29/2022    2:37 PM 07/11/2022    3:40 PM 11/14/2021   11:14 AM 04/20/2021   10:39 AM 12/01/2019    9:44 AM  PHQ 2/9 Scores  PHQ - 2 Score 0 1 1 2 2   PHQ- 9 Score    6 10     Fall Risk    07/29/2022    2:37 PM 07/11/2022    3:40 PM 11/14/2021   11:14 AM 04/20/2021   10:38 AM  Fall Risk   Falls in the past year? 1 1 1 1   Number falls in past yr: 1 1 1 1   Injury with Fall? 0 1 1 1   Risk for fall due to : History of fall(s);Impaired mobility;Impaired balance/gait Impaired balance/gait;Impaired mobility History of fall(s);Impaired balance/gait;Impaired mobility History of fall(s)  Follow up Falls evaluation completed;Education provided;Falls prevention discussed Falls evaluation completed;Falls prevention discussed  Falls evaluation completed     Objective:  Randall Fisher seemed alert and oriented and he participated appropriately during our telephone visit.  Blood Pressure Weight BMI  BP Readings from Last 3 Encounters:  07/11/22 100/60  03/13/22 (!) 123/50  12/31/21 127/75   Wt Readings from Last 3 Encounters:  07/11/22 245 lb (111.1 kg)  06/05/22 245 lb (111.1 kg)  03/13/22 245 lb (111.1 kg)   BMI Readings from Last 1 Encounters:  07/11/22 36.18 kg/m    *Unable to obtain current vital signs, weight, and BMI due to telephone visit type  Hearing/Vision  Rishith did not seem to have difficulty with hearing/understanding during the telephone conversation Reports that he has  had a formal eye exam by an eye care professional within the past year Reports that he has not had a formal hearing evaluation within the past year *Unable to fully assess hearing and vision during telephone visit type  Cognitive Function:    07/29/2022    2:41 PM  6CIT Screen  What Year? 0 points  What month? 0 points  What time? 0 points  Count back from 20 0 points  Months in reverse 0 points  Repeat phrase 0 points  Total Score 0 points   (Normal:0-7, Significant for Dysfunction: >8)  Normal Cognitive Function Screening: Yes   Immunization & Health Maintenance Record Immunization History  Administered Date(s) Administered   Influenza Inj Mdck Quad Pf 06/08/2020   Influenza Split 07/07/2013, 05/14/2017   Influenza, High Dose Seasonal PF 06/13/2017, 08/25/2019   Influenza, Quadrivalent, Recombinant, Inj, Pf 05/21/2016   Influenza,inj,Quad PF,6+ Mos 06/27/2015, 05/21/2016, 07/07/2018, 07/26/2021   Influenza,inj,quad, With Preservative 06/27/2015, 05/21/2016, 07/07/2018, 06/08/2020, 07/26/2021   Influenza-Unspecified 06/18/2019   Moderna Sars-Covid-2 Vaccination 12/25/2019, 01/22/2020, 08/06/2020   PNEUMOCOCCAL CONJUGATE-20 07/26/2021   Pneumococcal Polysaccharide-23 10/18/2015, 07/16/2016   Pneumococcal-Unspecified 10/18/2015   Td 09/16/1989   Tdap 07/16/2016    Health Maintenance  Topic Date Due   FOOT EXAM  07/30/2022 (Originally 07/26/2022)   COVID-19 Vaccine (4 - Moderna risk series) 10/17/2022 (Originally 10/01/2020)   Zoster Vaccines- Shingrix (1 of 2) 10/17/2022 (Originally 02/29/1975)   INFLUENZA VACCINE  12/15/2022 (Originally 04/16/2022)   Hepatitis C Screening  01/01/2023 (Originally 03/01/1974)   HEMOGLOBIN A1C  01/10/2023   Diabetic kidney evaluation - Urine ACR  03/19/2023   OPHTHALMOLOGY EXAM  06/16/2023   Diabetic kidney  evaluation - GFR measurement  07/12/2023   Medicare Annual Wellness (AWV)  07/30/2023   TETANUS/TDAP  07/16/2026   COLONOSCOPY (Pts  45-43yrs Insurance coverage will need to be confirmed)  08/17/2029   Pneumonia Vaccine 39+ Years old  Completed   HPV VACCINES  Aged Out   HIV Screening  Discontinued       Assessment  This is a routine wellness examination for [employer].  Health Maintenance: Due or Overdue There are no preventive care reminders to display for this patient.   Randall Fisher does not need a referral for Community Assistance: Care Management:   no Social Work:    no Prescription Assistance:  no Nutrition/Diabetes Education:  no   Plan:  Personalized Goals  Goals Addressed               This Visit's Progress     Patient Stated (pt-stated)        Patient stated that he would like to be able to walk.       Personalized Health Maintenance & Screening Recommendations  Influenza vaccine Shingrix vaccine Foot exam  Lung Cancer Screening Recommended: patient has one every year.  (Low Dose CT Chest recommended if Age 79-80 years, 30 pack-year currently smoking OR have quit w/in past 15 years) Hepatitis C Screening recommended: yes HIV Screening recommended: no  Advanced Directives: Written information was not prepared per patient's request.  Referrals & Orders No orders of the defined types were placed in this encounter.   Follow-up Plan Follow-up with Randall Nutting, DO as planned Schedule shingrix vaccine at the pharmacy. Flu can be completed at the pharmacy or the office. Patient will access AVS on my chart. Medicare wellness visit in one year.    I have personally reviewed and noted the following in the patient's chart:   Medical and social history Use of alcohol, tobacco or illicit drugs  Current medications and supplements Functional ability and status Nutritional status Physical activity Advanced directives List of other physicians Hospitalizations, surgeries, and ER visits in previous 12 months Vitals Screenings to include cognitive, depression, and  falls Referrals and appointments  In addition, I have reviewed and discussed with Randall Fisher certain preventive protocols, quality metrics, and best practice recommendations. A written personalized care plan for preventive services as well as general preventive health recommendations is available and can be mailed to the patient at his request.      Tinnie Gens, RN BSN  07/29/2022

## 2022-07-31 ENCOUNTER — Telehealth: Payer: Self-pay

## 2022-07-31 NOTE — Telephone Encounter (Signed)
Received call from [facility] @ [facility] reporting 2 falls for Mr. Tegeler. No injuries.   Dr. Zigmund Daniel has recommended that Mr. Maraj and family consider Assisted Living facilities. He's available for FL2 completion.

## 2022-08-01 DIAGNOSIS — R1312 Dysphagia, oropharyngeal phase: Secondary | ICD-10-CM | POA: Diagnosis not present

## 2022-08-01 DIAGNOSIS — E1122 Type 2 diabetes mellitus with diabetic chronic kidney disease: Secondary | ICD-10-CM | POA: Diagnosis not present

## 2022-08-01 DIAGNOSIS — G20C Parkinsonism, unspecified: Secondary | ICD-10-CM | POA: Diagnosis not present

## 2022-08-01 DIAGNOSIS — F0280 Dementia in other diseases classified elsewhere without behavioral disturbance: Secondary | ICD-10-CM | POA: Diagnosis not present

## 2022-08-01 DIAGNOSIS — I129 Hypertensive chronic kidney disease with stage 1 through stage 4 chronic kidney disease, or unspecified chronic kidney disease: Secondary | ICD-10-CM | POA: Diagnosis not present

## 2022-08-01 DIAGNOSIS — J189 Pneumonia, unspecified organism: Secondary | ICD-10-CM | POA: Diagnosis not present

## 2022-08-02 ENCOUNTER — Telehealth (INDEPENDENT_AMBULATORY_CARE_PROVIDER_SITE_OTHER): Payer: Medicare Other | Admitting: Family Medicine

## 2022-08-02 ENCOUNTER — Encounter: Payer: Self-pay | Admitting: Family Medicine

## 2022-08-02 ENCOUNTER — Telehealth: Payer: Self-pay

## 2022-08-02 DIAGNOSIS — J40 Bronchitis, not specified as acute or chronic: Secondary | ICD-10-CM

## 2022-08-02 DIAGNOSIS — J329 Chronic sinusitis, unspecified: Secondary | ICD-10-CM

## 2022-08-02 MED ORDER — DOXYCYCLINE HYCLATE 100 MG PO TABS: 100.0000 mg | ORAL_TABLET | Freq: Two times a day (BID) | ORAL | 0 refills | Status: DC

## 2022-08-02 MED ORDER — PREDNISONE 50 MG PO TABS: ORAL_TABLET | ORAL | 0 refills | Status: DC

## 2022-08-02 NOTE — Telephone Encounter (Signed)
Randall Fisher @ [facility] [phone]) lvm advising of fall on Wednesday. Per Randall Fisher, he was turning to go into the bathroom and fell to his left side. Spouse was there to help lift him up. No reported injury.   PT states he heard "rubbing in the upper left lobe". Pt coughing up phlegm and small gurgling when walking.

## 2022-08-05 ENCOUNTER — Encounter: Payer: Self-pay | Admitting: Family Medicine

## 2022-08-05 NOTE — Progress Notes (Signed)
Randall Fisher - 66 y.o. male MRN [id number]  Date of birth: 05-28-1956   This visit type was conducted due to national recommendations for restrictions regarding the COVID-19 Pandemic (e.g. social distancing).  This format is felt to be most appropriate for this patient at this time.  All issues noted in this document were discussed and addressed.  No physical exam was performed (except for noted visual exam findings with Video Visits).  I discussed the limitations of evaluation and management by telemedicine and the availability of in person appointments. The patient expressed understanding and agreed to proceed.  I connected withNAME@ on 08/05/22 at 11:30 AM EST by a video enabled telemedicine application and verified that I am speaking with the correct person using two identifiers.  Present at visit: Randall Nutting, DO Randall Fisher   Patient Location: Home [address] [place] [address]   Provider location:   [facility]  Chief Complaint  Patient presents with   Chest Pain    Runny nose and cough    HPI  Randall Fisher is a 66 y.o. male who presents via audio/video conferencing for a telehealth visit today.  Reports that he has had increased sinus pain and pressure, yellow purulent nasal discharge, shortness of breath, wheezing, headache.  Denies nausea, vomiting, diarrhea.  No difficulty breathing.  Has not had fever.  Has not checked for COVID at home.  Wife with similar symptoms and tested negative for COVID.     ROS:  A comprehensive ROS was completed and negative except as noted per HPI  Past Medical History:  Diagnosis Date   Anxiety October 2020   Back pain    Depression October 2020   Diabetes mellitus without complication ([place])    GERD (gastroesophageal reflux disease) Unknown   Glaucoma January 2021   Hyperlipidemia    Joint pain    Kidney disease    Leg swelling    Lung cancer (HCC)    Memory loss    Neuropathy    Numbness and tingling in both  hands    Parkinson's disease 2021   S/P lobectomy of lung 2013   RUL for NSCLC   Sleep apnea Unknown    Past Surgical History:  Procedure Laterality Date   CARPAL TUNNEL RELEASE Bilateral    LUNG LOBECTOMY Right 2013   TONSILLECTOMY      Family History  Problem Relation Age of Onset   Diabetes Mother    Cancer Mother    Kidney failure Father    Kidney disease Father     Social History   Socioeconomic History   Marital status: Married    Spouse name: [name]   Number of children: 2   Years of education: 14   Highest education level: Some college, no degree  Occupational History   Occupation: unemployed - laid off   Occupation: seeking disability   Occupation: disabled  Tobacco Use   Smoking status: Former    Packs/day: 1.50    Years: 40.00    Total pack years: 60.00    Types: Cigarettes    Quit date: 11/15/2011    Years since quitting: 10.7   Smokeless tobacco: Never  Vaping Use   Vaping Use: Former  Substance and Sexual Activity   Alcohol use: Not Currently    Comment: ocassionally   Drug use: Never   Sexual activity: Not Currently    Partners: Female    Birth control/protection: Condom  Other Topics Concern   Not on  file  Social History Narrative   Lives with his wife. Spending time with grandkids.      Social Determinants of Health   Financial Resource Strain: Low Risk  (07/29/2022)   Overall Financial Resource Strain (CARDIA)    Difficulty of Paying Living Expenses: Not hard at all  Food Insecurity: No Food Insecurity (07/29/2022)   Hunger Vital Sign    Worried About Running Out of Food in the Last Year: Never true    Ran Out of Food in the Last Year: Never true  Transportation Needs: No Transportation Needs (07/29/2022)   PRAPARE - Hydrologist (Medical): No    Lack of Transportation (Non-Medical): No  Physical Activity: Insufficiently Active (07/29/2022)   Exercise Vital Sign    Days of Exercise per Week: 2 days     Minutes of Exercise per Session: 50 min  Stress: No Stress Concern Present (07/29/2022)   [place]    Feeling of Stress : Not at all  Social Connections: Moderately Isolated (07/29/2022)   Social Connection and Isolation Panel [NHANES]    Frequency of Communication with Friends and Family: More than three times a week    Frequency of Social Gatherings with Friends and Family: Once a week    Attends Religious Services: Never    Marine scientist or Organizations: No    Attends Archivist Meetings: Never    Marital Status: Married  Human resources officer Violence: Not At Risk (07/29/2022)   Humiliation, Afraid, Rape, and Kick questionnaire    Fear of Current or Ex-Partner: No    Emotionally Abused: No    Physically Abused: No    Sexually Abused: No     Current Outpatient Medications:    albuterol (PROVENTIL) (2.5 MG/3ML) 0.083% nebulizer solution, Take 3 mLs (2.5 mg total) by nebulization every 6 (six) hours as needed for wheezing or shortness of breath., Disp: 150 mL, Rfl: 1   AMBULATORY NON FORMULARY MEDICATION, Please provide lightweight transport chair.  Patient unable to propel himself in standard manual wheelchair.  Wife has trouble pushing standard wheelchair and moving in/out vehicle.  Diagnosis: Parkinsonism- G20, Gait Abnormality-R26.9, Disp: 1 Device, Rfl: 0   budesonide (PULMICORT) 0.5 MG/2ML nebulizer solution, Take 0.5 mg by nebulization daily as needed., Disp: 100 mL, Rfl: 10   carbidopa-levodopa (SINEMET IR) 25-100 MG tablet, Take 2 tablets by mouth 3 (three) times daily., Disp: 540 tablet, Rfl: 3   clotrimazole-betamethasone (LOTRISONE) cream, Apply 1 Application topically daily., Disp: 45 g, Rfl: 3   donepezil (ARICEPT) 5 MG tablet, Take 1 tablet (5 mg total) by mouth at bedtime., Disp: 90 tablet, Rfl: 1   dorzolamide-timolol (COSOPT) 2-0.5 % ophthalmic solution, Place 1 drop into the right eye 2  (two) times daily., Disp: , Rfl:    Dulaglutide (TRULICITY) 1.5 ZW/2.5EN SOPN, INJECT 1.5 MG(0.5ML) UNDER THE SKIN ONCE A WEEK., Disp: , Rfl:    fenofibrate 160 MG tablet, Take 1 tablet (160 mg total) by mouth daily., Disp: 90 tablet, Rfl: 1   fexofenadine-pseudoephedrine (ALLEGRA-D) 60-120 MG 12 hr tablet, Take 1 tablet by mouth daily as needed., Disp: , Rfl:    fluticasone (FLONASE) 50 MCG/ACT nasal spray, Place 2 sprays into both nostrils daily., Disp: 1 g, Rfl: 0   fluticasone (FLOVENT HFA) 44 MCG/ACT inhaler, Inhale 2 puffs into the lungs in the morning and at bedtime., Disp: 1 each, Rfl: 12   furosemide (LASIX) 20 MG  tablet, TAKE 1 TABLET BY MOUTH DAILY, Disp: 90 tablet, Rfl: 3   insulin degludec (TRESIBA FLEXTOUCH) 200 UNIT/ML FlexTouch Pen, Inject 120 Units into the skin. , Disp: , Rfl:    Insulin Pen Needle (BD PEN NEEDLE NANO U/F) 32G X 4 MM MISC, USE WITH LEVEMIR FLEXTOUCH DAILY, Disp: 200 each, Rfl: 2   latanoprost (XALATAN) 0.005 % ophthalmic solution, Place 1 drop into both eyes at bedtime., Disp: , Rfl:    Misc. Devices MISC, DX: Severe sleep apnea Start bipap 16/12 cm. Water with mask and supplies, Disp: , Rfl:    Multiple Vitamins-Minerals (MULTIVITAMIN ADULT, MINERALS, PO), Take 1 tablet by mouth., Disp: , Rfl:    NONFORMULARY OR COMPOUNDED ITEM, Please provide rolling walker.  Diagnosis: Gait instability, Disp: 1 each, Rfl: 0   nystatin-triamcinolone (MYCOLOG II) cream, APPLY TO AFFECTED AREA(S)  TOPICALLY TWICE DAILY, Disp: 30 g, Rfl: 1   omeprazole (PRILOSEC) 20 MG capsule, TAKE 1 CAPSULE BY MOUTH DAILY, Disp: 90 capsule, Rfl: 3   predniSONE (DELTASONE) 50 MG tablet, Take 1 tab po daily x5 days., Disp: 5 tablet, Rfl: 0   predniSONE (DELTASONE) 50 MG tablet, Take 1 tab po daily x5 days., Disp: 5 tablet, Rfl: 0   pregabalin (LYRICA) 200 MG capsule, Take 200 mg by mouth 2 (two) times daily. , Disp: , Rfl:    Probiotic Product (PROBIOTIC PO), Take 1 tablet by mouth daily., Disp: ,  Rfl:    rosuvastatin (CRESTOR) 10 MG tablet, TAKE 1 TABLET BY MOUTH DAILY, Disp: 90 tablet, Rfl: 3   sertraline (ZOLOFT) 50 MG tablet, Take 50 mg by mouth daily., Disp: , Rfl:    tadalafil (CIALIS) 20 MG tablet, Take 0.5-1 tablets (10-20 mg total) by mouth every other day as needed (ED). Replaces sildenafil as tadalafil recently approved., Disp: 45 tablet, Rfl: 3   tamsulosin (FLOMAX) 0.4 MG CAPS capsule, TAKE 1 CAPSULE BY MOUTH DAILY, Disp: 90 capsule, Rfl: 3   timolol (TIMOPTIC) 0.5 % ophthalmic solution, Place 1 drop into the right eye every morning., Disp: , Rfl:    traMADol (ULTRAM) 50 MG tablet, Take 1 tablet (50 mg total) by mouth every 6 (six) hours as needed., Disp: 40 tablet, Rfl: 1   valACYclovir (VALTREX) 500 MG tablet, Take 500 mg by mouth daily., Disp: , Rfl:    WIXELA INHUB 250-50 MCG/ACT AEPB, USE 1 INHALATION BY MOUTH IN THE MORNING AND AT BEDTIME, Disp: 180 each, Rfl: 3   doxycycline (VIBRA-TABS) 100 MG tablet, Take 1 tablet (100 mg total) by mouth 2 (two) times daily., Disp: 20 tablet, Rfl: 0  EXAM:  VITALS per patient if applicable: There were no vitals taken for this visit.  GENERAL: alert, oriented, appears well and in no acute distress  HEENT: atraumatic, conjunttiva clear, no obvious abnormalities on inspection of external nose and ears  NECK: normal movements of the head and neck  LUNGS: on inspection no signs of respiratory distress, breathing rate appears normal, no obvious gross SOB, gasping or wheezing  CV: no obvious cyanosis  MS: moves all visible extremities without noticeable abnormality  PSYCH/NEURO: pleasant and cooperative, no obvious depression or anxiety, speech and thought processing grossly intact  ASSESSMENT AND PLAN:  Discussed the following assessment and plan:  Sinobronchitis Treated with course of doxycycline and prednisone.  Instructed to contact clinic if symptoms continue to worsen or not improving as expected with treatment.     I  discussed the assessment and treatment plan with the patient. The  patient was provided an opportunity to ask questions and all were answered. The patient agreed with the plan and demonstrated an understanding of the instructions.   The patient was advised to call back or seek an in-person evaluation if the symptoms worsen or if the condition fails to improve as anticipated.    Randall Nutting, DO

## 2022-08-05 NOTE — Assessment & Plan Note (Signed)
Treated with course of doxycycline and prednisone.  Instructed to contact clinic if symptoms continue to worsen or not improving as expected with treatment.

## 2022-08-06 DIAGNOSIS — G20C Parkinsonism, unspecified: Secondary | ICD-10-CM | POA: Diagnosis not present

## 2022-08-06 DIAGNOSIS — J189 Pneumonia, unspecified organism: Secondary | ICD-10-CM | POA: Diagnosis not present

## 2022-08-06 DIAGNOSIS — R1312 Dysphagia, oropharyngeal phase: Secondary | ICD-10-CM | POA: Diagnosis not present

## 2022-08-06 DIAGNOSIS — I129 Hypertensive chronic kidney disease with stage 1 through stage 4 chronic kidney disease, or unspecified chronic kidney disease: Secondary | ICD-10-CM | POA: Diagnosis not present

## 2022-08-06 DIAGNOSIS — E1122 Type 2 diabetes mellitus with diabetic chronic kidney disease: Secondary | ICD-10-CM | POA: Diagnosis not present

## 2022-08-06 DIAGNOSIS — F0280 Dementia in other diseases classified elsewhere without behavioral disturbance: Secondary | ICD-10-CM | POA: Diagnosis not present

## 2022-08-15 DIAGNOSIS — R1312 Dysphagia, oropharyngeal phase: Secondary | ICD-10-CM | POA: Diagnosis not present

## 2022-08-15 DIAGNOSIS — E1122 Type 2 diabetes mellitus with diabetic chronic kidney disease: Secondary | ICD-10-CM | POA: Diagnosis not present

## 2022-08-15 DIAGNOSIS — G20C Parkinsonism, unspecified: Secondary | ICD-10-CM | POA: Diagnosis not present

## 2022-08-15 DIAGNOSIS — J189 Pneumonia, unspecified organism: Secondary | ICD-10-CM | POA: Diagnosis not present

## 2022-08-15 DIAGNOSIS — F0280 Dementia in other diseases classified elsewhere without behavioral disturbance: Secondary | ICD-10-CM | POA: Diagnosis not present

## 2022-08-15 DIAGNOSIS — I129 Hypertensive chronic kidney disease with stage 1 through stage 4 chronic kidney disease, or unspecified chronic kidney disease: Secondary | ICD-10-CM | POA: Diagnosis not present

## 2022-08-19 ENCOUNTER — Telehealth: Payer: Self-pay

## 2022-08-19 DIAGNOSIS — G20C Parkinsonism, unspecified: Secondary | ICD-10-CM | POA: Diagnosis not present

## 2022-08-19 DIAGNOSIS — N3001 Acute cystitis with hematuria: Secondary | ICD-10-CM | POA: Diagnosis not present

## 2022-08-19 DIAGNOSIS — E1122 Type 2 diabetes mellitus with diabetic chronic kidney disease: Secondary | ICD-10-CM | POA: Diagnosis not present

## 2022-08-19 DIAGNOSIS — N3000 Acute cystitis without hematuria: Secondary | ICD-10-CM | POA: Diagnosis not present

## 2022-08-19 DIAGNOSIS — K4090 Unilateral inguinal hernia, without obstruction or gangrene, not specified as recurrent: Secondary | ICD-10-CM | POA: Diagnosis not present

## 2022-08-19 DIAGNOSIS — N179 Acute kidney failure, unspecified: Secondary | ICD-10-CM | POA: Diagnosis not present

## 2022-08-19 DIAGNOSIS — F0280 Dementia in other diseases classified elsewhere without behavioral disturbance: Secondary | ICD-10-CM | POA: Diagnosis not present

## 2022-08-19 DIAGNOSIS — G20A1 Parkinson's disease without dyskinesia, without mention of fluctuations: Secondary | ICD-10-CM | POA: Diagnosis not present

## 2022-08-19 DIAGNOSIS — N183 Chronic kidney disease, stage 3 unspecified: Secondary | ICD-10-CM | POA: Diagnosis not present

## 2022-08-19 DIAGNOSIS — N3289 Other specified disorders of bladder: Secondary | ICD-10-CM | POA: Diagnosis not present

## 2022-08-19 DIAGNOSIS — R531 Weakness: Secondary | ICD-10-CM | POA: Diagnosis not present

## 2022-08-19 DIAGNOSIS — R1312 Dysphagia, oropharyngeal phase: Secondary | ICD-10-CM | POA: Diagnosis not present

## 2022-08-19 DIAGNOSIS — Z743 Need for continuous supervision: Secondary | ICD-10-CM | POA: Diagnosis not present

## 2022-08-19 DIAGNOSIS — J168 Pneumonia due to other specified infectious organisms: Secondary | ICD-10-CM | POA: Diagnosis not present

## 2022-08-19 DIAGNOSIS — R4182 Altered mental status, unspecified: Secondary | ICD-10-CM | POA: Diagnosis not present

## 2022-08-19 DIAGNOSIS — J189 Pneumonia, unspecified organism: Secondary | ICD-10-CM | POA: Diagnosis not present

## 2022-08-19 DIAGNOSIS — Z794 Long term (current) use of insulin: Secondary | ICD-10-CM | POA: Diagnosis not present

## 2022-08-19 DIAGNOSIS — R918 Other nonspecific abnormal finding of lung field: Secondary | ICD-10-CM | POA: Diagnosis not present

## 2022-08-19 DIAGNOSIS — N281 Cyst of kidney, acquired: Secondary | ICD-10-CM | POA: Diagnosis not present

## 2022-08-19 DIAGNOSIS — I129 Hypertensive chronic kidney disease with stage 1 through stage 4 chronic kidney disease, or unspecified chronic kidney disease: Secondary | ICD-10-CM | POA: Diagnosis not present

## 2022-08-19 DIAGNOSIS — R509 Fever, unspecified: Secondary | ICD-10-CM | POA: Diagnosis not present

## 2022-08-19 NOTE — Telephone Encounter (Signed)
[facility] nurse called to report that patient was sent to the ER with a low grade fever and lethargy. While waiting for the ambulance, he fell with no reported injuries.

## 2022-08-19 NOTE — Telephone Encounter (Signed)
Noted  

## 2022-08-20 DIAGNOSIS — G8929 Other chronic pain: Secondary | ICD-10-CM | POA: Diagnosis present

## 2022-08-20 DIAGNOSIS — F0280 Dementia in other diseases classified elsewhere without behavioral disturbance: Secondary | ICD-10-CM | POA: Diagnosis present

## 2022-08-20 DIAGNOSIS — N400 Enlarged prostate without lower urinary tract symptoms: Secondary | ICD-10-CM | POA: Diagnosis present

## 2022-08-20 DIAGNOSIS — N179 Acute kidney failure, unspecified: Secondary | ICD-10-CM | POA: Diagnosis present

## 2022-08-20 DIAGNOSIS — R4182 Altered mental status, unspecified: Secondary | ICD-10-CM | POA: Diagnosis present

## 2022-08-20 DIAGNOSIS — B952 Enterococcus as the cause of diseases classified elsewhere: Secondary | ICD-10-CM | POA: Diagnosis present

## 2022-08-20 DIAGNOSIS — T380X5A Adverse effect of glucocorticoids and synthetic analogues, initial encounter: Secondary | ICD-10-CM | POA: Diagnosis present

## 2022-08-20 DIAGNOSIS — M545 Low back pain, unspecified: Secondary | ICD-10-CM | POA: Diagnosis present

## 2022-08-20 DIAGNOSIS — N183 Chronic kidney disease, stage 3 unspecified: Secondary | ICD-10-CM | POA: Diagnosis present

## 2022-08-20 DIAGNOSIS — R531 Weakness: Secondary | ICD-10-CM | POA: Diagnosis not present

## 2022-08-20 DIAGNOSIS — J168 Pneumonia due to other specified infectious organisms: Secondary | ICD-10-CM | POA: Diagnosis present

## 2022-08-20 DIAGNOSIS — G20A1 Parkinson's disease without dyskinesia, without mention of fluctuations: Secondary | ICD-10-CM | POA: Diagnosis present

## 2022-08-20 DIAGNOSIS — K219 Gastro-esophageal reflux disease without esophagitis: Secondary | ICD-10-CM | POA: Diagnosis present

## 2022-08-20 DIAGNOSIS — Z1152 Encounter for screening for COVID-19: Secondary | ICD-10-CM | POA: Diagnosis not present

## 2022-08-20 DIAGNOSIS — N3001 Acute cystitis with hematuria: Secondary | ICD-10-CM | POA: Diagnosis present

## 2022-08-20 DIAGNOSIS — D631 Anemia in chronic kidney disease: Secondary | ICD-10-CM | POA: Diagnosis present

## 2022-08-20 DIAGNOSIS — E782 Mixed hyperlipidemia: Secondary | ICD-10-CM | POA: Diagnosis present

## 2022-08-20 DIAGNOSIS — I129 Hypertensive chronic kidney disease with stage 1 through stage 4 chronic kidney disease, or unspecified chronic kidney disease: Secondary | ICD-10-CM | POA: Diagnosis present

## 2022-08-20 DIAGNOSIS — J189 Pneumonia, unspecified organism: Secondary | ICD-10-CM | POA: Diagnosis not present

## 2022-08-20 DIAGNOSIS — F419 Anxiety disorder, unspecified: Secondary | ICD-10-CM | POA: Diagnosis present

## 2022-08-20 DIAGNOSIS — D509 Iron deficiency anemia, unspecified: Secondary | ICD-10-CM | POA: Diagnosis present

## 2022-08-20 DIAGNOSIS — E0965 Drug or chemical induced diabetes mellitus with hyperglycemia: Secondary | ICD-10-CM | POA: Diagnosis present

## 2022-08-20 DIAGNOSIS — M1990 Unspecified osteoarthritis, unspecified site: Secondary | ICD-10-CM | POA: Diagnosis present

## 2022-08-20 DIAGNOSIS — G4733 Obstructive sleep apnea (adult) (pediatric): Secondary | ICD-10-CM | POA: Diagnosis present

## 2022-08-20 DIAGNOSIS — K5900 Constipation, unspecified: Secondary | ICD-10-CM | POA: Diagnosis present

## 2022-08-20 DIAGNOSIS — E669 Obesity, unspecified: Secondary | ICD-10-CM | POA: Diagnosis present

## 2022-08-24 DIAGNOSIS — F32A Depression, unspecified: Secondary | ICD-10-CM | POA: Diagnosis not present

## 2022-08-24 DIAGNOSIS — M792 Neuralgia and neuritis, unspecified: Secondary | ICD-10-CM | POA: Diagnosis not present

## 2022-08-24 DIAGNOSIS — G4733 Obstructive sleep apnea (adult) (pediatric): Secondary | ICD-10-CM | POA: Diagnosis not present

## 2022-08-24 DIAGNOSIS — I10 Essential (primary) hypertension: Secondary | ICD-10-CM | POA: Diagnosis not present

## 2022-08-24 DIAGNOSIS — T17320A Food in larynx causing asphyxiation, initial encounter: Secondary | ICD-10-CM | POA: Diagnosis not present

## 2022-08-24 DIAGNOSIS — G7281 Critical illness myopathy: Secondary | ICD-10-CM | POA: Diagnosis not present

## 2022-08-24 DIAGNOSIS — H409 Unspecified glaucoma: Secondary | ICD-10-CM | POA: Diagnosis not present

## 2022-08-24 DIAGNOSIS — B952 Enterococcus as the cause of diseases classified elsewhere: Secondary | ICD-10-CM | POA: Diagnosis not present

## 2022-08-24 DIAGNOSIS — N390 Urinary tract infection, site not specified: Secondary | ICD-10-CM | POA: Diagnosis not present

## 2022-08-24 DIAGNOSIS — G629 Polyneuropathy, unspecified: Secondary | ICD-10-CM | POA: Diagnosis not present

## 2022-08-24 DIAGNOSIS — N401 Enlarged prostate with lower urinary tract symptoms: Secondary | ICD-10-CM | POA: Diagnosis not present

## 2022-08-24 DIAGNOSIS — R2689 Other abnormalities of gait and mobility: Secondary | ICD-10-CM | POA: Diagnosis not present

## 2022-08-24 DIAGNOSIS — Z902 Acquired absence of lung [part of]: Secondary | ICD-10-CM | POA: Diagnosis not present

## 2022-08-24 DIAGNOSIS — K5900 Constipation, unspecified: Secondary | ICD-10-CM | POA: Diagnosis not present

## 2022-08-24 DIAGNOSIS — Z794 Long term (current) use of insulin: Secondary | ICD-10-CM | POA: Diagnosis not present

## 2022-08-24 DIAGNOSIS — E1122 Type 2 diabetes mellitus with diabetic chronic kidney disease: Secondary | ICD-10-CM | POA: Diagnosis not present

## 2022-08-24 DIAGNOSIS — C61 Malignant neoplasm of prostate: Secondary | ICD-10-CM | POA: Diagnosis not present

## 2022-08-24 DIAGNOSIS — G20A1 Parkinson's disease without dyskinesia, without mention of fluctuations: Secondary | ICD-10-CM | POA: Diagnosis not present

## 2022-08-24 DIAGNOSIS — E1165 Type 2 diabetes mellitus with hyperglycemia: Secondary | ICD-10-CM | POA: Diagnosis not present

## 2022-08-24 DIAGNOSIS — Z8546 Personal history of malignant neoplasm of prostate: Secondary | ICD-10-CM | POA: Diagnosis not present

## 2022-08-24 DIAGNOSIS — J189 Pneumonia, unspecified organism: Secondary | ICD-10-CM | POA: Diagnosis not present

## 2022-08-24 DIAGNOSIS — Z85118 Personal history of other malignant neoplasm of bronchus and lung: Secondary | ICD-10-CM | POA: Diagnosis not present

## 2022-08-24 DIAGNOSIS — E782 Mixed hyperlipidemia: Secondary | ICD-10-CM | POA: Diagnosis not present

## 2022-08-24 DIAGNOSIS — K117 Disturbances of salivary secretion: Secondary | ICD-10-CM | POA: Diagnosis not present

## 2022-08-24 DIAGNOSIS — Z6835 Body mass index (BMI) 35.0-35.9, adult: Secondary | ICD-10-CM | POA: Diagnosis not present

## 2022-08-24 DIAGNOSIS — G20C Parkinsonism, unspecified: Secondary | ICD-10-CM | POA: Diagnosis not present

## 2022-08-24 DIAGNOSIS — R051 Acute cough: Secondary | ICD-10-CM | POA: Diagnosis not present

## 2022-08-24 DIAGNOSIS — N183 Chronic kidney disease, stage 3 unspecified: Secondary | ICD-10-CM | POA: Diagnosis not present

## 2022-08-24 DIAGNOSIS — D509 Iron deficiency anemia, unspecified: Secondary | ICD-10-CM | POA: Diagnosis not present

## 2022-08-24 DIAGNOSIS — J984 Other disorders of lung: Secondary | ICD-10-CM | POA: Diagnosis not present

## 2022-08-24 DIAGNOSIS — Z466 Encounter for fitting and adjustment of urinary device: Secondary | ICD-10-CM | POA: Diagnosis not present

## 2022-08-24 DIAGNOSIS — R339 Retention of urine, unspecified: Secondary | ICD-10-CM | POA: Diagnosis not present

## 2022-08-24 DIAGNOSIS — R600 Localized edema: Secondary | ICD-10-CM | POA: Diagnosis not present

## 2022-08-24 DIAGNOSIS — R059 Cough, unspecified: Secondary | ICD-10-CM | POA: Diagnosis not present

## 2022-08-24 DIAGNOSIS — R338 Other retention of urine: Secondary | ICD-10-CM | POA: Diagnosis not present

## 2022-08-24 DIAGNOSIS — R1310 Dysphagia, unspecified: Secondary | ICD-10-CM | POA: Diagnosis not present

## 2022-09-02 DIAGNOSIS — R1310 Dysphagia, unspecified: Secondary | ICD-10-CM | POA: Diagnosis not present

## 2022-09-02 DIAGNOSIS — T17320A Food in larynx causing asphyxiation, initial encounter: Secondary | ICD-10-CM | POA: Diagnosis not present

## 2022-09-02 DIAGNOSIS — R059 Cough, unspecified: Secondary | ICD-10-CM | POA: Diagnosis not present

## 2022-09-10 ENCOUNTER — Telehealth: Payer: Self-pay

## 2022-09-10 DIAGNOSIS — N401 Enlarged prostate with lower urinary tract symptoms: Secondary | ICD-10-CM | POA: Diagnosis not present

## 2022-09-10 DIAGNOSIS — M1990 Unspecified osteoarthritis, unspecified site: Secondary | ICD-10-CM | POA: Diagnosis not present

## 2022-09-10 DIAGNOSIS — G7281 Critical illness myopathy: Secondary | ICD-10-CM | POA: Diagnosis not present

## 2022-09-10 DIAGNOSIS — Z466 Encounter for fitting and adjustment of urinary device: Secondary | ICD-10-CM | POA: Diagnosis not present

## 2022-09-10 DIAGNOSIS — Z6835 Body mass index (BMI) 35.0-35.9, adult: Secondary | ICD-10-CM | POA: Diagnosis not present

## 2022-09-10 DIAGNOSIS — I129 Hypertensive chronic kidney disease with stage 1 through stage 4 chronic kidney disease, or unspecified chronic kidney disease: Secondary | ICD-10-CM | POA: Diagnosis not present

## 2022-09-10 DIAGNOSIS — Z85118 Personal history of other malignant neoplasm of bronchus and lung: Secondary | ICD-10-CM | POA: Diagnosis not present

## 2022-09-10 DIAGNOSIS — K219 Gastro-esophageal reflux disease without esophagitis: Secondary | ICD-10-CM | POA: Diagnosis not present

## 2022-09-10 DIAGNOSIS — E1142 Type 2 diabetes mellitus with diabetic polyneuropathy: Secondary | ICD-10-CM | POA: Diagnosis not present

## 2022-09-10 DIAGNOSIS — E1122 Type 2 diabetes mellitus with diabetic chronic kidney disease: Secondary | ICD-10-CM | POA: Diagnosis not present

## 2022-09-10 DIAGNOSIS — G4733 Obstructive sleep apnea (adult) (pediatric): Secondary | ICD-10-CM | POA: Diagnosis not present

## 2022-09-10 DIAGNOSIS — J189 Pneumonia, unspecified organism: Secondary | ICD-10-CM | POA: Diagnosis not present

## 2022-09-10 DIAGNOSIS — N3001 Acute cystitis with hematuria: Secondary | ICD-10-CM | POA: Diagnosis not present

## 2022-09-10 DIAGNOSIS — C61 Malignant neoplasm of prostate: Secondary | ICD-10-CM | POA: Diagnosis not present

## 2022-09-10 DIAGNOSIS — R338 Other retention of urine: Secondary | ICD-10-CM | POA: Diagnosis not present

## 2022-09-10 DIAGNOSIS — F419 Anxiety disorder, unspecified: Secondary | ICD-10-CM | POA: Diagnosis not present

## 2022-09-10 DIAGNOSIS — H409 Unspecified glaucoma: Secondary | ICD-10-CM | POA: Diagnosis not present

## 2022-09-10 DIAGNOSIS — J40 Bronchitis, not specified as acute or chronic: Secondary | ICD-10-CM | POA: Diagnosis not present

## 2022-09-10 DIAGNOSIS — E782 Mixed hyperlipidemia: Secondary | ICD-10-CM | POA: Diagnosis not present

## 2022-09-10 DIAGNOSIS — N183 Chronic kidney disease, stage 3 unspecified: Secondary | ICD-10-CM | POA: Diagnosis not present

## 2022-09-10 DIAGNOSIS — G20C Parkinsonism, unspecified: Secondary | ICD-10-CM | POA: Diagnosis not present

## 2022-09-10 DIAGNOSIS — Z794 Long term (current) use of insulin: Secondary | ICD-10-CM | POA: Diagnosis not present

## 2022-09-10 DIAGNOSIS — D509 Iron deficiency anemia, unspecified: Secondary | ICD-10-CM | POA: Diagnosis not present

## 2022-09-10 DIAGNOSIS — Z9181 History of falling: Secondary | ICD-10-CM | POA: Diagnosis not present

## 2022-09-10 NOTE — Telephone Encounter (Signed)
Received call from [facility] @ [facility] stating Mr. Chicoine will resume therapy. 1 visit x 5 weeks and EOW x 4 weeks

## 2022-09-13 ENCOUNTER — Telehealth: Payer: Self-pay

## 2022-09-13 DIAGNOSIS — E1122 Type 2 diabetes mellitus with diabetic chronic kidney disease: Secondary | ICD-10-CM | POA: Diagnosis not present

## 2022-09-13 DIAGNOSIS — N3001 Acute cystitis with hematuria: Secondary | ICD-10-CM | POA: Diagnosis not present

## 2022-09-13 DIAGNOSIS — I129 Hypertensive chronic kidney disease with stage 1 through stage 4 chronic kidney disease, or unspecified chronic kidney disease: Secondary | ICD-10-CM | POA: Diagnosis not present

## 2022-09-13 DIAGNOSIS — G7281 Critical illness myopathy: Secondary | ICD-10-CM | POA: Diagnosis not present

## 2022-09-13 DIAGNOSIS — N183 Chronic kidney disease, stage 3 unspecified: Secondary | ICD-10-CM | POA: Diagnosis not present

## 2022-09-13 DIAGNOSIS — J189 Pneumonia, unspecified organism: Secondary | ICD-10-CM | POA: Diagnosis not present

## 2022-09-13 NOTE — Telephone Encounter (Signed)
[facility] @ [facility] called for VO's for Physical Therapy.   Frequency: 1 visit x 1 week ; 2 x 2; 1 x 1; 1 visit every other week x 4 weeks  Please advise.

## 2022-09-17 NOTE — Telephone Encounter (Signed)
Spoke to [place]. VO's granted for PT

## 2022-09-18 DIAGNOSIS — N183 Chronic kidney disease, stage 3 unspecified: Secondary | ICD-10-CM | POA: Diagnosis not present

## 2022-09-18 DIAGNOSIS — I129 Hypertensive chronic kidney disease with stage 1 through stage 4 chronic kidney disease, or unspecified chronic kidney disease: Secondary | ICD-10-CM | POA: Diagnosis not present

## 2022-09-18 DIAGNOSIS — J189 Pneumonia, unspecified organism: Secondary | ICD-10-CM | POA: Diagnosis not present

## 2022-09-18 DIAGNOSIS — N3001 Acute cystitis with hematuria: Secondary | ICD-10-CM | POA: Diagnosis not present

## 2022-09-18 DIAGNOSIS — G7281 Critical illness myopathy: Secondary | ICD-10-CM | POA: Diagnosis not present

## 2022-09-18 DIAGNOSIS — E1122 Type 2 diabetes mellitus with diabetic chronic kidney disease: Secondary | ICD-10-CM | POA: Diagnosis not present

## 2022-09-19 ENCOUNTER — Telehealth: Payer: Self-pay

## 2022-09-19 DIAGNOSIS — I129 Hypertensive chronic kidney disease with stage 1 through stage 4 chronic kidney disease, or unspecified chronic kidney disease: Secondary | ICD-10-CM | POA: Diagnosis not present

## 2022-09-19 DIAGNOSIS — G7281 Critical illness myopathy: Secondary | ICD-10-CM | POA: Diagnosis not present

## 2022-09-19 DIAGNOSIS — J189 Pneumonia, unspecified organism: Secondary | ICD-10-CM | POA: Diagnosis not present

## 2022-09-19 DIAGNOSIS — N3001 Acute cystitis with hematuria: Secondary | ICD-10-CM | POA: Diagnosis not present

## 2022-09-19 DIAGNOSIS — N183 Chronic kidney disease, stage 3 unspecified: Secondary | ICD-10-CM | POA: Diagnosis not present

## 2022-09-19 DIAGNOSIS — E1122 Type 2 diabetes mellitus with diabetic chronic kidney disease: Secondary | ICD-10-CM | POA: Diagnosis not present

## 2022-09-19 NOTE — Telephone Encounter (Signed)
Requesting VO"s for 1 vist x 4 weeks of OT.  [place] @ [facility]  [phone]

## 2022-09-20 DIAGNOSIS — N3001 Acute cystitis with hematuria: Secondary | ICD-10-CM | POA: Diagnosis not present

## 2022-09-20 DIAGNOSIS — I129 Hypertensive chronic kidney disease with stage 1 through stage 4 chronic kidney disease, or unspecified chronic kidney disease: Secondary | ICD-10-CM | POA: Diagnosis not present

## 2022-09-20 DIAGNOSIS — E1122 Type 2 diabetes mellitus with diabetic chronic kidney disease: Secondary | ICD-10-CM | POA: Diagnosis not present

## 2022-09-20 DIAGNOSIS — G7281 Critical illness myopathy: Secondary | ICD-10-CM | POA: Diagnosis not present

## 2022-09-20 DIAGNOSIS — J189 Pneumonia, unspecified organism: Secondary | ICD-10-CM | POA: Diagnosis not present

## 2022-09-20 DIAGNOSIS — N183 Chronic kidney disease, stage 3 unspecified: Secondary | ICD-10-CM | POA: Diagnosis not present

## 2022-09-23 ENCOUNTER — Telehealth: Payer: Self-pay

## 2022-09-23 NOTE — Telephone Encounter (Signed)
Rcvd vm from [facility] @ [facility] [phone]) requesting VO to switch missed eval from last week to this week.   VO's granted.

## 2022-09-24 DIAGNOSIS — G7281 Critical illness myopathy: Secondary | ICD-10-CM | POA: Diagnosis not present

## 2022-09-24 DIAGNOSIS — N3001 Acute cystitis with hematuria: Secondary | ICD-10-CM | POA: Diagnosis not present

## 2022-09-24 DIAGNOSIS — I129 Hypertensive chronic kidney disease with stage 1 through stage 4 chronic kidney disease, or unspecified chronic kidney disease: Secondary | ICD-10-CM | POA: Diagnosis not present

## 2022-09-24 DIAGNOSIS — E1122 Type 2 diabetes mellitus with diabetic chronic kidney disease: Secondary | ICD-10-CM | POA: Diagnosis not present

## 2022-09-24 DIAGNOSIS — N183 Chronic kidney disease, stage 3 unspecified: Secondary | ICD-10-CM | POA: Diagnosis not present

## 2022-09-24 DIAGNOSIS — J189 Pneumonia, unspecified organism: Secondary | ICD-10-CM | POA: Diagnosis not present

## 2022-09-25 DIAGNOSIS — G7281 Critical illness myopathy: Secondary | ICD-10-CM | POA: Diagnosis not present

## 2022-09-25 DIAGNOSIS — N3001 Acute cystitis with hematuria: Secondary | ICD-10-CM | POA: Diagnosis not present

## 2022-09-25 DIAGNOSIS — J189 Pneumonia, unspecified organism: Secondary | ICD-10-CM | POA: Diagnosis not present

## 2022-09-25 DIAGNOSIS — I129 Hypertensive chronic kidney disease with stage 1 through stage 4 chronic kidney disease, or unspecified chronic kidney disease: Secondary | ICD-10-CM | POA: Diagnosis not present

## 2022-09-25 DIAGNOSIS — N183 Chronic kidney disease, stage 3 unspecified: Secondary | ICD-10-CM | POA: Diagnosis not present

## 2022-09-25 DIAGNOSIS — E1122 Type 2 diabetes mellitus with diabetic chronic kidney disease: Secondary | ICD-10-CM | POA: Diagnosis not present

## 2022-09-26 ENCOUNTER — Encounter: Payer: Self-pay | Admitting: Family Medicine

## 2022-09-26 ENCOUNTER — Ambulatory Visit (INDEPENDENT_AMBULATORY_CARE_PROVIDER_SITE_OTHER): Payer: Medicare Other

## 2022-09-26 ENCOUNTER — Ambulatory Visit (INDEPENDENT_AMBULATORY_CARE_PROVIDER_SITE_OTHER): Payer: Medicare Other | Admitting: Family Medicine

## 2022-09-26 VITALS — BP 138/71 | HR 69 | Ht 69.0 in | Wt 235.1 lb

## 2022-09-26 DIAGNOSIS — G20C Parkinsonism, unspecified: Secondary | ICD-10-CM | POA: Diagnosis not present

## 2022-09-26 DIAGNOSIS — Z794 Long term (current) use of insulin: Secondary | ICD-10-CM | POA: Diagnosis not present

## 2022-09-26 DIAGNOSIS — J189 Pneumonia, unspecified organism: Secondary | ICD-10-CM | POA: Diagnosis not present

## 2022-09-26 DIAGNOSIS — Z23 Encounter for immunization: Secondary | ICD-10-CM

## 2022-09-26 DIAGNOSIS — N184 Chronic kidney disease, stage 4 (severe): Secondary | ICD-10-CM

## 2022-09-26 DIAGNOSIS — R0602 Shortness of breath: Secondary | ICD-10-CM | POA: Diagnosis not present

## 2022-09-26 DIAGNOSIS — I10 Essential (primary) hypertension: Secondary | ICD-10-CM | POA: Diagnosis not present

## 2022-09-26 DIAGNOSIS — E1122 Type 2 diabetes mellitus with diabetic chronic kidney disease: Secondary | ICD-10-CM | POA: Diagnosis not present

## 2022-09-26 LAB — POCT UA - MICROALBUMIN
Albumin/Creatinine Ratio, Urine, POC: <30
Creatinine, POC: 100 mg/dL
Microalbumin Ur, POC: 30 mg/L

## 2022-09-27 LAB — COMPLETE METABOLIC PANEL WITH GFR
AG Ratio: 1.7 (calc) (ref 1.0–2.5)
ALT: 6 U/L — ABNORMAL LOW (ref 9–46)
AST: 13 U/L (ref 10–35)
Albumin: 4.4 g/dL (ref 3.6–5.1)
Alkaline phosphatase (APISO): 39 U/L (ref 35–144)
BUN/Creatinine Ratio: 19 (calc) (ref 6–22)
BUN: 28 mg/dL — ABNORMAL HIGH (ref 7–25)
CO2: 31 mmol/L (ref 20–32)
Calcium: 9.7 mg/dL (ref 8.6–10.3)
Chloride: 102 mmol/L (ref 98–110)
Creat: 1.51 mg/dL — ABNORMAL HIGH (ref 0.70–1.35)
Globulin: 2.6 g/dL (calc) (ref 1.9–3.7)
Glucose, Bld: 207 mg/dL — ABNORMAL HIGH (ref 65–99)
Potassium: 4.4 mmol/L (ref 3.5–5.3)
Sodium: 141 mmol/L (ref 135–146)
Total Bilirubin: 0.3 mg/dL (ref 0.2–1.2)
Total Protein: 7 g/dL (ref 6.1–8.1)
eGFR: 51 mL/min/{1.73_m2} — ABNORMAL LOW (ref >=60)

## 2022-09-27 LAB — CBC WITH DIFFERENTIAL/PLATELET
Absolute Monocytes: 557 cells/uL (ref 200–950)
Basophils Absolute: 52 cells/uL (ref 0–200)
Basophils Relative: 0.6 %
Eosinophils Absolute: 191 cells/uL (ref 15–500)
Eosinophils Relative: 2.2 %
HCT: 36.7 % — ABNORMAL LOW (ref 38.5–50.0)
Hemoglobin: 11.7 g/dL — ABNORMAL LOW (ref 13.2–17.1)
Lymphs Abs: 1514 cells/uL (ref 850–3900)
MCH: 25.8 pg — ABNORMAL LOW (ref 27.0–33.0)
MCHC: 31.9 g/dL — ABNORMAL LOW (ref 32.0–36.0)
MCV: 80.8 fL (ref 80.0–100.0)
MPV: 10.7 fL (ref 7.5–12.5)
Monocytes Relative: 6.4 %
Neutro Abs: 6386 cells/uL (ref 1500–7800)
Neutrophils Relative %: 73.4 %
Platelets: 324 10*3/uL (ref 140–400)
RBC: 4.54 10*6/uL (ref 4.20–5.80)
RDW: 15.1 % — ABNORMAL HIGH (ref 11.0–15.0)
Total Lymphocyte: 17.4 %
WBC: 8.7 10*3/uL (ref 3.8–10.8)

## 2022-09-28 DIAGNOSIS — N183 Chronic kidney disease, stage 3 unspecified: Secondary | ICD-10-CM | POA: Diagnosis not present

## 2022-09-28 DIAGNOSIS — N3001 Acute cystitis with hematuria: Secondary | ICD-10-CM | POA: Diagnosis not present

## 2022-09-28 DIAGNOSIS — E1122 Type 2 diabetes mellitus with diabetic chronic kidney disease: Secondary | ICD-10-CM | POA: Diagnosis not present

## 2022-09-28 DIAGNOSIS — J189 Pneumonia, unspecified organism: Secondary | ICD-10-CM | POA: Diagnosis not present

## 2022-09-28 DIAGNOSIS — I129 Hypertensive chronic kidney disease with stage 1 through stage 4 chronic kidney disease, or unspecified chronic kidney disease: Secondary | ICD-10-CM | POA: Diagnosis not present

## 2022-09-28 DIAGNOSIS — G7281 Critical illness myopathy: Secondary | ICD-10-CM | POA: Diagnosis not present

## 2022-09-29 ENCOUNTER — Encounter: Payer: Self-pay | Admitting: Family Medicine

## 2022-09-29 DIAGNOSIS — J189 Pneumonia, unspecified organism: Secondary | ICD-10-CM | POA: Insufficient documentation

## 2022-09-29 NOTE — Assessment & Plan Note (Signed)
Blood pressure remains well-controlled.  Recommend continuation of current medications.

## 2022-09-29 NOTE — Assessment & Plan Note (Signed)
Fairly well-controlled at this time.  Continue with [place] and Trulicity at current strength.

## 2022-09-29 NOTE — Assessment & Plan Note (Signed)
Recent hospitalization for pneumonia.  Completed course of antibiotics with improvement of symptoms.  He did complete inpatient rehab and continues with home health rehab due to continued weakness.  I did discuss with him that I am not sure how much more he would gain from home health rehab.  We have discussed moving to skilled nursing due to his significant weakness and progressive Parkinson's.  He declines at this time and would prefer to stay at home.  His wife is back in the home to help.

## 2022-09-29 NOTE — Assessment & Plan Note (Signed)
Management per neurology.  Stable at this time.

## 2022-09-29 NOTE — Progress Notes (Signed)
Randall Fisher - 67 y.o. male MRN [id number]  Date of birth: 1956-09-02  Subjective Chief Complaint  Patient presents with   Hospitalization Follow-up    HPI Randall Fisher is a 67 year old male here today for hospital follow-up.  He was admitted in early December for pneumonia.  Initially presented with 3 to 4 weeks of progressive dyspnea, cough and malaise.  History of postoperative pneumonia with masslike opacity in the right hilar region.  Treated with IV antibiotics with addition to p.o. antibiotics prior to discharge to inpatient rehab.  He did well with inpatient rehab and his wife found this is quite beneficial.  He continues with home health rehab, he was returned to his home.  He continues to have significant decrease in strength.  He did have acute kidney injury.  This resolved during his hospitalization.  Urine culture did grow Enterococcus, he did not have any symptoms during his hospitalization.  Continues on Sinemet for management of Parkinson's.  Stable at this time.  Denies any difficulty swallowing.  ROS:  A comprehensive ROS was completed and negative except as noted per HPI  Allergies  Allergen Reactions   Shellfish Allergy Swelling    Throat/hand swelling   Mushroom Extract Complex Swelling    Past Medical History:  Diagnosis Date   Anxiety October 2020   Back pain    Depression October 2020   Diabetes mellitus without complication ([place])    GERD (gastroesophageal reflux disease) Unknown   Glaucoma January 2021   Hyperlipidemia    Joint pain    Kidney disease    Leg swelling    Lung cancer (HCC)    Memory loss    Neuropathy    Numbness and tingling in both hands    Parkinson's disease 2021   S/P lobectomy of lung 2013   RUL for NSCLC   Sleep apnea Unknown    Past Surgical History:  Procedure Laterality Date   CARPAL TUNNEL RELEASE Bilateral    LUNG LOBECTOMY Right 2013   TONSILLECTOMY      Social History   Socioeconomic History   Marital status:  Married    Spouse name: Randall Fisher   Number of children: 2   Years of education: 14   Highest education level: Some college, no degree  Occupational History   Occupation: unemployed - laid off   Occupation: seeking disability   Occupation: disabled  Tobacco Use   Smoking status: Former    Packs/day: 1.50    Years: 40.00    Total pack years: 60.00    Types: Cigarettes    Quit date: 11/15/2011    Years since quitting: 10.8   Smokeless tobacco: Never  Vaping Use   Vaping Use: Former  Substance and Sexual Activity   Alcohol use: Not Currently    Comment: ocassionally   Drug use: Never   Sexual activity: Not Currently    Partners: Female    Birth control/protection: Condom  Other Topics Concern   Not on file  Social History Narrative   Lives with his wife. Spending time with grandkids.      Social Determinants of Health   Financial Resource Strain: Low Risk  (07/29/2022)   Overall Financial Resource Strain (CARDIA)    Difficulty of Paying Living Expenses: Not hard at all  Food Insecurity: No Food Insecurity (07/29/2022)   Hunger Vital Sign    Worried About Running Out of Food in the Last Year: Never true    Ran Out of Food in the Last  Year: Never true  Transportation Needs: No Transportation Needs (07/29/2022)   PRAPARE - Hydrologist (Medical): No    Lack of Transportation (Non-Medical): No  Physical Activity: Insufficiently Active (07/29/2022)   Exercise Vital Sign    Days of Exercise per Week: 2 days    Minutes of Exercise per Session: 50 min  Stress: No Stress Concern Present (07/29/2022)   [place]    Feeling of Stress : Not at all  Social Connections: Moderately Isolated (07/29/2022)   Social Connection and Isolation Panel [NHANES]    Frequency of Communication with Friends and Family: More than three times a week    Frequency of Social Gatherings with Friends and Family:  Once a week    Attends Religious Services: Never    Marine scientist or Organizations: No    Attends Music therapist: Never    Marital Status: Married    Family History  Problem Relation Age of Onset   Diabetes Mother    Cancer Mother    Kidney failure Father    Kidney disease Father     Health Maintenance  Topic Date Due   FOOT EXAM  07/26/2022   COVID-19 Vaccine (4 - 2023-24 season) 10/17/2022 (Originally 05/17/2022)   Zoster Vaccines- Shingrix (1 of 2) 10/17/2022 (Originally 02/29/1976)   Hepatitis C Screening  01/01/2023 (Originally 03/01/1975)   HEMOGLOBIN A1C  01/10/2023   OPHTHALMOLOGY EXAM  06/16/2023   Medicare Annual Wellness (AWV)  07/30/2023   Diabetic kidney evaluation - eGFR measurement  09/27/2023   Diabetic kidney evaluation - Urine ACR  09/27/2023   DTaP/Tdap/Td (3 - Td or Tdap) 07/16/2026   COLONOSCOPY (Pts 45-38yrs Insurance coverage will need to be confirmed)  08/17/2029   Pneumonia Vaccine 23+ Years old  Completed   INFLUENZA VACCINE  Completed   HPV VACCINES  Aged Out   HIV Screening  Discontinued     ----------------------------------------------------------------------------------------------------------------------------------------------------------------------------------------------------------------- Physical Exam BP 138/71 (BP Location: Left Arm, Patient Position: Sitting, Cuff Size: Large)   Pulse 69   Ht 5\' 9"  (1.753 m)   Wt 235 lb 1.9 oz (106.6 kg)   SpO2 98%   BMI 34.72 kg/m   Physical Exam Constitutional:      Appearance: Normal appearance.  HENT:     Head: Normocephalic and atraumatic.  Eyes:     General: No scleral icterus. Cardiovascular:     Rate and Rhythm: Normal rate and regular rhythm.  Pulmonary:     Effort: Pulmonary effort is normal.     Breath sounds: Normal breath sounds.  Neurological:     Mental Status: He is alert.  Psychiatric:        Mood and Affect: Mood normal.        Behavior: Behavior  normal.     ------------------------------------------------------------------------------------------------------------------------------------------------------------------------------------------------------------------- Assessment and Plan  Hypertension Blood pressure remains well-controlled.  Recommend continuation of current medications.  Pneumonia Recent hospitalization for pneumonia.  Completed course of antibiotics with improvement of symptoms.  He did complete inpatient rehab and continues with home health rehab due to continued weakness.  I did discuss with him that I am not sure how much more he would gain from home health rehab.  We have discussed moving to skilled nursing due to his significant weakness and progressive Parkinson's.  He declines at this time and would prefer to stay at home.  His wife is back in the home to help.  Parkinsonism (  HCC) Management per neurology.  Stable at this time.  DM (diabetes mellitus) (HCC) Fairly well-controlled at this time.  Continue with [place] and Trulicity at current strength.   No orders of the defined types were placed in this encounter.   No follow-ups on file.    This visit occurred during the SARS-CoV-2 public health emergency.  Safety protocols were in place, including screening questions prior to the visit, additional usage of staff PPE, and extensive cleaning of exam room while observing appropriate contact time as indicated for disinfecting solutions.

## 2022-10-01 ENCOUNTER — Other Ambulatory Visit: Payer: Self-pay

## 2022-10-01 ENCOUNTER — Telehealth: Payer: Self-pay | Admitting: Family Medicine

## 2022-10-01 DIAGNOSIS — J329 Chronic sinusitis, unspecified: Secondary | ICD-10-CM

## 2022-10-01 DIAGNOSIS — J069 Acute upper respiratory infection, unspecified: Secondary | ICD-10-CM

## 2022-10-01 MED ORDER — ALBUTEROL SULFATE (2.5 MG/3ML) 0.083% IN NEBU: 2.5000 mg | INHALATION_SOLUTION | Freq: Four times a day (QID) | RESPIRATORY_TRACT | 1 refills | Status: DC | PRN

## 2022-10-01 MED ORDER — STOOL SOFTENER/LAXATIVE 50-8.6 MG PO TABS: 2.0000 | ORAL_TABLET | Freq: Every day | ORAL | 1 refills | Status: DC

## 2022-10-01 MED ORDER — FEROSUL 325 (65 FE) MG PO TABS: 325.0000 mg | ORAL_TABLET | Freq: Every day | ORAL | 1 refills | Status: DC

## 2022-10-01 MED ORDER — MIDODRINE HCL 5 MG PO TABS: 2.5000 mg | ORAL_TABLET | Freq: Three times a day (TID) | ORAL | 1 refills | Status: AC

## 2022-10-01 NOTE — Telephone Encounter (Signed)
Patient's wife called requesting refills on Midodrine 5mg  tabs, Albuterol 2.5mg , Sennosides-Cocusate, Serrous-Sulfate 325mg  sent to [facility]

## 2022-10-01 NOTE — Telephone Encounter (Signed)
Patient's wife called on patient's behalf. Requesting med refills for Midodrine, sennoside docusate, and Ferrous sulfate be sent to [facility] mail order pharmacy. Rxs written by historical provider.

## 2022-10-02 DIAGNOSIS — E1122 Type 2 diabetes mellitus with diabetic chronic kidney disease: Secondary | ICD-10-CM | POA: Diagnosis not present

## 2022-10-02 DIAGNOSIS — N3001 Acute cystitis with hematuria: Secondary | ICD-10-CM | POA: Diagnosis not present

## 2022-10-02 DIAGNOSIS — N183 Chronic kidney disease, stage 3 unspecified: Secondary | ICD-10-CM | POA: Diagnosis not present

## 2022-10-02 DIAGNOSIS — J189 Pneumonia, unspecified organism: Secondary | ICD-10-CM | POA: Diagnosis not present

## 2022-10-02 DIAGNOSIS — G7281 Critical illness myopathy: Secondary | ICD-10-CM | POA: Diagnosis not present

## 2022-10-02 DIAGNOSIS — I129 Hypertensive chronic kidney disease with stage 1 through stage 4 chronic kidney disease, or unspecified chronic kidney disease: Secondary | ICD-10-CM | POA: Diagnosis not present

## 2022-10-03 DIAGNOSIS — E1122 Type 2 diabetes mellitus with diabetic chronic kidney disease: Secondary | ICD-10-CM | POA: Diagnosis not present

## 2022-10-03 DIAGNOSIS — I129 Hypertensive chronic kidney disease with stage 1 through stage 4 chronic kidney disease, or unspecified chronic kidney disease: Secondary | ICD-10-CM | POA: Diagnosis not present

## 2022-10-03 DIAGNOSIS — J189 Pneumonia, unspecified organism: Secondary | ICD-10-CM | POA: Diagnosis not present

## 2022-10-03 DIAGNOSIS — N3001 Acute cystitis with hematuria: Secondary | ICD-10-CM | POA: Diagnosis not present

## 2022-10-03 DIAGNOSIS — N183 Chronic kidney disease, stage 3 unspecified: Secondary | ICD-10-CM | POA: Diagnosis not present

## 2022-10-03 DIAGNOSIS — G7281 Critical illness myopathy: Secondary | ICD-10-CM | POA: Diagnosis not present

## 2022-10-09 ENCOUNTER — Telehealth: Payer: Medicare Other | Admitting: Nurse Practitioner

## 2022-10-09 DIAGNOSIS — J40 Bronchitis, not specified as acute or chronic: Secondary | ICD-10-CM

## 2022-10-09 MED ORDER — DOXYCYCLINE HYCLATE 100 MG PO TABS: 100.0000 mg | ORAL_TABLET | Freq: Two times a day (BID) | ORAL | 0 refills | Status: DC

## 2022-10-09 NOTE — Progress Notes (Signed)
Virtual Visit Consent   Randall Fisher, you are scheduled for a virtual visit with Randall Hassell Done, FNP, a [facility] provider, today.     Just as with appointments in the office, your consent must be obtained to participate.  Your consent will be active for this visit and any virtual visit you may have with one of our providers in the next 365 days.     If you have a MyChart account, a copy of this consent can be sent to you electronically.  All virtual visits are billed to your insurance company just like a traditional visit in the office.    As this is a virtual visit, video technology does not allow for your provider to perform a traditional examination.  This may limit your provider's ability to fully assess your condition.  If your provider identifies any concerns that need to be evaluated in person or the need to arrange testing (such as labs, EKG, etc.), we will make arrangements to do so.     Although advances in technology are sophisticated, we cannot ensure that it will always work on either your end or our end.  If the connection with a video visit is poor, the visit may have to be switched to a telephone visit.  With either a video or telephone visit, we are not always able to ensure that we have a secure connection.     I need to obtain your verbal consent now.   Are you willing to proceed with your visit today? YES   Randall Fisher has provided verbal consent on 10/09/2022 for a virtual visit (video or telephone).   Randall Hassell Done, FNP   Date: 10/09/2022 1:20 PM   Virtual Visit via Video Note   I, Randall Fisher, [name] ([id number], 06/04/1956) on 10/09/22 at  1:30 PM EST by a video-enabled telemedicine application and verified that I am speaking with the correct person using two identifiers.  Location: Patient: Virtual Visit Location Patient: Home Provider: Virtual Visit Location Provider: Mobile   I discussed the limitations of  evaluation and management by telemedicine and the availability of in person appointments. The patient expressed understanding and agreed to proceed.    History of Present Illness: Randall Fisher is a 66 y.o. who identifies as a male who was assigned male at birth, and is being seen today for bronchitis.  HPI: Cough This is a new problem. The problem has been gradually worsening. The problem occurs every few minutes. Associated symptoms include rhinorrhea. Pertinent negatives include no ear congestion, ear pain or fever. He has tried OTC cough suppressant for the symptoms. The treatment provided no relief. His past medical history is significant for bronchitis.    Review of Systems  Constitutional:  Negative for fever.  HENT:  Positive for rhinorrhea. Negative for ear pain.   Respiratory:  Positive for cough.     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   Parkinsonism 01/02/2021   Mild cognitive disorder 10/16/2020   Cyst of kidney, acquired 10/16/2020   Angina pectoris ([place]) 10/16/2020   Abnormal gait 10/16/2020   Spondylosis without myelopathy or radiculopathy, lumbar region 10/16/2020   Urinary urgency 09/24/2020   Right carpal tunnel syndrome  08/28/2020   Major depression 04/18/2020   Venous insufficiency 04/18/2020   Obstructive sleep apnea syndrome 02/24/2020   Non-small cell lung cancer ([place]) 12/05/2019   Mild cognitive impairment 12/05/2019   Gastro-esophageal reflux disease without esophagitis 12/01/2019   Hypertension 12/01/2019   Benign prostatic hyperplasia with lower urinary tract symptoms 07/05/2019   Diabetic peripheral neuropathy associated with type 2 diabetes mellitus ([place]) 07/05/2019   Diabetic renal  disease ([place]) 07/05/2019   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/05/2019   Stage 3 chronic kidney disease ([place]) 07/05/2019   Mild major depression, single episode ([place]) 07/05/2019   Herpetic gingivostomatitis 07/05/2019   Functional dyspepsia 07/05/2019   DM (diabetes mellitus) ([place]) 02/17/2013   HLD (hyperlipidemia) 02/17/2013    Allergies:  Allergies  Allergen Reactions   Shellfish Allergy Swelling    Throat/hand swelling   Mushroom Extract Complex Swelling   Medications:  Current Outpatient Medications:    albuterol (PROVENTIL) (2.5 MG/3ML) 0.083% nebulizer solution, Take 3 mLs (2.5 mg total) by nebulization every 6 (six) hours as needed for wheezing or shortness of breath., Disp: 150 mL, Rfl: 1   AMBULATORY NON FORMULARY MEDICATION, Please provide lightweight transport chair.  Patient unable to propel himself in standard manual wheelchair.  Wife has trouble pushing standard wheelchair and moving in/out vehicle.  Diagnosis: Parkinsonism- G20, Gait Abnormality-R26.9, Disp: 1 Device, Rfl: 0   budesonide (PULMICORT) 0.5 MG/2ML nebulizer solution, Take 0.5 mg by nebulization daily as needed., Disp: 100 mL, Rfl: 10   carbidopa-levodopa (SINEMET IR) 25-100 MG tablet, Take 2 tablets by mouth 3 (three) times daily., Disp: 540 tablet, Rfl: 3   clotrimazole-betamethasone (LOTRISONE) cream, Apply 1 Application topically daily., Disp: 45 g, Rfl: 3   donepezil (ARICEPT) 5 MG tablet, Take 1 tablet (5 mg total) by mouth at bedtime., Disp: 90 tablet, Rfl: 1   dorzolamide-timolol (COSOPT) 2-0.5 % ophthalmic solution, Place 1 drop into the right eye 2 (two) times daily., Disp: , Rfl:    Dulaglutide (TRULICITY) 1.5 ZJ/6.9CV SOPN, INJECT 1.5 MG(0.5ML) UNDER THE SKIN ONCE A WEEK., Disp: , Rfl:    fenofibrate 160 MG tablet, Take 1 tablet (160 mg total) by mouth daily., Disp: 90 tablet, Rfl: 1   FEROSUL 325 (65 Fe) MG tablet, Take 1 tablet (325 mg total) by mouth daily., Disp: 90 tablet, Rfl: 1    fexofenadine-pseudoephedrine (ALLEGRA-D) 60-120 MG 12 hr tablet, Take 1 tablet by mouth daily as needed., Disp: , Rfl:    fluticasone (FLONASE) 50 MCG/ACT nasal spray, Place 2 sprays into both nostrils daily., Disp: 1 g, Rfl: 0   fluticasone (FLOVENT HFA) 44 MCG/ACT inhaler, Inhale 2 puffs into the lungs in the morning and at bedtime., Disp: 1 each, Rfl: 12   furosemide (LASIX) 20 MG tablet, TAKE 1 TABLET BY MOUTH DAILY, Disp: 90 tablet, Rfl: 3   insulin degludec (TRESIBA FLEXTOUCH) 200 UNIT/ML FlexTouch Pen, Inject 120 Units into the skin. , Disp: , Rfl:    Insulin Pen Needle (BD PEN NEEDLE NANO U/F) 32G X 4 MM MISC, USE WITH LEVEMIR FLEXTOUCH DAILY, Disp: 200 each, Rfl: 2   latanoprost (XALATAN) 0.005 % ophthalmic solution, Place 1 drop into both eyes at bedtime., Disp: , Rfl:    midodrine (PROAMATINE) 5 MG tablet, Take 0.5 tablets (2.5 mg total) by mouth 3 (three) times daily., Disp: 135 tablet, Rfl: 1   Misc. Devices MISC, DX: Severe sleep apnea Start bipap 16/12 cm. Water with mask and supplies, Disp: , Rfl:    Multiple  Vitamins-Minerals (MULTIVITAMIN ADULT, MINERALS, PO), Take 1 tablet by mouth., Disp: , Rfl:    NONFORMULARY OR COMPOUNDED ITEM, Please provide rolling walker.  Diagnosis: Gait instability, Disp: 1 each, Rfl: 0   nystatin-triamcinolone (MYCOLOG II) cream, APPLY TO AFFECTED AREA(S)  TOPICALLY TWICE DAILY, Disp: 30 g, Rfl: 1   omeprazole (PRILOSEC) 20 MG capsule, TAKE 1 CAPSULE BY MOUTH DAILY, Disp: 90 capsule, Rfl: 3   pregabalin (LYRICA) 200 MG capsule, Take 200 mg by mouth 2 (two) times daily. , Disp: , Rfl:    Probiotic Product (PROBIOTIC PO), Take 1 tablet by mouth daily., Disp: , Rfl:    rosuvastatin (CRESTOR) 10 MG tablet, TAKE 1 TABLET BY MOUTH DAILY, Disp: 90 tablet, Rfl: 3   sertraline (ZOLOFT) 50 MG tablet, Take 50 mg by mouth daily., Disp: , Rfl:    STOOL SOFTENER/LAXATIVE 50-8.6 MG tablet, Take 2 tablets by mouth daily., Disp: 180 tablet, Rfl: 1   tadalafil (CIALIS)  20 MG tablet, Take 0.5-1 tablets (10-20 mg total) by mouth every other day as needed (ED). Replaces sildenafil as tadalafil recently approved., Disp: 45 tablet, Rfl: 3   tamsulosin (FLOMAX) 0.4 MG CAPS capsule, TAKE 1 CAPSULE BY MOUTH DAILY, Disp: 90 capsule, Rfl: 3   timolol (TIMOPTIC) 0.5 % ophthalmic solution, Place 1 drop into the right eye every morning., Disp: , Rfl:    valACYclovir (VALTREX) 500 MG tablet, Take 500 mg by mouth daily., Disp: , Rfl:    WIXELA INHUB 250-50 MCG/ACT AEPB, USE 1 INHALATION BY MOUTH IN THE MORNING AND AT BEDTIME, Disp: 180 each, Rfl: 3  Observations/Objective: Patient is well-developed, well-nourished in no acute distress.  Resting comfortably  at home.  Head is normocephalic, traumatic.  No labored breathing.  Speech is clear and coherent with logical content.  Patient is alert and oriented at baseline.  No cough during visit  Assessment and Plan:  Randall Fisher in today with chief complaint of Bronchitis   1. Bronchitis 1. Take meds as prescribed 2. Use a cool mist humidifier especially during the winter months and when heat has been humid. 3. Use saline nose sprays frequently 4. Saline irrigations of the nose can be very helpful if Fisher frequently.  * 4X daily for 1 week*  * Use of a nettie pot can be helpful with this. Follow directions with this* 5. Drink plenty of fluids 6. Keep thermostat turn down low 7.For any cough or congestion- delsym OTC 8. For fever or aces or pains- take tylenol or ibuprofen appropriate for age and weight.  * for fevers greater than 101 orally you may alternate ibuprofen and tylenol every  3 hours.    Meds ordered this encounter  Medications   doxycycline (VIBRA-TABS) 100 MG tablet    Sig: Take 1 tablet (100 mg total) by mouth 2 (two) times daily. 1 po bid    Dispense:  20 tablet    Refill:  0    Order Specific Question:   Supervising Provider    Answer:   Chase Picket [id number]       Follow Up  Instructions: I discussed the assessment and treatment plan with the patient. The patient was provided an opportunity to ask questions and all were answered. The patient agreed with the plan and demonstrated an understanding of the instructions.  A copy of instructions were sent to the patient via MyChart.  The patient was advised to call back or seek an in-person evaluation if the symptoms worsen or if  the condition fails to improve as anticipated.  Time:  I spent 9 minutes with the patient via telehealth technology discussing the above problems/concerns.    Randall Hassell Done, FNP

## 2022-10-09 NOTE — Patient Instructions (Signed)
Randall Fisher, thank you for joining Chevis Pretty, FNP for today's virtual visit.  While this provider is not your primary care provider (PCP), if your PCP is located in our provider database this encounter information will be shared with them immediately following your visit.   [place] account gives you access to today's visit and all your visits, tests, and labs performed at [facility] " click here if you don't have a [facility] account or go to [email or web address]  Consent: (Patient) Randall Fisher provided verbal consent for this virtual visit at the beginning of the encounter.  Current Medications:  Current Outpatient Medications:    doxycycline (VIBRA-TABS) 100 MG tablet, Take 1 tablet (100 mg total) by mouth 2 (two) times daily. 1 po bid, Disp: 20 tablet, Rfl: 0   albuterol (PROVENTIL) (2.5 MG/3ML) 0.083% nebulizer solution, Take 3 mLs (2.5 mg total) by nebulization every 6 (six) hours as needed for wheezing or shortness of breath., Disp: 150 mL, Rfl: 1   AMBULATORY NON FORMULARY MEDICATION, Please provide lightweight transport chair.  Patient unable to propel himself in standard manual wheelchair.  Wife has trouble pushing standard wheelchair and moving in/out vehicle.  Diagnosis: Parkinsonism- G20, Gait Abnormality-R26.9, Disp: 1 Device, Rfl: 0   budesonide (PULMICORT) 0.5 MG/2ML nebulizer solution, Take 0.5 mg by nebulization daily as needed., Disp: 100 mL, Rfl: 10   carbidopa-levodopa (SINEMET IR) 25-100 MG tablet, Take 2 tablets by mouth 3 (three) times daily., Disp: 540 tablet, Rfl: 3   clotrimazole-betamethasone (LOTRISONE) cream, Apply 1 Application topically daily., Disp: 45 g, Rfl: 3   donepezil (ARICEPT) 5 MG tablet, Take 1 tablet (5 mg total) by mouth at bedtime., Disp: 90 tablet, Rfl: 1   dorzolamide-timolol (COSOPT) 2-0.5 % ophthalmic solution, Place 1 drop into the right eye 2 (two) times daily., Disp: , Rfl:    Dulaglutide  (TRULICITY) 1.5 ZD/6.3OV SOPN, INJECT 1.5 MG(0.5ML) UNDER THE SKIN ONCE A WEEK., Disp: , Rfl:    fenofibrate 160 MG tablet, Take 1 tablet (160 mg total) by mouth daily., Disp: 90 tablet, Rfl: 1   FEROSUL 325 (65 Fe) MG tablet, Take 1 tablet (325 mg total) by mouth daily., Disp: 90 tablet, Rfl: 1   fexofenadine-pseudoephedrine (ALLEGRA-D) 60-120 MG 12 hr tablet, Take 1 tablet by mouth daily as needed., Disp: , Rfl:    fluticasone (FLONASE) 50 MCG/ACT nasal spray, Place 2 sprays into both nostrils daily., Disp: 1 g, Rfl: 0   fluticasone (FLOVENT HFA) 44 MCG/ACT inhaler, Inhale 2 puffs into the lungs in the morning and at bedtime., Disp: 1 each, Rfl: 12   furosemide (LASIX) 20 MG tablet, TAKE 1 TABLET BY MOUTH DAILY, Disp: 90 tablet, Rfl: 3   insulin degludec (TRESIBA FLEXTOUCH) 200 UNIT/ML FlexTouch Pen, Inject 120 Units into the skin. , Disp: , Rfl:    Insulin Pen Needle (BD PEN NEEDLE NANO U/F) 32G X 4 MM MISC, USE WITH LEVEMIR FLEXTOUCH DAILY, Disp: 200 each, Rfl: 2   latanoprost (XALATAN) 0.005 % ophthalmic solution, Place 1 drop into both eyes at bedtime., Disp: , Rfl:    midodrine (PROAMATINE) 5 MG tablet, Take 0.5 tablets (2.5 mg total) by mouth 3 (three) times daily., Disp: 135 tablet, Rfl: 1   Misc. Devices MISC, DX: Severe sleep apnea Start bipap 16/12 cm. Water with mask and supplies, Disp: , Rfl:    Multiple Vitamins-Minerals (MULTIVITAMIN ADULT, MINERALS, PO), Take 1 tablet by mouth., Disp: , Rfl:    NONFORMULARY OR  COMPOUNDED ITEM, Please provide rolling walker.  Diagnosis: Gait instability, Disp: 1 each, Rfl: 0   nystatin-triamcinolone (MYCOLOG II) cream, APPLY TO AFFECTED AREA(S)  TOPICALLY TWICE DAILY, Disp: 30 g, Rfl: 1   omeprazole (PRILOSEC) 20 MG capsule, TAKE 1 CAPSULE BY MOUTH DAILY, Disp: 90 capsule, Rfl: 3   pregabalin (LYRICA) 200 MG capsule, Take 200 mg by mouth 2 (two) times daily. , Disp: , Rfl:    Probiotic Product (PROBIOTIC PO), Take 1 tablet by mouth daily., Disp: ,  Rfl:    rosuvastatin (CRESTOR) 10 MG tablet, TAKE 1 TABLET BY MOUTH DAILY, Disp: 90 tablet, Rfl: 3   sertraline (ZOLOFT) 50 MG tablet, Take 50 mg by mouth daily., Disp: , Rfl:    STOOL SOFTENER/LAXATIVE 50-8.6 MG tablet, Take 2 tablets by mouth daily., Disp: 180 tablet, Rfl: 1   tadalafil (CIALIS) 20 MG tablet, Take 0.5-1 tablets (10-20 mg total) by mouth every other day as needed (ED). Replaces sildenafil as tadalafil recently approved., Disp: 45 tablet, Rfl: 3   tamsulosin (FLOMAX) 0.4 MG CAPS capsule, TAKE 1 CAPSULE BY MOUTH DAILY, Disp: 90 capsule, Rfl: 3   timolol (TIMOPTIC) 0.5 % ophthalmic solution, Place 1 drop into the right eye every morning., Disp: , Rfl:    valACYclovir (VALTREX) 500 MG tablet, Take 500 mg by mouth daily., Disp: , Rfl:    WIXELA INHUB 250-50 MCG/ACT AEPB, USE 1 INHALATION BY MOUTH IN THE MORNING AND AT BEDTIME, Disp: 180 each, Rfl: 3   Medications ordered in this encounter:  Meds ordered this encounter  Medications   doxycycline (VIBRA-TABS) 100 MG tablet    Sig: Take 1 tablet (100 mg total) by mouth 2 (two) times daily. 1 po bid    Dispense:  20 tablet    Refill:  0    Order Specific Question:   Supervising Provider    Answer:   Chase Picket [id number]     *If you need refills on other medications prior to your next appointment, please contact your pharmacy*  Follow-Up: Call back or seek an in-person evaluation if the symptoms worsen or if the condition fails to improve as anticipated.  [place] [phone]  Other Instructions 1. Take meds as prescribed 2. Use a cool mist humidifier especially during the winter months and when heat has been humid. 3. Use saline nose sprays frequently 4. Saline irrigations of the nose can be very helpful if done frequently.  * 4X daily for 1 week*  * Use of a nettie pot can be helpful with this. Follow directions with this* 5. Drink plenty of fluids 6. Keep thermostat turn down low 7.For any  cough or congestion- delsym 8. For fever or aces or pains- take tylenol or ibuprofen appropriate for age and weight.  * for fevers greater than 101 orally you may alternate ibuprofen and tylenol every  3 hours.      If you have been instructed to have an in-person evaluation today at a local Urgent Care facility, please use the link below. It will take you to a list of all of our available [facility], including address, phone number and hours of operation. Please do not delay care.  [facility]  If you or a family member do not have a primary care provider, use the link below to schedule a visit and establish care. When you choose a [facility] primary care physician or advanced practice provider, you gain a long-term partner  in health. Find a Primary Care Provider  Learn more about [facility]'s in-office and virtual care options: Advance Now

## 2022-10-10 ENCOUNTER — Telehealth: Payer: Self-pay

## 2022-10-10 ENCOUNTER — Telehealth: Payer: Medicare Other | Admitting: Family Medicine

## 2022-10-10 DIAGNOSIS — F419 Anxiety disorder, unspecified: Secondary | ICD-10-CM | POA: Diagnosis not present

## 2022-10-10 DIAGNOSIS — K219 Gastro-esophageal reflux disease without esophagitis: Secondary | ICD-10-CM | POA: Diagnosis not present

## 2022-10-10 DIAGNOSIS — Z85118 Personal history of other malignant neoplasm of bronchus and lung: Secondary | ICD-10-CM | POA: Diagnosis not present

## 2022-10-10 DIAGNOSIS — N401 Enlarged prostate with lower urinary tract symptoms: Secondary | ICD-10-CM | POA: Diagnosis not present

## 2022-10-10 DIAGNOSIS — Z9181 History of falling: Secondary | ICD-10-CM | POA: Diagnosis not present

## 2022-10-10 DIAGNOSIS — G4733 Obstructive sleep apnea (adult) (pediatric): Secondary | ICD-10-CM | POA: Diagnosis not present

## 2022-10-10 DIAGNOSIS — M1990 Unspecified osteoarthritis, unspecified site: Secondary | ICD-10-CM | POA: Diagnosis not present

## 2022-10-10 DIAGNOSIS — Z466 Encounter for fitting and adjustment of urinary device: Secondary | ICD-10-CM | POA: Diagnosis not present

## 2022-10-10 DIAGNOSIS — N183 Chronic kidney disease, stage 3 unspecified: Secondary | ICD-10-CM | POA: Diagnosis not present

## 2022-10-10 DIAGNOSIS — R338 Other retention of urine: Secondary | ICD-10-CM | POA: Diagnosis not present

## 2022-10-10 DIAGNOSIS — G7281 Critical illness myopathy: Secondary | ICD-10-CM | POA: Diagnosis not present

## 2022-10-10 DIAGNOSIS — D509 Iron deficiency anemia, unspecified: Secondary | ICD-10-CM | POA: Diagnosis not present

## 2022-10-10 DIAGNOSIS — E1142 Type 2 diabetes mellitus with diabetic polyneuropathy: Secondary | ICD-10-CM | POA: Diagnosis not present

## 2022-10-10 DIAGNOSIS — H409 Unspecified glaucoma: Secondary | ICD-10-CM | POA: Diagnosis not present

## 2022-10-10 DIAGNOSIS — Z6835 Body mass index (BMI) 35.0-35.9, adult: Secondary | ICD-10-CM | POA: Diagnosis not present

## 2022-10-10 DIAGNOSIS — G20C Parkinsonism, unspecified: Secondary | ICD-10-CM | POA: Diagnosis not present

## 2022-10-10 DIAGNOSIS — J189 Pneumonia, unspecified organism: Secondary | ICD-10-CM | POA: Diagnosis not present

## 2022-10-10 DIAGNOSIS — I129 Hypertensive chronic kidney disease with stage 1 through stage 4 chronic kidney disease, or unspecified chronic kidney disease: Secondary | ICD-10-CM | POA: Diagnosis not present

## 2022-10-10 DIAGNOSIS — J40 Bronchitis, not specified as acute or chronic: Secondary | ICD-10-CM | POA: Diagnosis not present

## 2022-10-10 DIAGNOSIS — Z794 Long term (current) use of insulin: Secondary | ICD-10-CM | POA: Diagnosis not present

## 2022-10-10 DIAGNOSIS — C61 Malignant neoplasm of prostate: Secondary | ICD-10-CM | POA: Diagnosis not present

## 2022-10-10 DIAGNOSIS — E1122 Type 2 diabetes mellitus with diabetic chronic kidney disease: Secondary | ICD-10-CM | POA: Diagnosis not present

## 2022-10-10 DIAGNOSIS — E782 Mixed hyperlipidemia: Secondary | ICD-10-CM | POA: Diagnosis not present

## 2022-10-10 DIAGNOSIS — N3001 Acute cystitis with hematuria: Secondary | ICD-10-CM | POA: Diagnosis not present

## 2022-10-10 NOTE — Telephone Encounter (Signed)
Pt has cancelled both PT and OT for this week due to COVID + status of both him and spouse.   VO's granted to move PT re-assessment to next week.

## 2022-10-14 DIAGNOSIS — E1122 Type 2 diabetes mellitus with diabetic chronic kidney disease: Secondary | ICD-10-CM | POA: Diagnosis not present

## 2022-10-14 DIAGNOSIS — J189 Pneumonia, unspecified organism: Secondary | ICD-10-CM | POA: Diagnosis not present

## 2022-10-14 DIAGNOSIS — I129 Hypertensive chronic kidney disease with stage 1 through stage 4 chronic kidney disease, or unspecified chronic kidney disease: Secondary | ICD-10-CM | POA: Diagnosis not present

## 2022-10-14 DIAGNOSIS — N3001 Acute cystitis with hematuria: Secondary | ICD-10-CM | POA: Diagnosis not present

## 2022-10-14 DIAGNOSIS — G7281 Critical illness myopathy: Secondary | ICD-10-CM | POA: Diagnosis not present

## 2022-10-14 DIAGNOSIS — N183 Chronic kidney disease, stage 3 unspecified: Secondary | ICD-10-CM | POA: Diagnosis not present

## 2022-10-15 ENCOUNTER — Telehealth: Payer: Self-pay

## 2022-10-15 DIAGNOSIS — G7281 Critical illness myopathy: Secondary | ICD-10-CM | POA: Diagnosis not present

## 2022-10-15 DIAGNOSIS — J189 Pneumonia, unspecified organism: Secondary | ICD-10-CM | POA: Diagnosis not present

## 2022-10-15 DIAGNOSIS — E1122 Type 2 diabetes mellitus with diabetic chronic kidney disease: Secondary | ICD-10-CM | POA: Diagnosis not present

## 2022-10-15 DIAGNOSIS — I129 Hypertensive chronic kidney disease with stage 1 through stage 4 chronic kidney disease, or unspecified chronic kidney disease: Secondary | ICD-10-CM | POA: Diagnosis not present

## 2022-10-15 DIAGNOSIS — N3001 Acute cystitis with hematuria: Secondary | ICD-10-CM | POA: Diagnosis not present

## 2022-10-15 DIAGNOSIS — N183 Chronic kidney disease, stage 3 unspecified: Secondary | ICD-10-CM | POA: Diagnosis not present

## 2022-10-15 NOTE — Telephone Encounter (Signed)
Received vm from [facility] requesting VO's to treat a new buttock wound. [phone]).   Please advise.

## 2022-10-15 NOTE — Telephone Encounter (Signed)
Dora for VO for treatment.

## 2022-10-15 NOTE — Telephone Encounter (Signed)
Travis @ [facility] called after completing reassessment today. He would like to increase the time for therapy to 1 visit x 3 weeks.   Please advise.

## 2022-10-15 NOTE — Telephone Encounter (Signed)
Ok to approve 

## 2022-10-16 DIAGNOSIS — I951 Orthostatic hypotension: Secondary | ICD-10-CM | POA: Diagnosis not present

## 2022-10-16 DIAGNOSIS — C61 Malignant neoplasm of prostate: Secondary | ICD-10-CM | POA: Diagnosis not present

## 2022-10-16 DIAGNOSIS — G20C Parkinsonism, unspecified: Secondary | ICD-10-CM | POA: Diagnosis not present

## 2022-10-16 DIAGNOSIS — C3490 Malignant neoplasm of unspecified part of unspecified bronchus or lung: Secondary | ICD-10-CM | POA: Diagnosis not present

## 2022-10-16 DIAGNOSIS — E119 Type 2 diabetes mellitus without complications: Secondary | ICD-10-CM | POA: Diagnosis not present

## 2022-10-16 DIAGNOSIS — G232 Striatonigral degeneration: Secondary | ICD-10-CM | POA: Diagnosis not present

## 2022-10-16 DIAGNOSIS — F32A Depression, unspecified: Secondary | ICD-10-CM | POA: Diagnosis not present

## 2022-10-16 DIAGNOSIS — Z794 Long term (current) use of insulin: Secondary | ICD-10-CM | POA: Diagnosis not present

## 2022-10-16 DIAGNOSIS — K5904 Chronic idiopathic constipation: Secondary | ICD-10-CM | POA: Diagnosis not present

## 2022-10-16 DIAGNOSIS — K117 Disturbances of salivary secretion: Secondary | ICD-10-CM | POA: Diagnosis not present

## 2022-10-16 DIAGNOSIS — R2689 Other abnormalities of gait and mobility: Secondary | ICD-10-CM | POA: Diagnosis not present

## 2022-10-16 DIAGNOSIS — F419 Anxiety disorder, unspecified: Secondary | ICD-10-CM | POA: Diagnosis not present

## 2022-10-16 DIAGNOSIS — H5461 Unqualified visual loss, right eye, normal vision left eye: Secondary | ICD-10-CM | POA: Diagnosis not present

## 2022-10-16 DIAGNOSIS — G4752 REM sleep behavior disorder: Secondary | ICD-10-CM | POA: Diagnosis not present

## 2022-10-23 DIAGNOSIS — G7281 Critical illness myopathy: Secondary | ICD-10-CM | POA: Diagnosis not present

## 2022-10-23 DIAGNOSIS — I129 Hypertensive chronic kidney disease with stage 1 through stage 4 chronic kidney disease, or unspecified chronic kidney disease: Secondary | ICD-10-CM | POA: Diagnosis not present

## 2022-10-23 DIAGNOSIS — N183 Chronic kidney disease, stage 3 unspecified: Secondary | ICD-10-CM | POA: Diagnosis not present

## 2022-10-23 DIAGNOSIS — E1122 Type 2 diabetes mellitus with diabetic chronic kidney disease: Secondary | ICD-10-CM | POA: Diagnosis not present

## 2022-10-23 DIAGNOSIS — N3001 Acute cystitis with hematuria: Secondary | ICD-10-CM | POA: Diagnosis not present

## 2022-10-23 DIAGNOSIS — J189 Pneumonia, unspecified organism: Secondary | ICD-10-CM | POA: Diagnosis not present

## 2022-10-24 ENCOUNTER — Ambulatory Visit (INDEPENDENT_AMBULATORY_CARE_PROVIDER_SITE_OTHER): Payer: Medicare Other | Admitting: Sports Medicine

## 2022-10-24 VITALS — BP 105/67 | HR 88

## 2022-10-24 DIAGNOSIS — J189 Pneumonia, unspecified organism: Secondary | ICD-10-CM

## 2022-10-24 DIAGNOSIS — R350 Frequency of micturition: Secondary | ICD-10-CM

## 2022-10-24 DIAGNOSIS — L03317 Cellulitis of buttock: Secondary | ICD-10-CM

## 2022-10-24 DIAGNOSIS — R3915 Urgency of urination: Secondary | ICD-10-CM

## 2022-10-24 LAB — POCT URINALYSIS DIP (CLINITEK)
Bilirubin, UA: NEGATIVE
Glucose, UA: NEGATIVE mg/dL
Ketones, POC UA: NEGATIVE mg/dL
Nitrite, UA: NEGATIVE
POC PROTEIN,UA: 100 — AB
Spec Grav, UA: 1.025 (ref 1.010–1.025)
Urobilinogen, UA: 0.2 E.U./dL
pH, UA: 7 (ref 5.0–8.0)

## 2022-10-24 MED ORDER — SULFAMETHOXAZOLE-TRIMETHOPRIM 800-160 MG PO TABS: 1.0000 | ORAL_TABLET | Freq: Two times a day (BID) | ORAL | 0 refills | Status: DC

## 2022-10-24 MED ORDER — BENZONATATE 200 MG PO CAPS: 200.0000 mg | ORAL_CAPSULE | Freq: Three times a day (TID) | ORAL | 0 refills | Status: DC | PRN

## 2022-10-24 MED ORDER — LEVOFLOXACIN 750 MG PO TABS: 750.0000 mg | ORAL_TABLET | Freq: Every day | ORAL | 0 refills | Status: DC

## 2022-10-24 NOTE — Assessment & Plan Note (Addendum)
This is a very pleasant 67 year old male, he does have some urinary retention and in and out caths himself frequently. He also has a history of prostate cancer, currently being surveilled. Recently over the past week or so he has had increasing pain with urination, hesitancy, burning and discharge. We did a urinalysis today that was positive for blood and leukocytes, this will be sent for culture, I will also go ahead and get him treated for prostatitis with Levaquin for at least 14 days. Of note he is not taking an SGLT2 inhibitor.  Update:  Urine culture is growing out Enterococcus, unfortunately we will have to change the antibiotic, we will switch it to amoxicillin.

## 2022-10-24 NOTE — Assessment & Plan Note (Signed)
History of lung cancer, status post lobar resection, chronic cough for months to years, he does have an appointment coming up with pulmonology. I am going to give him some Tessalon Perles as these have helped in the past, he understands that I am not going to be able to render a definitive solution here and that it really should be accomplished in the pulmonologists setting.

## 2022-10-24 NOTE — Assessment & Plan Note (Addendum)
Also has a reddish painful rash in the intergluteal cleft but sparing the valley. There is some scaling and lichenification. It is minimally tender to palpation with no palpable fluid collections. I did palpate his perineum as well, there is only minimal erythema but no palpable collections and no pain, he does have sufficient sensation in the area. I do suspect this is more of a cellulitis, we will add Septra, he recently was on Doxy which did not help. He will also do a barrier cream in the area. We will have a high index of suspicion and a low threshold for doing a CT scan of his pelvis with and without IV contrast as Fourniers gangrene can present like this. If he does not improve through the weekend with the antibiotic we will do a CT pelvis with and without contrast.

## 2022-10-24 NOTE — Progress Notes (Addendum)
    Procedures performed today:    None.  Independent interpretation of notes and tests performed by another provider:   None.  Brief History, Exam, Impression, and Recommendations:    Urinary urgency This is a very pleasant 67 year old male, he does have some urinary retention and in and out caths himself frequently. He also has a history of prostate cancer, currently being surveilled. Recently over the past week or so he has had increasing pain with urination, hesitancy, burning and discharge. We did a urinalysis today that was positive for blood and leukocytes, this will be sent for culture, I will also go ahead and get him treated for prostatitis with Levaquin for at least 14 days. Of note he is not taking an SGLT2 inhibitor.  Update:  Urine culture is growing out Enterococcus, unfortunately we will have to change the antibiotic, we will switch it to amoxicillin.   Cellulitis of buttock Also has a reddish painful rash in the intergluteal cleft but sparing the valley. There is some scaling and lichenification. It is minimally tender to palpation with no palpable fluid collections. I did palpate his perineum as well, there is only minimal erythema but no palpable collections and no pain, he does have sufficient sensation in the area. I do suspect this is more of a cellulitis, we will add Septra, he recently was on Doxy which did not help. He will also do a barrier cream in the area. We will have a high index of suspicion and a low threshold for doing a CT scan of his pelvis with and without IV contrast as Fourniers gangrene can present like this. If he does not improve through the weekend with the antibiotic we will do a CT pelvis with and without contrast.  Postobstructive pneumonia History of lung cancer, status post lobar resection, chronic cough for months to years, he does have an appointment coming up with pulmonology. I am going to give him some Tessalon Perles as these  have helped in the past, he understands that I am not going to be able to render a definitive solution here and that it really should be accomplished in the pulmonologists setting.    ____________________________________________ Gwen Her. Dianah Field, M.D., ABFM., CAQSM., AME. Primary Care and Sports Medicine [facility]  Adjunct Professor of [employer] of Medicine  Risk manager

## 2022-10-25 DIAGNOSIS — R0602 Shortness of breath: Secondary | ICD-10-CM | POA: Diagnosis not present

## 2022-10-25 DIAGNOSIS — N183 Chronic kidney disease, stage 3 unspecified: Secondary | ICD-10-CM | POA: Diagnosis not present

## 2022-10-25 DIAGNOSIS — I129 Hypertensive chronic kidney disease with stage 1 through stage 4 chronic kidney disease, or unspecified chronic kidney disease: Secondary | ICD-10-CM | POA: Diagnosis not present

## 2022-10-25 DIAGNOSIS — C3492 Malignant neoplasm of unspecified part of left bronchus or lung: Secondary | ICD-10-CM | POA: Diagnosis not present

## 2022-10-25 DIAGNOSIS — J189 Pneumonia, unspecified organism: Secondary | ICD-10-CM | POA: Diagnosis not present

## 2022-10-25 DIAGNOSIS — J4540 Moderate persistent asthma, uncomplicated: Secondary | ICD-10-CM | POA: Diagnosis not present

## 2022-10-25 DIAGNOSIS — F17211 Nicotine dependence, cigarettes, in remission: Secondary | ICD-10-CM | POA: Diagnosis not present

## 2022-10-25 DIAGNOSIS — G4733 Obstructive sleep apnea (adult) (pediatric): Secondary | ICD-10-CM | POA: Diagnosis not present

## 2022-10-25 DIAGNOSIS — E1122 Type 2 diabetes mellitus with diabetic chronic kidney disease: Secondary | ICD-10-CM | POA: Diagnosis not present

## 2022-10-25 DIAGNOSIS — Z9989 Dependence on other enabling machines and devices: Secondary | ICD-10-CM | POA: Diagnosis not present

## 2022-10-25 DIAGNOSIS — G7281 Critical illness myopathy: Secondary | ICD-10-CM | POA: Diagnosis not present

## 2022-10-25 DIAGNOSIS — N3001 Acute cystitis with hematuria: Secondary | ICD-10-CM | POA: Diagnosis not present

## 2022-10-26 LAB — URINE CULTURE
MICRO NUMBER:: 14538948
SPECIMEN QUALITY:: ADEQUATE

## 2022-10-26 LAB — C. TRACHOMATIS/N. GONORRHOEAE RNA
C. trachomatis RNA, TMA: NOT DETECTED
N. gonorrhoeae RNA, TMA: NOT DETECTED

## 2022-10-28 MED ORDER — AMOXICILLIN-POT CLAVULANATE 875-125 MG PO TABS: 1.0000 | ORAL_TABLET | Freq: Two times a day (BID) | ORAL | 0 refills | Status: DC

## 2022-10-28 NOTE — Addendum Note (Signed)
Addended by: Silverio Decamp on: 10/28/2022 09:58 AM   Modules accepted: Orders

## 2022-10-29 DIAGNOSIS — J189 Pneumonia, unspecified organism: Secondary | ICD-10-CM | POA: Diagnosis not present

## 2022-10-29 DIAGNOSIS — N3001 Acute cystitis with hematuria: Secondary | ICD-10-CM | POA: Diagnosis not present

## 2022-10-29 DIAGNOSIS — G7281 Critical illness myopathy: Secondary | ICD-10-CM | POA: Diagnosis not present

## 2022-10-29 DIAGNOSIS — N183 Chronic kidney disease, stage 3 unspecified: Secondary | ICD-10-CM | POA: Diagnosis not present

## 2022-10-29 DIAGNOSIS — I129 Hypertensive chronic kidney disease with stage 1 through stage 4 chronic kidney disease, or unspecified chronic kidney disease: Secondary | ICD-10-CM | POA: Diagnosis not present

## 2022-10-29 DIAGNOSIS — E1122 Type 2 diabetes mellitus with diabetic chronic kidney disease: Secondary | ICD-10-CM | POA: Diagnosis not present

## 2022-10-31 DIAGNOSIS — I129 Hypertensive chronic kidney disease with stage 1 through stage 4 chronic kidney disease, or unspecified chronic kidney disease: Secondary | ICD-10-CM | POA: Diagnosis not present

## 2022-10-31 DIAGNOSIS — N3001 Acute cystitis with hematuria: Secondary | ICD-10-CM | POA: Diagnosis not present

## 2022-10-31 DIAGNOSIS — E1122 Type 2 diabetes mellitus with diabetic chronic kidney disease: Secondary | ICD-10-CM | POA: Diagnosis not present

## 2022-10-31 DIAGNOSIS — J189 Pneumonia, unspecified organism: Secondary | ICD-10-CM | POA: Diagnosis not present

## 2022-10-31 DIAGNOSIS — N183 Chronic kidney disease, stage 3 unspecified: Secondary | ICD-10-CM | POA: Diagnosis not present

## 2022-10-31 DIAGNOSIS — G7281 Critical illness myopathy: Secondary | ICD-10-CM | POA: Diagnosis not present

## 2022-11-01 ENCOUNTER — Encounter: Payer: Self-pay | Admitting: Sports Medicine

## 2022-11-01 ENCOUNTER — Ambulatory Visit (INDEPENDENT_AMBULATORY_CARE_PROVIDER_SITE_OTHER): Payer: Medicare Other | Admitting: Sports Medicine

## 2022-11-01 VITALS — BP 119/73 | HR 82

## 2022-11-01 DIAGNOSIS — L03317 Cellulitis of buttock: Secondary | ICD-10-CM | POA: Diagnosis not present

## 2022-11-01 DIAGNOSIS — N3001 Acute cystitis with hematuria: Secondary | ICD-10-CM | POA: Diagnosis not present

## 2022-11-01 NOTE — Assessment & Plan Note (Signed)
Randall Fisher returns, he had intergluteal cleft cellulitis, he had some scaling and lichenification. Minimal tenderness but no palpable fluid collections. We also checked his perineum, minimal erythema but no palpable collections and no pain to suggest Fournier's gangrene. We added barrier cream as well as Septra, the barrier cream alone has improved his symptoms considerably, they did inadvertently stop his Septra, they will go ahead and restart the Septra, continue barrier cream and can return to see me as needed for this.

## 2022-11-01 NOTE — Assessment & Plan Note (Signed)
Randall Fisher also has history of urinary retention, he straight caths himself frequently. Also has a history of prostate cancer currently being surveilled. A week prior to his last appointment with me he had increasing pain with urination, hesitancy, burning, urinalysis at the time was positive for blood and leukocytes, these were sent for culture and we added Levaquin, culture did come back growing out Enterococcus sensitive to beta-lactams such as ampicillin and amoxicillin, we switched him to Augmentin, and urinary symptoms have resolved. I have encouraged them to request urine cultures anytime he has his urine tested for UTI symptoms.

## 2022-11-01 NOTE — Progress Notes (Signed)
    Procedures performed today:    None.  Independent interpretation of notes and tests performed by another provider:   None.  Brief History, Exam, Impression, and Recommendations:    Cellulitis of buttock Randall Fisher returns, he had intergluteal cleft cellulitis, he had some scaling and lichenification. Minimal tenderness but no palpable fluid collections. We also checked his perineum, minimal erythema but no palpable collections and no pain to suggest Fournier's gangrene. We added barrier cream as well as Septra, the barrier cream alone has improved his symptoms considerably, they did inadvertently stop his Septra, they will go ahead and restart the Septra, continue barrier cream and can return to see me as needed for this.  Urinary tract infection Randall Fisher also has history of urinary retention, he straight caths himself frequently. Also has a history of prostate cancer currently being surveilled. A week prior to his last appointment with me he had increasing pain with urination, hesitancy, burning, urinalysis at the time was positive for blood and leukocytes, these were sent for culture and we added Levaquin, culture did come back growing out Enterococcus sensitive to beta-lactams such as ampicillin and amoxicillin, we switched him to Augmentin, and urinary symptoms have resolved. I have encouraged them to request urine cultures anytime he has his urine tested for UTI symptoms.    ____________________________________________ Randall Fisher, M.D., ABFM., CAQSM., AME. 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

## 2022-11-04 DIAGNOSIS — G4733 Obstructive sleep apnea (adult) (pediatric): Secondary | ICD-10-CM | POA: Diagnosis not present

## 2022-11-05 DIAGNOSIS — I129 Hypertensive chronic kidney disease with stage 1 through stage 4 chronic kidney disease, or unspecified chronic kidney disease: Secondary | ICD-10-CM | POA: Diagnosis not present

## 2022-11-05 DIAGNOSIS — G7281 Critical illness myopathy: Secondary | ICD-10-CM | POA: Diagnosis not present

## 2022-11-05 DIAGNOSIS — E1122 Type 2 diabetes mellitus with diabetic chronic kidney disease: Secondary | ICD-10-CM | POA: Diagnosis not present

## 2022-11-05 DIAGNOSIS — J189 Pneumonia, unspecified organism: Secondary | ICD-10-CM | POA: Diagnosis not present

## 2022-11-05 DIAGNOSIS — N183 Chronic kidney disease, stage 3 unspecified: Secondary | ICD-10-CM | POA: Diagnosis not present

## 2022-11-05 DIAGNOSIS — N3001 Acute cystitis with hematuria: Secondary | ICD-10-CM | POA: Diagnosis not present

## 2022-11-06 DIAGNOSIS — I129 Hypertensive chronic kidney disease with stage 1 through stage 4 chronic kidney disease, or unspecified chronic kidney disease: Secondary | ICD-10-CM | POA: Diagnosis not present

## 2022-11-06 DIAGNOSIS — E1122 Type 2 diabetes mellitus with diabetic chronic kidney disease: Secondary | ICD-10-CM | POA: Diagnosis not present

## 2022-11-06 DIAGNOSIS — N3001 Acute cystitis with hematuria: Secondary | ICD-10-CM | POA: Diagnosis not present

## 2022-11-06 DIAGNOSIS — N183 Chronic kidney disease, stage 3 unspecified: Secondary | ICD-10-CM | POA: Diagnosis not present

## 2022-11-06 DIAGNOSIS — G7281 Critical illness myopathy: Secondary | ICD-10-CM | POA: Diagnosis not present

## 2022-11-06 DIAGNOSIS — J189 Pneumonia, unspecified organism: Secondary | ICD-10-CM | POA: Diagnosis not present

## 2022-11-07 NOTE — Telephone Encounter (Signed)
Received request from [facility] @ [facility] for VO's. PT 1 x 8wk.  Fall. Hit his head. No injury. He was light headed 104/60 then 92/58 upon standing.   VO's granted per Dr. Zigmund Daniel.

## 2022-11-07 NOTE — Telephone Encounter (Signed)
Continue to monitor BP closely.  Stay well hydrated.  If BP remains low please contact clinic.

## 2022-11-09 DIAGNOSIS — F419 Anxiety disorder, unspecified: Secondary | ICD-10-CM | POA: Diagnosis not present

## 2022-11-09 DIAGNOSIS — Z466 Encounter for fitting and adjustment of urinary device: Secondary | ICD-10-CM | POA: Diagnosis not present

## 2022-11-09 DIAGNOSIS — J40 Bronchitis, not specified as acute or chronic: Secondary | ICD-10-CM | POA: Diagnosis not present

## 2022-11-09 DIAGNOSIS — N183 Chronic kidney disease, stage 3 unspecified: Secondary | ICD-10-CM | POA: Diagnosis not present

## 2022-11-09 DIAGNOSIS — I129 Hypertensive chronic kidney disease with stage 1 through stage 4 chronic kidney disease, or unspecified chronic kidney disease: Secondary | ICD-10-CM | POA: Diagnosis not present

## 2022-11-09 DIAGNOSIS — G7281 Critical illness myopathy: Secondary | ICD-10-CM | POA: Diagnosis not present

## 2022-11-09 DIAGNOSIS — K219 Gastro-esophageal reflux disease without esophagitis: Secondary | ICD-10-CM | POA: Diagnosis not present

## 2022-11-09 DIAGNOSIS — N401 Enlarged prostate with lower urinary tract symptoms: Secondary | ICD-10-CM | POA: Diagnosis not present

## 2022-11-09 DIAGNOSIS — M1990 Unspecified osteoarthritis, unspecified site: Secondary | ICD-10-CM | POA: Diagnosis not present

## 2022-11-09 DIAGNOSIS — C61 Malignant neoplasm of prostate: Secondary | ICD-10-CM | POA: Diagnosis not present

## 2022-11-09 DIAGNOSIS — E782 Mixed hyperlipidemia: Secondary | ICD-10-CM | POA: Diagnosis not present

## 2022-11-09 DIAGNOSIS — Z6835 Body mass index (BMI) 35.0-35.9, adult: Secondary | ICD-10-CM | POA: Diagnosis not present

## 2022-11-09 DIAGNOSIS — Z85118 Personal history of other malignant neoplasm of bronchus and lung: Secondary | ICD-10-CM | POA: Diagnosis not present

## 2022-11-09 DIAGNOSIS — E1122 Type 2 diabetes mellitus with diabetic chronic kidney disease: Secondary | ICD-10-CM | POA: Diagnosis not present

## 2022-11-09 DIAGNOSIS — G20C Parkinsonism, unspecified: Secondary | ICD-10-CM | POA: Diagnosis not present

## 2022-11-09 DIAGNOSIS — H409 Unspecified glaucoma: Secondary | ICD-10-CM | POA: Diagnosis not present

## 2022-11-09 DIAGNOSIS — Z9181 History of falling: Secondary | ICD-10-CM | POA: Diagnosis not present

## 2022-11-09 DIAGNOSIS — E1142 Type 2 diabetes mellitus with diabetic polyneuropathy: Secondary | ICD-10-CM | POA: Diagnosis not present

## 2022-11-09 DIAGNOSIS — G4733 Obstructive sleep apnea (adult) (pediatric): Secondary | ICD-10-CM | POA: Diagnosis not present

## 2022-11-09 DIAGNOSIS — R338 Other retention of urine: Secondary | ICD-10-CM | POA: Diagnosis not present

## 2022-11-09 DIAGNOSIS — Z794 Long term (current) use of insulin: Secondary | ICD-10-CM | POA: Diagnosis not present

## 2022-11-09 DIAGNOSIS — D509 Iron deficiency anemia, unspecified: Secondary | ICD-10-CM | POA: Diagnosis not present

## 2022-11-12 DIAGNOSIS — G20C Parkinsonism, unspecified: Secondary | ICD-10-CM | POA: Diagnosis not present

## 2022-11-12 DIAGNOSIS — E1122 Type 2 diabetes mellitus with diabetic chronic kidney disease: Secondary | ICD-10-CM | POA: Diagnosis not present

## 2022-11-12 DIAGNOSIS — E1142 Type 2 diabetes mellitus with diabetic polyneuropathy: Secondary | ICD-10-CM | POA: Diagnosis not present

## 2022-11-12 DIAGNOSIS — N183 Chronic kidney disease, stage 3 unspecified: Secondary | ICD-10-CM | POA: Diagnosis not present

## 2022-11-12 DIAGNOSIS — G7281 Critical illness myopathy: Secondary | ICD-10-CM | POA: Diagnosis not present

## 2022-11-12 DIAGNOSIS — I129 Hypertensive chronic kidney disease with stage 1 through stage 4 chronic kidney disease, or unspecified chronic kidney disease: Secondary | ICD-10-CM | POA: Diagnosis not present

## 2022-11-13 ENCOUNTER — Telehealth: Payer: Self-pay

## 2022-11-13 NOTE — Telephone Encounter (Signed)
Received callf rom Randall Fisher reporting a fall.   [name] fell while transferring from roll-lator to chair. No injuries noted. Fall happened in presence of spouse and PT.

## 2022-11-13 NOTE — Telephone Encounter (Signed)
Noted  

## 2022-11-14 DIAGNOSIS — Z23 Encounter for immunization: Secondary | ICD-10-CM | POA: Diagnosis not present

## 2022-11-14 DIAGNOSIS — S0083XA Contusion of other part of head, initial encounter: Secondary | ICD-10-CM | POA: Diagnosis not present

## 2022-11-14 DIAGNOSIS — Y998 Other external cause status: Secondary | ICD-10-CM | POA: Diagnosis not present

## 2022-11-14 DIAGNOSIS — R58 Hemorrhage, not elsewhere classified: Secondary | ICD-10-CM | POA: Diagnosis not present

## 2022-11-14 DIAGNOSIS — S0993XA Unspecified injury of face, initial encounter: Secondary | ICD-10-CM | POA: Diagnosis not present

## 2022-11-14 DIAGNOSIS — S0081XA Abrasion of other part of head, initial encounter: Secondary | ICD-10-CM | POA: Diagnosis not present

## 2022-11-14 DIAGNOSIS — S0031XA Abrasion of nose, initial encounter: Secondary | ICD-10-CM | POA: Diagnosis not present

## 2022-11-14 DIAGNOSIS — R2689 Other abnormalities of gait and mobility: Secondary | ICD-10-CM | POA: Diagnosis not present

## 2022-11-14 DIAGNOSIS — Z125 Encounter for screening for malignant neoplasm of prostate: Secondary | ICD-10-CM | POA: Diagnosis not present

## 2022-11-14 DIAGNOSIS — I672 Cerebral atherosclerosis: Secondary | ICD-10-CM | POA: Diagnosis not present

## 2022-11-14 DIAGNOSIS — S0003XA Contusion of scalp, initial encounter: Secondary | ICD-10-CM | POA: Diagnosis not present

## 2022-11-14 DIAGNOSIS — Z7409 Other reduced mobility: Secondary | ICD-10-CM | POA: Diagnosis not present

## 2022-11-14 DIAGNOSIS — W19XXXA Unspecified fall, initial encounter: Secondary | ICD-10-CM | POA: Diagnosis not present

## 2022-11-14 DIAGNOSIS — W01198A Fall on same level from slipping, tripping and stumbling with subsequent striking against other object, initial encounter: Secondary | ICD-10-CM | POA: Diagnosis not present

## 2022-11-14 DIAGNOSIS — M542 Cervicalgia: Secondary | ICD-10-CM | POA: Diagnosis not present

## 2022-11-14 DIAGNOSIS — Z743 Need for continuous supervision: Secondary | ICD-10-CM | POA: Diagnosis not present

## 2022-11-15 ENCOUNTER — Telehealth: Payer: Self-pay | Admitting: General Practice

## 2022-11-15 ENCOUNTER — Other Ambulatory Visit: Payer: Self-pay | Admitting: Family Medicine

## 2022-11-15 DIAGNOSIS — G20C Parkinsonism, unspecified: Secondary | ICD-10-CM

## 2022-11-15 DIAGNOSIS — F09 Unspecified mental disorder due to known physiological condition: Secondary | ICD-10-CM

## 2022-11-15 NOTE — Transitions of Care (Post Inpatient/ED Visit) (Signed)
   11/15/2022  Name: Randall Fisher MRN: [id number] DOB: December 13, 1955  Today's TOC FU Call Status: Today's TOC FU Call Status:: Successful TOC FU Call Competed TOC FU Call Complete Date: 11/15/22  Transition Care Management Follow-up Telephone Call Date of Discharge: 11/14/22 Discharge Facility: Other ([place]) Name of Other ([place]) Discharge Facility: [facility] Type of Discharge: Emergency Department Reason for ED Visit: Other: (Fall) How have you been since you were released from the hospital?: Same Any questions or concerns?: No  Items Reviewed: Did you receive and understand the discharge instructions provided?: Yes Medications obtained and verified?: Yes (Medications Reviewed) Any new allergies since your discharge?: No Dietary orders reviewed?: NA Do you have support at home?: Yes People in Home: spouse  Home Care and Equipment/Supplies: [place] Ordered?: No Any new equipment or medical supplies ordered?: No  Functional Questionnaire: Do you need assistance with bathing/showering or dressing?: No Do you need assistance with meal preparation?: No Do you need assistance with eating?: No Do you have difficulty maintaining continence: No Do you need assistance with getting out of bed/getting out of a chair/moving?: No Do you have difficulty managing or taking your medications?: No  Folllow up appointments reviewed: PCP Follow-up appointment confirmed?: Yes Date of PCP follow-up appointment?: 11/22/22 Follow-up Provider: Dr. [name] Specialist [facility] Follow-up appointment confirmed?: NA Do you need transportation to your follow-up appointment?: No Do you understand care options if your condition(s) worsen?: Yes-patient verbalized understanding    SIGNATURE Tinnie Gens, RN BSN

## 2022-11-18 DIAGNOSIS — C61 Malignant neoplasm of prostate: Secondary | ICD-10-CM | POA: Diagnosis not present

## 2022-11-18 DIAGNOSIS — N3941 Urge incontinence: Secondary | ICD-10-CM | POA: Diagnosis not present

## 2022-11-21 DIAGNOSIS — G20C Parkinsonism, unspecified: Secondary | ICD-10-CM | POA: Diagnosis not present

## 2022-11-21 DIAGNOSIS — E1122 Type 2 diabetes mellitus with diabetic chronic kidney disease: Secondary | ICD-10-CM | POA: Diagnosis not present

## 2022-11-21 DIAGNOSIS — I129 Hypertensive chronic kidney disease with stage 1 through stage 4 chronic kidney disease, or unspecified chronic kidney disease: Secondary | ICD-10-CM | POA: Diagnosis not present

## 2022-11-21 DIAGNOSIS — G7281 Critical illness myopathy: Secondary | ICD-10-CM | POA: Diagnosis not present

## 2022-11-21 DIAGNOSIS — E1142 Type 2 diabetes mellitus with diabetic polyneuropathy: Secondary | ICD-10-CM | POA: Diagnosis not present

## 2022-11-21 DIAGNOSIS — N183 Chronic kidney disease, stage 3 unspecified: Secondary | ICD-10-CM | POA: Diagnosis not present

## 2022-11-22 ENCOUNTER — Ambulatory Visit: Payer: Medicare Other | Admitting: Family Medicine

## 2022-11-22 DIAGNOSIS — E1122 Type 2 diabetes mellitus with diabetic chronic kidney disease: Secondary | ICD-10-CM | POA: Diagnosis not present

## 2022-11-22 DIAGNOSIS — N183 Chronic kidney disease, stage 3 unspecified: Secondary | ICD-10-CM | POA: Diagnosis not present

## 2022-11-22 DIAGNOSIS — C61 Malignant neoplasm of prostate: Secondary | ICD-10-CM | POA: Diagnosis not present

## 2022-11-22 DIAGNOSIS — G7281 Critical illness myopathy: Secondary | ICD-10-CM | POA: Diagnosis not present

## 2022-11-22 DIAGNOSIS — G20C Parkinsonism, unspecified: Secondary | ICD-10-CM | POA: Diagnosis not present

## 2022-11-22 DIAGNOSIS — I129 Hypertensive chronic kidney disease with stage 1 through stage 4 chronic kidney disease, or unspecified chronic kidney disease: Secondary | ICD-10-CM | POA: Diagnosis not present

## 2022-11-22 DIAGNOSIS — E1142 Type 2 diabetes mellitus with diabetic polyneuropathy: Secondary | ICD-10-CM | POA: Diagnosis not present

## 2022-11-26 DIAGNOSIS — G20C Parkinsonism, unspecified: Secondary | ICD-10-CM | POA: Diagnosis not present

## 2022-11-26 DIAGNOSIS — G7281 Critical illness myopathy: Secondary | ICD-10-CM | POA: Diagnosis not present

## 2022-11-26 DIAGNOSIS — E1122 Type 2 diabetes mellitus with diabetic chronic kidney disease: Secondary | ICD-10-CM | POA: Diagnosis not present

## 2022-11-26 DIAGNOSIS — I129 Hypertensive chronic kidney disease with stage 1 through stage 4 chronic kidney disease, or unspecified chronic kidney disease: Secondary | ICD-10-CM | POA: Diagnosis not present

## 2022-11-26 DIAGNOSIS — E1142 Type 2 diabetes mellitus with diabetic polyneuropathy: Secondary | ICD-10-CM | POA: Diagnosis not present

## 2022-11-26 DIAGNOSIS — N183 Chronic kidney disease, stage 3 unspecified: Secondary | ICD-10-CM | POA: Diagnosis not present

## 2022-11-29 ENCOUNTER — Ambulatory Visit
Admission: EM | Admit: 2022-11-29 | Discharge: 2022-11-29 | Disposition: A | Discharge status: Home / Self Care | Payer: Medicare Other | Attending: Family Medicine | Admitting: Family Medicine

## 2022-11-29 DIAGNOSIS — R300 Dysuria: Secondary | ICD-10-CM

## 2022-11-29 LAB — POCT URINALYSIS DIP (MANUAL ENTRY)
Bilirubin, UA: NEGATIVE
Blood, UA: NEGATIVE
Glucose, UA: NEGATIVE mg/dL
Ketones, POC UA: NEGATIVE mg/dL
Nitrite, UA: NEGATIVE
Spec Grav, UA: 1.02 (ref 1.010–1.025)
Urobilinogen, UA: 0.2 E.U./dL
pH, UA: 6.5 (ref 5.0–8.0)

## 2022-11-29 MED ORDER — SULFAMETHOXAZOLE-TRIMETHOPRIM 800-160 MG PO TABS: 1.0000 | ORAL_TABLET | Freq: Two times a day (BID) | ORAL | 0 refills | Status: AC

## 2022-11-29 NOTE — Discharge Instructions (Addendum)
Instructed patient to take medication as directed with food to completion.  Encouraged patient to increase daily water intake to 64 ounces per day while taking this medication.  Advised we will follow-up with urine culture results once received.  Advised if symptoms worsen and/or unresolved please follow-up with urology, PCP or here for further evaluation.

## 2022-11-29 NOTE — ED Triage Notes (Signed)
Pt c/o dysuria x 2 days. Had not been able to void at all today though. Last UTI was last month.

## 2022-11-29 NOTE — ED Provider Notes (Signed)
[facility]    CSN: [id number] Arrival date & time: 11/29/22  1325      History   Chief Complaint Chief Complaint  Patient presents with   Dysuria    HPI Randall Fisher is a 67 y.o. male.   HPI Very pleasant 67 year old male presents with dysuria for 2 days.  Reports that he has not been able unable to void today.  Reports last UTI was last month.  PMH significant for morbid obesity, prostate cancer, Parkinson's disease and recurrent UTI.  Patient is accompanied by his wife this afternoon.  Past Medical History:  Diagnosis Date   Anxiety October 2020   Back pain    Depression October 2020   Diabetes mellitus without complication ([place])    GERD (gastroesophageal reflux disease) Unknown   Glaucoma January 2021   Hyperlipidemia    Joint pain    Kidney disease    Leg swelling    Lung cancer (HCC)    Memory loss    Neuropathy    Numbness and tingling in both hands    Parkinson's disease 2021   S/P lobectomy of lung 2013   RUL for NSCLC   Sleep apnea Unknown    Patient Active Problem List   Diagnosis Date Noted   Cellulitis of buttock 10/24/2022   Pneumonia 09/29/2022   Dyspnea 07/14/2022   Fatty liver 03/20/2022   Impaired mobility and ADLs 01/09/2022   Vision loss 12/31/2021   Postobstructive pneumonia 07/26/2021   Multiple system atrophy, 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   Parkinsonism 01/02/2021   Mild cognitive disorder 10/16/2020   Cyst of kidney, acquired 10/16/2020   Angina pectoris ([place]) 10/16/2020   Abnormal gait 10/16/2020   Spondylosis without myelopathy or radiculopathy, lumbar region 10/16/2020   Urinary tract infection 09/24/2020   Right carpal tunnel syndrome 08/28/2020   Major depression 04/18/2020   Venous insufficiency 04/18/2020   Obstructive sleep apnea syndrome 02/24/2020   Non-small cell lung cancer ([place]) 12/05/2019   Mild cognitive impairment 12/05/2019   Gastro-esophageal reflux disease without esophagitis 12/01/2019    Hypertension 12/01/2019   Benign prostatic hyperplasia with lower urinary tract symptoms 07/05/2019   Diabetic peripheral neuropathy associated with type 2 diabetes mellitus ([place]) 07/05/2019   Diabetic renal disease ([place]) 07/05/2019   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/05/2019   Stage 3 chronic kidney disease ([place]) 07/05/2019   Mild major depression, single episode ([place]) 07/05/2019   Herpetic gingivostomatitis 07/05/2019   Functional dyspepsia 07/05/2019   DM (diabetes mellitus) ([place]) 02/17/2013   HLD (hyperlipidemia) 02/17/2013    Past Surgical History:  Procedure Laterality Date   CARPAL TUNNEL RELEASE Bilateral    LUNG LOBECTOMY Right 2013   TONSILLECTOMY         Home Medications    Prior to Admission medications   Medication Sig Start Date End Date Taking? Authorizing Provider  sulfamethoxazole-trimethoprim (BACTRIM DS) 800-160 MG tablet Take 1 tablet by mouth 2 (two) times daily for 7 days. 11/29/22 12/06/22 Yes Eliezer Lofts, FNP  albuterol (PROVENTIL) (2.5 MG/3ML) 0.083% nebulizer solution Take 3 mLs (2.5 mg total) by nebulization every 6 (six) hours as needed for wheezing or shortness of breath. 10/01/22   [name], DO  AMBULATORY NON FORMULARY MEDICATION Please provide lightweight transport chair.  Patient unable to propel himself in standard manual wheelchair.  Wife has trouble pushing standard wheelchair and moving in/out vehicle.  Diagnosis: Parkinsonism- G20, Gait Abnormality-R26.9 12/31/21   [name], DO  budesonide (PULMICORT) 0.5 MG/2ML  nebulizer solution Take 0.5 mg by nebulization daily as needed. 08/31/21   Terrilyn Saver, NP  carbidopa-levodopa (SINEMET IR) 25-100 MG tablet Take 2 tablets by mouth 3 (three) times daily. 06/27/21   Marcial Pacas, MD  clotrimazole-betamethasone (LOTRISONE) cream Apply 1 Application topically daily. 04/10/22   [name], DO  donepezil (ARICEPT) 5 MG tablet Take 1 tablet (5 mg total) by mouth at bedtime. 09/25/21    [name], DO  dorzolamide-timolol (COSOPT) 2-0.5 % ophthalmic solution Place 1 drop into the right eye 2 (two) times daily. 06/27/22   [provider]  Dulaglutide (TRULICITY) 1.5 0000000 SOPN INJECT 1.5 MG(0.5ML) UNDER THE SKIN ONCE A WEEK. 11/28/19   [provider]  fenofibrate 160 MG tablet Take 1 tablet (160 mg total) by mouth daily. 06/05/22   [name], DO  FEROSUL 325 (65 Fe) MG tablet Take 1 tablet (325 mg total) by mouth daily. 10/01/22   [name], DO  fexofenadine-pseudoephedrine (ALLEGRA-D) 60-120 MG 12 hr tablet Take 1 tablet by mouth daily as needed.    [provider]  fluticasone (FLONASE) 50 MCG/ACT nasal spray Place 2 sprays into both nostrils daily. 08/31/21 08/31/22  Terrilyn Saver, NP  fluticasone (FLOVENT HFA) 44 MCG/ACT inhaler Inhale 2 puffs into the lungs in the morning and at bedtime. 01/16/21   Terrilyn Saver, NP  furosemide (LASIX) 20 MG tablet TAKE 1 TABLET BY MOUTH DAILY 02/05/22   [name], DO  insulin degludec (TRESIBA FLEXTOUCH) 200 UNIT/ML FlexTouch Pen Inject 120 Units into the skin.  11/28/19   [provider]  Insulin Pen Needle (BD PEN NEEDLE NANO U/F) 32G X 4 MM MISC USE WITH LEVEMIR FLEXTOUCH DAILY 06/05/22   [name], DO  latanoprost (XALATAN) 0.005 % ophthalmic solution Place 1 drop into both eyes at bedtime.    [provider]  midodrine (PROAMATINE) 5 MG tablet Take 0.5 tablets (2.5 mg total) by mouth 3 (three) times daily. Patient taking differently: Take 5 mg by mouth 3 (three) times daily. 10/01/22 12/30/22  [name], DO  Misc. Devices MISC DX: Severe sleep apnea Start bipap 16/12 cm. Water with mask and supplies 03/24/19   [provider]  Multiple Vitamins-Minerals (MULTIVITAMIN ADULT, MINERALS, PO) Take 1 tablet by mouth.    [provider]  NONFORMULARY OR COMPOUNDED ITEM Please provide rolling walker.  Diagnosis: Gait instability 12/01/19   [name], DO   nystatin-triamcinolone [facility] II) cream APPLY TO AFFECTED AREA(S)  TOPICALLY TWICE DAILY 04/01/22   [name], DO  omeprazole (PRILOSEC) 20 MG capsule TAKE 1 CAPSULE BY MOUTH DAILY 02/05/22   [name], DO  pregabalin (LYRICA) 200 MG capsule Take 200 mg by mouth 2 (two) times daily.  01/17/15   [provider]  Probiotic Product (PROBIOTIC PO) Take 1 tablet by mouth daily.    [provider]  rosuvastatin (CRESTOR) 10 MG tablet TAKE 1 TABLET BY MOUTH DAILY 02/12/22   [name], DO  sertraline (ZOLOFT) 50 MG tablet Take 50 mg by mouth daily. 10/17/20   [provider]  STOOL SOFTENER/LAXATIVE 50-8.6 MG tablet Take 2 tablets by mouth daily. 10/01/22   [name], DO  tadalafil (CIALIS) 20 MG tablet Take 0.5-1 tablets (10-20 mg total) by mouth every other day as needed (ED). Replaces sildenafil as tadalafil recently approved. 09/25/21   [name], DO  tamsulosin (FLOMAX) 0.4 MG CAPS capsule TAKE 1 CAPSULE BY MOUTH DAILY 05/31/22   [name], DO  timolol (TIMOPTIC)  0.5 % ophthalmic solution Place 1 drop into the right eye every morning. 04/07/22   [provider]  valACYclovir (VALTREX) 500 MG tablet Take 500 mg by mouth daily. 06/08/21   [provider]  Grant Ruts INHUB 250-50 MCG/ACT AEPB USE 1 INHALATION BY MOUTH IN THE MORNING AND AT BEDTIME 11/27/21   [name], DO    Family History Family History  Problem Relation Age of Onset   Diabetes Mother    Cancer Mother    Kidney failure Father    Kidney disease Father     Social History Social History   Tobacco Use   Smoking status: Former    Packs/day: 1.50    Years: 40.00    Additional pack years: 0.00    Total pack years: 60.00    Types: Cigarettes    Quit date: 11/15/2011    Years since quitting: 11.0   Smokeless tobacco: Never  Vaping Use   Vaping Use: Former  Substance Use Topics   Alcohol use: Not Currently    Comment: ocassionally   Drug use: Never      Allergies   Shellfish allergy and Mushroom extract complex   Review of Systems Review of Systems  Genitourinary:  Positive for dysuria.  All other systems reviewed and are negative.    Physical Exam Triage Vital Signs ED Triage Vitals [11/29/22 1336]  Enc Vitals Group     BP      Pulse      Resp      Temp      Temp src      SpO2      Weight      Height      Head Circumference      Peak Flow      Pain Score 0     Pain Loc      Pain Edu?      Excl. in [place]?    No data found.  Updated Vital Signs BP (!) 142/97 (BP Location: Left Arm)   Pulse 69   Temp 97.7 F (36.5 C) (Oral)   Resp 16   SpO2 98%   Visual Acuity Right Eye Distance:   Left Eye Distance:   Bilateral Distance:    Right Eye Near:   Left Eye Near:    Bilateral Near:     Physical Exam Vitals and nursing note reviewed.  Constitutional:      Appearance: Normal appearance. He is obese.  HENT:     Head: Normocephalic and atraumatic.     Mouth/Throat:     Mouth: Mucous membranes are moist.     Pharynx: Oropharynx is clear.  Eyes:     Extraocular Movements: Extraocular movements intact.     Conjunctiva/sclera: Conjunctivae normal.     Pupils: Pupils are equal, round, and reactive to light.  Cardiovascular:     Rate and Rhythm: Normal rate and regular rhythm.     Pulses: Normal pulses.     Heart sounds: Normal heart sounds.  Pulmonary:     Effort: Pulmonary effort is normal.     Breath sounds: Normal breath sounds. No wheezing, rhonchi or rales.  Abdominal:     Tenderness: There is no right CVA tenderness or left CVA tenderness.  Musculoskeletal:        General: Normal range of motion.     Cervical back: Normal range of motion and neck supple.  Skin:    General: Skin is warm and dry.  Neurological:  General: No focal deficit present.     Mental Status: He is alert and oriented to person, place, and time. Mental status is at baseline.     Comments: Patient seated comfortably in  wheelchair during exam today.      UC Treatments / Results  Labs (all labs ordered are listed, but only abnormal results are displayed) Labs Reviewed  POCT URINALYSIS DIP (MANUAL ENTRY) - Abnormal; Notable for the following components:      Result Value   Clarity, UA cloudy (*)    Protein Ur, POC trace (*)    Leukocytes, UA Small (1+) (*)    All other components within normal limits  URINE CULTURE    EKG   Radiology No results found.  Procedures Procedures (including critical care time)  Medications Ordered in UC Medications - No data to display  Initial Impression / Assessment and Plan / UC Course  I have reviewed the triage vital signs and the nursing notes.  Pertinent labs & imaging results that were available during my care of the patient were reviewed by me and considered in my medical decision making (see chart for details).     MDM: 1. Dysuria-UA revealed above, urine culture ordered, Rx'd Bactrim 800/160 mg twice daily for 7 days. Instructed patient to take medication as directed with food to completion.  Encouraged patient to increase daily water intake to 64 ounces per day while taking this medication.  Advised we will follow-up with urine culture results once received.  Advised if symptoms worsen and/or unresolved please follow-up with urology, PCP or here for further evaluation.  Patient discharged home, hemodynamically stable.  Final Clinical Impressions(s) / UC Diagnoses   Final diagnoses:  Dysuria     Discharge Instructions      Instructed patient to take medication as directed with food to completion.  Encouraged patient to increase daily water intake to 64 ounces per day while taking this medication.  Advised we will follow-up with urine culture results once received.  Advised if symptoms worsen and/or unresolved please follow-up with urology, PCP or here for further evaluation.     ED Prescriptions     Medication Sig Dispense Auth. Provider    sulfamethoxazole-trimethoprim (BACTRIM DS) 800-160 MG tablet Take 1 tablet by mouth 2 (two) times daily for 7 days. 14 tablet Eliezer Lofts, FNP      PDMP not reviewed this encounter.   Eliezer Lofts, [place] 11/29/22 1407

## 2022-11-30 ENCOUNTER — Telehealth: Payer: Self-pay | Admitting: Emergency Medicine

## 2022-11-30 NOTE — Telephone Encounter (Signed)
Voice mail left for any concerns or questions- pt to go to ED if he is unable to void for more than 8 hours.

## 2022-12-03 DIAGNOSIS — E1142 Type 2 diabetes mellitus with diabetic polyneuropathy: Secondary | ICD-10-CM | POA: Diagnosis not present

## 2022-12-03 DIAGNOSIS — N183 Chronic kidney disease, stage 3 unspecified: Secondary | ICD-10-CM | POA: Diagnosis not present

## 2022-12-03 DIAGNOSIS — G20C Parkinsonism, unspecified: Secondary | ICD-10-CM | POA: Diagnosis not present

## 2022-12-03 DIAGNOSIS — I129 Hypertensive chronic kidney disease with stage 1 through stage 4 chronic kidney disease, or unspecified chronic kidney disease: Secondary | ICD-10-CM | POA: Diagnosis not present

## 2022-12-03 DIAGNOSIS — E1122 Type 2 diabetes mellitus with diabetic chronic kidney disease: Secondary | ICD-10-CM | POA: Diagnosis not present

## 2022-12-03 DIAGNOSIS — G7281 Critical illness myopathy: Secondary | ICD-10-CM | POA: Diagnosis not present

## 2022-12-05 ENCOUNTER — Telehealth: Payer: Self-pay

## 2022-12-05 DIAGNOSIS — G7281 Critical illness myopathy: Secondary | ICD-10-CM | POA: Diagnosis not present

## 2022-12-05 DIAGNOSIS — N183 Chronic kidney disease, stage 3 unspecified: Secondary | ICD-10-CM | POA: Diagnosis not present

## 2022-12-05 DIAGNOSIS — G20C Parkinsonism, unspecified: Secondary | ICD-10-CM | POA: Diagnosis not present

## 2022-12-05 DIAGNOSIS — E1122 Type 2 diabetes mellitus with diabetic chronic kidney disease: Secondary | ICD-10-CM | POA: Diagnosis not present

## 2022-12-05 DIAGNOSIS — I129 Hypertensive chronic kidney disease with stage 1 through stage 4 chronic kidney disease, or unspecified chronic kidney disease: Secondary | ICD-10-CM | POA: Diagnosis not present

## 2022-12-05 DIAGNOSIS — E1142 Type 2 diabetes mellitus with diabetic polyneuropathy: Secondary | ICD-10-CM | POA: Diagnosis not present

## 2022-12-05 NOTE — Telephone Encounter (Signed)
[facility] @ [facility] lvm requesting increased frequency to 1 x 4 wks. VO granted per Dr. Zigmund Daniel.

## 2022-12-09 DIAGNOSIS — F419 Anxiety disorder, unspecified: Secondary | ICD-10-CM | POA: Diagnosis not present

## 2022-12-09 DIAGNOSIS — E782 Mixed hyperlipidemia: Secondary | ICD-10-CM | POA: Diagnosis not present

## 2022-12-09 DIAGNOSIS — G4733 Obstructive sleep apnea (adult) (pediatric): Secondary | ICD-10-CM | POA: Diagnosis not present

## 2022-12-09 DIAGNOSIS — Z9181 History of falling: Secondary | ICD-10-CM | POA: Diagnosis not present

## 2022-12-09 DIAGNOSIS — J40 Bronchitis, not specified as acute or chronic: Secondary | ICD-10-CM | POA: Diagnosis not present

## 2022-12-09 DIAGNOSIS — N401 Enlarged prostate with lower urinary tract symptoms: Secondary | ICD-10-CM | POA: Diagnosis not present

## 2022-12-09 DIAGNOSIS — D509 Iron deficiency anemia, unspecified: Secondary | ICD-10-CM | POA: Diagnosis not present

## 2022-12-09 DIAGNOSIS — Z6835 Body mass index (BMI) 35.0-35.9, adult: Secondary | ICD-10-CM | POA: Diagnosis not present

## 2022-12-09 DIAGNOSIS — E1122 Type 2 diabetes mellitus with diabetic chronic kidney disease: Secondary | ICD-10-CM | POA: Diagnosis not present

## 2022-12-09 DIAGNOSIS — E1142 Type 2 diabetes mellitus with diabetic polyneuropathy: Secondary | ICD-10-CM | POA: Diagnosis not present

## 2022-12-09 DIAGNOSIS — Z794 Long term (current) use of insulin: Secondary | ICD-10-CM | POA: Diagnosis not present

## 2022-12-09 DIAGNOSIS — Z466 Encounter for fitting and adjustment of urinary device: Secondary | ICD-10-CM | POA: Diagnosis not present

## 2022-12-09 DIAGNOSIS — K219 Gastro-esophageal reflux disease without esophagitis: Secondary | ICD-10-CM | POA: Diagnosis not present

## 2022-12-09 DIAGNOSIS — H409 Unspecified glaucoma: Secondary | ICD-10-CM | POA: Diagnosis not present

## 2022-12-09 DIAGNOSIS — R338 Other retention of urine: Secondary | ICD-10-CM | POA: Diagnosis not present

## 2022-12-09 DIAGNOSIS — N183 Chronic kidney disease, stage 3 unspecified: Secondary | ICD-10-CM | POA: Diagnosis not present

## 2022-12-09 DIAGNOSIS — M1990 Unspecified osteoarthritis, unspecified site: Secondary | ICD-10-CM | POA: Diagnosis not present

## 2022-12-09 DIAGNOSIS — C61 Malignant neoplasm of prostate: Secondary | ICD-10-CM | POA: Diagnosis not present

## 2022-12-09 DIAGNOSIS — I129 Hypertensive chronic kidney disease with stage 1 through stage 4 chronic kidney disease, or unspecified chronic kidney disease: Secondary | ICD-10-CM | POA: Diagnosis not present

## 2022-12-09 DIAGNOSIS — Z85118 Personal history of other malignant neoplasm of bronchus and lung: Secondary | ICD-10-CM | POA: Diagnosis not present

## 2022-12-09 DIAGNOSIS — G20C Parkinsonism, unspecified: Secondary | ICD-10-CM | POA: Diagnosis not present

## 2022-12-09 DIAGNOSIS — G7281 Critical illness myopathy: Secondary | ICD-10-CM | POA: Diagnosis not present

## 2022-12-10 DIAGNOSIS — N183 Chronic kidney disease, stage 3 unspecified: Secondary | ICD-10-CM | POA: Diagnosis not present

## 2022-12-10 DIAGNOSIS — E1122 Type 2 diabetes mellitus with diabetic chronic kidney disease: Secondary | ICD-10-CM | POA: Diagnosis not present

## 2022-12-10 DIAGNOSIS — I129 Hypertensive chronic kidney disease with stage 1 through stage 4 chronic kidney disease, or unspecified chronic kidney disease: Secondary | ICD-10-CM | POA: Diagnosis not present

## 2022-12-10 DIAGNOSIS — G20C Parkinsonism, unspecified: Secondary | ICD-10-CM | POA: Diagnosis not present

## 2022-12-10 DIAGNOSIS — E1142 Type 2 diabetes mellitus with diabetic polyneuropathy: Secondary | ICD-10-CM | POA: Diagnosis not present

## 2022-12-10 DIAGNOSIS — G7281 Critical illness myopathy: Secondary | ICD-10-CM | POA: Diagnosis not present

## 2022-12-12 ENCOUNTER — Other Ambulatory Visit: Payer: Self-pay | Admitting: Family Medicine

## 2022-12-12 DIAGNOSIS — E1122 Type 2 diabetes mellitus with diabetic chronic kidney disease: Secondary | ICD-10-CM | POA: Diagnosis not present

## 2022-12-12 DIAGNOSIS — G20C Parkinsonism, unspecified: Secondary | ICD-10-CM | POA: Diagnosis not present

## 2022-12-12 DIAGNOSIS — G7281 Critical illness myopathy: Secondary | ICD-10-CM | POA: Diagnosis not present

## 2022-12-12 DIAGNOSIS — E782 Mixed hyperlipidemia: Secondary | ICD-10-CM

## 2022-12-12 DIAGNOSIS — E1142 Type 2 diabetes mellitus with diabetic polyneuropathy: Secondary | ICD-10-CM | POA: Diagnosis not present

## 2022-12-12 DIAGNOSIS — N183 Chronic kidney disease, stage 3 unspecified: Secondary | ICD-10-CM | POA: Diagnosis not present

## 2022-12-12 DIAGNOSIS — I129 Hypertensive chronic kidney disease with stage 1 through stage 4 chronic kidney disease, or unspecified chronic kidney disease: Secondary | ICD-10-CM | POA: Diagnosis not present

## 2022-12-16 DIAGNOSIS — I129 Hypertensive chronic kidney disease with stage 1 through stage 4 chronic kidney disease, or unspecified chronic kidney disease: Secondary | ICD-10-CM | POA: Diagnosis not present

## 2022-12-16 DIAGNOSIS — E1122 Type 2 diabetes mellitus with diabetic chronic kidney disease: Secondary | ICD-10-CM | POA: Diagnosis not present

## 2022-12-16 DIAGNOSIS — E1142 Type 2 diabetes mellitus with diabetic polyneuropathy: Secondary | ICD-10-CM | POA: Diagnosis not present

## 2022-12-16 DIAGNOSIS — N183 Chronic kidney disease, stage 3 unspecified: Secondary | ICD-10-CM | POA: Diagnosis not present

## 2022-12-16 DIAGNOSIS — G7281 Critical illness myopathy: Secondary | ICD-10-CM | POA: Diagnosis not present

## 2022-12-16 DIAGNOSIS — G20C Parkinsonism, unspecified: Secondary | ICD-10-CM | POA: Diagnosis not present

## 2022-12-19 DIAGNOSIS — E1122 Type 2 diabetes mellitus with diabetic chronic kidney disease: Secondary | ICD-10-CM | POA: Diagnosis not present

## 2022-12-19 DIAGNOSIS — I129 Hypertensive chronic kidney disease with stage 1 through stage 4 chronic kidney disease, or unspecified chronic kidney disease: Secondary | ICD-10-CM | POA: Diagnosis not present

## 2022-12-19 DIAGNOSIS — E1142 Type 2 diabetes mellitus with diabetic polyneuropathy: Secondary | ICD-10-CM | POA: Diagnosis not present

## 2022-12-19 DIAGNOSIS — G7281 Critical illness myopathy: Secondary | ICD-10-CM | POA: Diagnosis not present

## 2022-12-19 DIAGNOSIS — N183 Chronic kidney disease, stage 3 unspecified: Secondary | ICD-10-CM | POA: Diagnosis not present

## 2022-12-19 DIAGNOSIS — G20C Parkinsonism, unspecified: Secondary | ICD-10-CM | POA: Diagnosis not present

## 2022-12-25 DIAGNOSIS — N183 Chronic kidney disease, stage 3 unspecified: Secondary | ICD-10-CM | POA: Diagnosis not present

## 2022-12-25 DIAGNOSIS — E1122 Type 2 diabetes mellitus with diabetic chronic kidney disease: Secondary | ICD-10-CM | POA: Diagnosis not present

## 2022-12-25 DIAGNOSIS — I129 Hypertensive chronic kidney disease with stage 1 through stage 4 chronic kidney disease, or unspecified chronic kidney disease: Secondary | ICD-10-CM | POA: Diagnosis not present

## 2022-12-25 DIAGNOSIS — E1142 Type 2 diabetes mellitus with diabetic polyneuropathy: Secondary | ICD-10-CM | POA: Diagnosis not present

## 2022-12-25 DIAGNOSIS — G20C Parkinsonism, unspecified: Secondary | ICD-10-CM | POA: Diagnosis not present

## 2022-12-25 DIAGNOSIS — G7281 Critical illness myopathy: Secondary | ICD-10-CM | POA: Diagnosis not present

## 2022-12-26 DIAGNOSIS — G7281 Critical illness myopathy: Secondary | ICD-10-CM | POA: Diagnosis not present

## 2022-12-26 DIAGNOSIS — E1142 Type 2 diabetes mellitus with diabetic polyneuropathy: Secondary | ICD-10-CM | POA: Diagnosis not present

## 2022-12-26 DIAGNOSIS — N183 Chronic kidney disease, stage 3 unspecified: Secondary | ICD-10-CM | POA: Diagnosis not present

## 2022-12-26 DIAGNOSIS — E1122 Type 2 diabetes mellitus with diabetic chronic kidney disease: Secondary | ICD-10-CM | POA: Diagnosis not present

## 2022-12-26 DIAGNOSIS — G20C Parkinsonism, unspecified: Secondary | ICD-10-CM | POA: Diagnosis not present

## 2022-12-26 DIAGNOSIS — I129 Hypertensive chronic kidney disease with stage 1 through stage 4 chronic kidney disease, or unspecified chronic kidney disease: Secondary | ICD-10-CM | POA: Diagnosis not present

## 2023-01-03 DIAGNOSIS — I129 Hypertensive chronic kidney disease with stage 1 through stage 4 chronic kidney disease, or unspecified chronic kidney disease: Secondary | ICD-10-CM | POA: Diagnosis not present

## 2023-01-03 DIAGNOSIS — G20C Parkinsonism, unspecified: Secondary | ICD-10-CM | POA: Diagnosis not present

## 2023-01-03 DIAGNOSIS — E1122 Type 2 diabetes mellitus with diabetic chronic kidney disease: Secondary | ICD-10-CM | POA: Diagnosis not present

## 2023-01-03 DIAGNOSIS — E1142 Type 2 diabetes mellitus with diabetic polyneuropathy: Secondary | ICD-10-CM | POA: Diagnosis not present

## 2023-01-03 DIAGNOSIS — N183 Chronic kidney disease, stage 3 unspecified: Secondary | ICD-10-CM | POA: Diagnosis not present

## 2023-01-03 DIAGNOSIS — G7281 Critical illness myopathy: Secondary | ICD-10-CM | POA: Diagnosis not present

## 2023-01-07 ENCOUNTER — Other Ambulatory Visit: Payer: Self-pay | Admitting: Family Medicine

## 2023-01-07 DIAGNOSIS — K219 Gastro-esophageal reflux disease without esophagitis: Secondary | ICD-10-CM

## 2023-01-07 DIAGNOSIS — N1831 Chronic kidney disease, stage 3a: Secondary | ICD-10-CM

## 2023-01-08 DIAGNOSIS — F419 Anxiety disorder, unspecified: Secondary | ICD-10-CM | POA: Diagnosis not present

## 2023-01-08 DIAGNOSIS — R338 Other retention of urine: Secondary | ICD-10-CM | POA: Diagnosis not present

## 2023-01-08 DIAGNOSIS — E1142 Type 2 diabetes mellitus with diabetic polyneuropathy: Secondary | ICD-10-CM | POA: Diagnosis not present

## 2023-01-08 DIAGNOSIS — Z9181 History of falling: Secondary | ICD-10-CM | POA: Diagnosis not present

## 2023-01-08 DIAGNOSIS — Z466 Encounter for fitting and adjustment of urinary device: Secondary | ICD-10-CM | POA: Diagnosis not present

## 2023-01-08 DIAGNOSIS — H409 Unspecified glaucoma: Secondary | ICD-10-CM | POA: Diagnosis not present

## 2023-01-08 DIAGNOSIS — G7281 Critical illness myopathy: Secondary | ICD-10-CM | POA: Diagnosis not present

## 2023-01-08 DIAGNOSIS — G20C Parkinsonism, unspecified: Secondary | ICD-10-CM | POA: Diagnosis not present

## 2023-01-08 DIAGNOSIS — Z6835 Body mass index (BMI) 35.0-35.9, adult: Secondary | ICD-10-CM | POA: Diagnosis not present

## 2023-01-08 DIAGNOSIS — G4733 Obstructive sleep apnea (adult) (pediatric): Secondary | ICD-10-CM | POA: Diagnosis not present

## 2023-01-08 DIAGNOSIS — C61 Malignant neoplasm of prostate: Secondary | ICD-10-CM | POA: Diagnosis not present

## 2023-01-08 DIAGNOSIS — E782 Mixed hyperlipidemia: Secondary | ICD-10-CM | POA: Diagnosis not present

## 2023-01-08 DIAGNOSIS — Z85118 Personal history of other malignant neoplasm of bronchus and lung: Secondary | ICD-10-CM | POA: Diagnosis not present

## 2023-01-08 DIAGNOSIS — N401 Enlarged prostate with lower urinary tract symptoms: Secondary | ICD-10-CM | POA: Diagnosis not present

## 2023-01-08 DIAGNOSIS — K219 Gastro-esophageal reflux disease without esophagitis: Secondary | ICD-10-CM | POA: Diagnosis not present

## 2023-01-08 DIAGNOSIS — Z794 Long term (current) use of insulin: Secondary | ICD-10-CM | POA: Diagnosis not present

## 2023-01-08 DIAGNOSIS — D509 Iron deficiency anemia, unspecified: Secondary | ICD-10-CM | POA: Diagnosis not present

## 2023-01-08 DIAGNOSIS — J40 Bronchitis, not specified as acute or chronic: Secondary | ICD-10-CM | POA: Diagnosis not present

## 2023-01-08 DIAGNOSIS — E1122 Type 2 diabetes mellitus with diabetic chronic kidney disease: Secondary | ICD-10-CM | POA: Diagnosis not present

## 2023-01-08 DIAGNOSIS — N183 Chronic kidney disease, stage 3 unspecified: Secondary | ICD-10-CM | POA: Diagnosis not present

## 2023-01-08 DIAGNOSIS — I129 Hypertensive chronic kidney disease with stage 1 through stage 4 chronic kidney disease, or unspecified chronic kidney disease: Secondary | ICD-10-CM | POA: Diagnosis not present

## 2023-01-08 DIAGNOSIS — M1990 Unspecified osteoarthritis, unspecified site: Secondary | ICD-10-CM | POA: Diagnosis not present

## 2023-01-09 DIAGNOSIS — E1122 Type 2 diabetes mellitus with diabetic chronic kidney disease: Secondary | ICD-10-CM | POA: Diagnosis not present

## 2023-01-09 DIAGNOSIS — G20C Parkinsonism, unspecified: Secondary | ICD-10-CM | POA: Diagnosis not present

## 2023-01-09 DIAGNOSIS — I129 Hypertensive chronic kidney disease with stage 1 through stage 4 chronic kidney disease, or unspecified chronic kidney disease: Secondary | ICD-10-CM | POA: Diagnosis not present

## 2023-01-09 DIAGNOSIS — G7281 Critical illness myopathy: Secondary | ICD-10-CM | POA: Diagnosis not present

## 2023-01-09 DIAGNOSIS — N183 Chronic kidney disease, stage 3 unspecified: Secondary | ICD-10-CM | POA: Diagnosis not present

## 2023-01-09 DIAGNOSIS — E1142 Type 2 diabetes mellitus with diabetic polyneuropathy: Secondary | ICD-10-CM | POA: Diagnosis not present

## 2023-01-28 DIAGNOSIS — R972 Elevated prostate specific antigen [PSA]: Secondary | ICD-10-CM | POA: Diagnosis not present

## 2023-01-28 DIAGNOSIS — C61 Malignant neoplasm of prostate: Secondary | ICD-10-CM | POA: Diagnosis not present

## 2023-01-28 DIAGNOSIS — N410 Acute prostatitis: Secondary | ICD-10-CM | POA: Diagnosis not present

## 2023-01-28 DIAGNOSIS — Z8546 Personal history of malignant neoplasm of prostate: Secondary | ICD-10-CM | POA: Diagnosis not present

## 2023-01-28 DIAGNOSIS — N411 Chronic prostatitis: Secondary | ICD-10-CM | POA: Diagnosis not present

## 2023-01-29 DIAGNOSIS — C61 Malignant neoplasm of prostate: Secondary | ICD-10-CM | POA: Diagnosis not present

## 2023-01-29 DIAGNOSIS — N400 Enlarged prostate without lower urinary tract symptoms: Secondary | ICD-10-CM | POA: Diagnosis not present

## 2023-01-29 DIAGNOSIS — N411 Chronic prostatitis: Secondary | ICD-10-CM | POA: Diagnosis not present

## 2023-01-30 DIAGNOSIS — E1142 Type 2 diabetes mellitus with diabetic polyneuropathy: Secondary | ICD-10-CM | POA: Diagnosis not present

## 2023-01-30 DIAGNOSIS — I129 Hypertensive chronic kidney disease with stage 1 through stage 4 chronic kidney disease, or unspecified chronic kidney disease: Secondary | ICD-10-CM | POA: Diagnosis not present

## 2023-01-30 DIAGNOSIS — N183 Chronic kidney disease, stage 3 unspecified: Secondary | ICD-10-CM | POA: Diagnosis not present

## 2023-01-30 DIAGNOSIS — G7281 Critical illness myopathy: Secondary | ICD-10-CM | POA: Diagnosis not present

## 2023-01-30 DIAGNOSIS — G20C Parkinsonism, unspecified: Secondary | ICD-10-CM | POA: Diagnosis not present

## 2023-01-30 DIAGNOSIS — E1122 Type 2 diabetes mellitus with diabetic chronic kidney disease: Secondary | ICD-10-CM | POA: Diagnosis not present

## 2023-02-04 ENCOUNTER — Telehealth: Payer: Self-pay | Admitting: Family Medicine

## 2023-02-04 DIAGNOSIS — E1122 Type 2 diabetes mellitus with diabetic chronic kidney disease: Secondary | ICD-10-CM | POA: Diagnosis not present

## 2023-02-04 DIAGNOSIS — N183 Chronic kidney disease, stage 3 unspecified: Secondary | ICD-10-CM | POA: Diagnosis not present

## 2023-02-04 DIAGNOSIS — G20C Parkinsonism, unspecified: Secondary | ICD-10-CM | POA: Diagnosis not present

## 2023-02-04 DIAGNOSIS — I129 Hypertensive chronic kidney disease with stage 1 through stage 4 chronic kidney disease, or unspecified chronic kidney disease: Secondary | ICD-10-CM | POA: Diagnosis not present

## 2023-02-04 DIAGNOSIS — E1142 Type 2 diabetes mellitus with diabetic polyneuropathy: Secondary | ICD-10-CM | POA: Diagnosis not present

## 2023-02-04 DIAGNOSIS — G7281 Critical illness myopathy: Secondary | ICD-10-CM | POA: Diagnosis not present

## 2023-02-04 NOTE — Telephone Encounter (Signed)
Feliz Beam, PT with [facility] called. He did a home health discharge visit-max potential.

## 2023-02-14 DIAGNOSIS — H401113 Primary open-angle glaucoma, right eye, severe stage: Secondary | ICD-10-CM | POA: Diagnosis not present

## 2023-02-14 DIAGNOSIS — H40012 Open angle with borderline findings, low risk, left eye: Secondary | ICD-10-CM | POA: Diagnosis not present

## 2023-02-14 DIAGNOSIS — E119 Type 2 diabetes mellitus without complications: Secondary | ICD-10-CM | POA: Diagnosis not present

## 2023-02-19 ENCOUNTER — Other Ambulatory Visit: Payer: Self-pay | Admitting: Family Medicine

## 2023-02-19 DIAGNOSIS — J329 Chronic sinusitis, unspecified: Secondary | ICD-10-CM

## 2023-02-19 DIAGNOSIS — J069 Acute upper respiratory infection, unspecified: Secondary | ICD-10-CM

## 2023-02-20 ENCOUNTER — Ambulatory Visit
Admission: EM | Admit: 2023-02-20 | Discharge: 2023-02-20 | Disposition: A | Discharge status: Home / Self Care | Payer: Medicare Other | Attending: Family Medicine | Admitting: Family Medicine

## 2023-02-20 ENCOUNTER — Encounter: Payer: Self-pay | Admitting: Emergency Medicine

## 2023-02-20 DIAGNOSIS — R300 Dysuria: Secondary | ICD-10-CM | POA: Diagnosis not present

## 2023-02-20 DIAGNOSIS — N3000 Acute cystitis without hematuria: Secondary | ICD-10-CM | POA: Insufficient documentation

## 2023-02-20 LAB — POCT URINALYSIS DIP (MANUAL ENTRY)
Bilirubin, UA: NEGATIVE
Blood, UA: NEGATIVE
Glucose, UA: NEGATIVE mg/dL
Ketones, POC UA: NEGATIVE mg/dL
Nitrite, UA: NEGATIVE
Protein Ur, POC: NEGATIVE mg/dL
Spec Grav, UA: 1.02 (ref 1.010–1.025)
Urobilinogen, UA: 0.2 E.U./dL
pH, UA: 6.5 (ref 5.0–8.0)

## 2023-02-20 MED ORDER — SULFAMETHOXAZOLE-TRIMETHOPRIM 800-160 MG PO TABS: 1.0000 | ORAL_TABLET | Freq: Two times a day (BID) | ORAL | 0 refills | Status: AC

## 2023-02-20 NOTE — Discharge Instructions (Addendum)
Instructed patient to take medication as directed with food to completion.  Encouraged increase daily water intake to 64 ounces per day while taking this medication.  Advised we will follow-up with urine culture results once received.  Advised patient if symptoms worsen and/or unresolved please follow-up with PCP urology or here for further evaluation.

## 2023-02-20 NOTE — ED Provider Notes (Signed)
[facility]    CSN: [id number] Arrival date & time: 02/20/23  1523      History   Chief Complaint Chief Complaint  Patient presents with   Dysuria    HPI LIPA GLEW is a 67 y.o. male.   HPI Very pleasant 67 year old male presents with dysuria and dark urine for 3 days.  Patient typically self caths and will try to give a urine sample now why here in clinic.  PMH significant for morbid obesity, Parkinson's disease, and T2DM without complication.  Patient unable to void for UA after drinking 2 cups of water.  Advised wife to return home for self cath supplies and return to clinic so that we may obtain UA.  Past Medical History:  Diagnosis Date   Anxiety October 2020   Back pain    Depression October 2020   Diabetes mellitus without complication (HCC)    GERD (gastroesophageal reflux disease) Unknown   Glaucoma January 2021   Hyperlipidemia    Joint pain    Kidney disease    Leg swelling    Lung cancer (HCC)    Memory loss    Neuropathy    Numbness and tingling in both hands    Parkinson's disease 2021   S/P lobectomy of lung 2013   RUL for NSCLC   Sleep apnea Unknown    Patient Active Problem List   Diagnosis Date Noted   Cellulitis of buttock 10/24/2022   Pneumonia 09/29/2022   Dyspnea 07/14/2022   Fatty liver 03/20/2022   Impaired mobility and ADLs 01/09/2022   Vision loss 12/31/2021   Postobstructive pneumonia 07/26/2021   Multiple system atrophy, 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   Parkinsonism 01/02/2021   Mild cognitive disorder 10/16/2020   Cyst of kidney, acquired 10/16/2020   Angina pectoris (HCC) 10/16/2020   Abnormal gait 10/16/2020   Spondylosis without myelopathy or radiculopathy, lumbar region 10/16/2020   Urinary tract infection 09/24/2020   Right carpal tunnel syndrome 08/28/2020   Major depression 04/18/2020   Venous insufficiency 04/18/2020   Obstructive sleep apnea syndrome 02/24/2020   Non-small cell lung cancer (HCC)  12/05/2019   Mild cognitive impairment 12/05/2019   Gastro-esophageal reflux disease without esophagitis 12/01/2019   Hypertension 12/01/2019   Benign prostatic hyperplasia with lower urinary tract symptoms 07/05/2019   Diabetic peripheral neuropathy associated with type 2 diabetes mellitus (HCC) 07/05/2019   Diabetic renal disease (HCC) 07/05/2019   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/05/2019   Stage 3 chronic kidney disease (HCC) 07/05/2019   Mild major depression, single episode (HCC) 07/05/2019   Herpetic gingivostomatitis 07/05/2019   Functional dyspepsia 07/05/2019   DM (diabetes mellitus) (HCC) 02/17/2013   HLD (hyperlipidemia) 02/17/2013    Past Surgical History:  Procedure Laterality Date   CARPAL TUNNEL RELEASE Bilateral    LUNG LOBECTOMY Right 2013   TONSILLECTOMY         Home Medications    Prior to Admission medications   Medication Sig Start Date End Date Taking? Authorizing Provider  albuterol (PROVENTIL) (2.5 MG/3ML) 0.083% nebulizer solution USE 1 VIAL VIA NEBULIZER EVERY 6 HOURS AS NEEDED FOR WHEEZING OR  SHORTNESS OF BREATH 02/19/23  Yes Everrett Coombe, DO  AMBULATORY NON FORMULARY MEDICATION Please provide lightweight transport chair.  Patient unable to propel himself in standard manual wheelchair.  Wife has trouble pushing standard wheelchair and moving in/out vehicle.  Diagnosis: Parkinsonism- G20, Gait Abnormality-R26.9 12/31/21  Yes Ashley Royalty, Cody, DO  budesonide (PULMICORT) 0.5 MG/2ML nebulizer solution Take  0.5 mg by nebulization daily as needed. 08/31/21  Yes Clayborne Dana, NP  carbidopa-levodopa (SINEMET IR) 25-100 MG tablet Take 2 tablets by mouth 3 (three) times daily. 06/27/21  Yes Levert Feinstein, MD  clotrimazole-betamethasone (LOTRISONE) cream Apply 1 Application topically daily. 04/10/22  Yes Everrett Coombe, DO  dorzolamide-timolol (COSOPT) 2-0.5 % ophthalmic solution Place 1 drop into the right eye 2 (two) times daily. 06/27/22  Yes [provider]  Dulaglutide (TRULICITY) 1.5 MG/0.5ML SOPN INJECT 1.5 MG(0.5ML) UNDER THE SKIN ONCE A WEEK. 11/28/19  Yes [provider]  fenofibrate 160 MG tablet Take 1 tablet (160 mg total) by mouth daily. 06/05/22  Yes Matthews, Cody, DO  FEROSUL 325 (65 Fe) MG tablet Take 1 tablet (325 mg total) by mouth daily. 10/01/22  Yes Everrett Coombe, DO  fexofenadine-pseudoephedrine (ALLEGRA-D) 60-120 MG 12 hr tablet Take 1 tablet by mouth daily as needed.   Yes [provider]  fluticasone (FLOVENT HFA) 44 MCG/ACT inhaler Inhale 2 puffs into the lungs in the morning and at bedtime. 01/16/21  Yes Hyman Hopes B, NP  furosemide (LASIX) 20 MG tablet TAKE 1 TABLET BY MOUTH DAILY 01/07/23  Yes Everrett Coombe, DO  insulin degludec (TRESIBA FLEXTOUCH) 200 UNIT/ML FlexTouch Pen Inject 120 Units into the skin.  11/28/19  Yes [provider]  Insulin Pen Needle (BD PEN NEEDLE NANO U/F) 32G X 4 MM MISC USE WITH LEVEMIR FLEXTOUCH DAILY 06/05/22  Yes Ashley Royalty, Cody, DO  latanoprost (XALATAN) 0.005 % ophthalmic solution Place 1 drop into both eyes at bedtime.   Yes [provider]  Misc. Devices MISC DX: Severe sleep apnea Start bipap 16/12 cm. Water with mask and supplies 03/24/19  Yes [provider]  Multiple Vitamins-Minerals (MULTIVITAMIN ADULT, MINERALS, PO) Take 1 tablet by mouth.   Yes [provider]  NONFORMULARY OR COMPOUNDED ITEM Please provide rolling walker.  Diagnosis: Gait instability 12/01/19  Yes Everrett Coombe, DO  nystatin-triamcinolone [facility] II) cream APPLY TO AFFECTED AREA(S)  TOPICALLY TWICE DAILY 04/01/22  Yes Everrett Coombe, DO  omeprazole (PRILOSEC) 20 MG capsule TAKE 1 CAPSULE BY MOUTH DAILY 01/07/23  Yes Everrett Coombe, DO  pregabalin (LYRICA) 200 MG capsule Take 200 mg by mouth 2 (two) times daily.  01/17/15  Yes [provider]  Probiotic Product (PROBIOTIC PO) Take 1 tablet by mouth daily.   Yes [provider]  rosuvastatin  (CRESTOR) 10 MG tablet Take 1 tablet (10 mg total) by mouth daily. Needs labs. 12/12/22  Yes Everrett Coombe, DO  sertraline (ZOLOFT) 50 MG tablet Take 50 mg by mouth daily. 10/17/20  Yes [provider]  STOOL SOFTENER/LAXATIVE 50-8.6 MG tablet Take 2 tablets by mouth daily. 10/01/22  Yes Everrett Coombe, DO  sulfamethoxazole-trimethoprim (BACTRIM DS) 800-160 MG tablet Take 1 tablet by mouth 2 (two) times daily for 7 days. 02/20/23 02/27/23 Yes Trevor Iha, FNP  tadalafil (CIALIS) 20 MG tablet Take 0.5-1 tablets (10-20 mg total) by mouth every other day as needed (ED). Replaces sildenafil as tadalafil recently approved. 09/25/21  Yes Everrett Coombe, DO  tamsulosin (FLOMAX) 0.4 MG CAPS capsule TAKE 1 CAPSULE BY MOUTH DAILY 05/31/22  Yes Everrett Coombe, DO  valACYclovir (VALTREX) 500 MG tablet Take 500 mg by mouth daily. 06/08/21  Yes [provider]  Monte Fantasia INHUB 250-50 MCG/ACT AEPB USE 1 INHALATION BY MOUTH IN THE MORNING AND AT BEDTIME 11/27/21  Yes Everrett Coombe, DO  donepezil (ARICEPT) 5 MG tablet Take 1 tablet (5 mg total) by mouth  at bedtime. 09/25/21   Everrett Coombe, DO  fluticasone (FLONASE) 50 MCG/ACT nasal spray Place 2 sprays into both nostrils daily. 08/31/21 08/31/22  Clayborne Dana, NP  timolol (TIMOPTIC) 0.5 % ophthalmic solution Place 1 drop into the right eye every morning. 04/07/22   [provider]    Family History Family History  Problem Relation Age of Onset   Diabetes Mother    Cancer Mother    Kidney failure Father    Kidney disease Father     Social History Social History   Tobacco Use   Smoking status: Former    Packs/day: 1.50    Years: 40.00    Additional pack years: 0.00    Total pack years: 60.00    Types: Cigarettes    Quit date: 11/15/2011    Years since quitting: 11.2   Smokeless tobacco: Never  Vaping Use   Vaping Use: Former  Substance Use Topics   Alcohol use: Not Currently    Comment: ocassionally   Drug use: Never      Allergies   Shellfish allergy and Mushroom extract complex   Review of Systems Review of Systems  Genitourinary:  Positive for dysuria.  All other systems reviewed and are negative.    Physical Exam Triage Vital Signs ED Triage Vitals  Enc Vitals Group     BP 02/20/23 1635 112/70     Pulse Rate 02/20/23 1635 71     Resp 02/20/23 1635 18     Temp 02/20/23 1635 98.2 F (36.8 C)     Temp Source 02/20/23 1635 Oral     SpO2 02/20/23 1635 99 %     Weight --      Height --      Head Circumference --      Peak Flow --      Pain Score 02/20/23 1636 0     Pain Loc --      Pain Edu? --      Excl. in GC? --    No data found.  Updated Vital Signs BP 112/70 (BP Location: Right Arm)   Pulse 71   Temp 98.2 F (36.8 C) (Oral)   Resp 18   SpO2 99%    Physical Exam Vitals and nursing note reviewed.  Constitutional:      Appearance: Normal appearance. He is obese. He is ill-appearing.  HENT:     Head: Normocephalic and atraumatic.     Mouth/Throat:     Mouth: Mucous membranes are moist.     Pharynx: Oropharynx is clear.  Eyes:     Extraocular Movements: Extraocular movements intact.     Conjunctiva/sclera: Conjunctivae normal.     Pupils: Pupils are equal, round, and reactive to light.  Cardiovascular:     Rate and Rhythm: Normal rate and regular rhythm.     Pulses: Normal pulses.     Heart sounds: Normal heart sounds.  Pulmonary:     Effort: Pulmonary effort is normal.     Breath sounds: Normal breath sounds. No wheezing, rhonchi or rales.  Abdominal:     Tenderness: There is no right CVA tenderness or left CVA tenderness.  Musculoskeletal:        General: Normal range of motion.     Cervical back: Normal range of motion and neck supple.  Skin:    General: Skin is warm and dry.  Neurological:     General: No focal deficit present.     Mental Status: He is alert  and oriented to person, place, and time. Mental status is at baseline.     Comments: Patient  seated comfortably in wheelchair this evening during my exam      UC Treatments / Results  Labs (all labs ordered are listed, but only abnormal results are displayed) Labs Reviewed  POCT URINALYSIS DIP (MANUAL ENTRY) - Abnormal; Notable for the following components:      Result Value   Color, UA light yellow (*)    Clarity, UA cloudy (*)    Leukocytes, UA Moderate (2+) (*)    All other components within normal limits  URINE CULTURE    EKG   Radiology No results found.  Procedures Procedures (including critical care time)  Medications Ordered in UC Medications - No data to display  Initial Impression / Assessment and Plan / UC Course  I have reviewed the triage vital signs and the nursing notes.  Pertinent labs & imaging results that were available during my care of the patient were reviewed by me and considered in my medical decision making (see chart for details).     MDM: 1.  Acute cystitis without hematuria-UA revealed above, urine culture ordered, Rx'd Bactrim DS 800/160 mg tablet twice daily x 7 days.  Patient's wife returned home for self cath supplies and patient self cath here in exam room this evening. Instructed patient to take medication as directed with food to completion.  Encouraged increase daily water intake to 64 ounces per day while taking this medication.  Advised we will follow-up with urine culture results once received.  Advised patient if symptoms worsen and/or unresolved please follow-up with PCP urology or here for further evaluation. Final Clinical Impressions(s) / UC Diagnoses   Final diagnoses:  Dysuria  Acute cystitis without hematuria     Discharge Instructions      Instructed patient to take medication as directed with food to completion.  Encouraged increase daily water intake to 64 ounces per day while taking this medication.  Advised we will follow-up with urine culture results once received.  Advised patient if symptoms worsen and/or  unresolved please follow-up with PCP urology or here for further evaluation.     ED Prescriptions     Medication Sig Dispense Auth. Provider   sulfamethoxazole-trimethoprim (BACTRIM DS) 800-160 MG tablet Take 1 tablet by mouth 2 (two) times daily for 7 days. 14 tablet Trevor Iha, FNP      PDMP not reviewed this encounter.   Trevor Iha, FNP 02/20/23 1903

## 2023-02-20 NOTE — ED Triage Notes (Signed)
Patient c/o dysuria and dark urine for a couple of days.  Patient does self cath but would like to try and urinate on his own.

## 2023-02-23 LAB — URINE CULTURE: Culture: >=100000 — AB

## 2023-03-01 DIAGNOSIS — Z794 Long term (current) use of insulin: Secondary | ICD-10-CM | POA: Diagnosis not present

## 2023-03-01 DIAGNOSIS — Z7951 Long term (current) use of inhaled steroids: Secondary | ICD-10-CM | POA: Diagnosis not present

## 2023-03-01 DIAGNOSIS — R339 Retention of urine, unspecified: Secondary | ICD-10-CM | POA: Diagnosis not present

## 2023-03-01 DIAGNOSIS — Z91013 Allergy to seafood: Secondary | ICD-10-CM | POA: Diagnosis not present

## 2023-03-01 DIAGNOSIS — M1990 Unspecified osteoarthritis, unspecified site: Secondary | ICD-10-CM | POA: Diagnosis not present

## 2023-03-01 DIAGNOSIS — Z87891 Personal history of nicotine dependence: Secondary | ICD-10-CM | POA: Diagnosis not present

## 2023-03-01 DIAGNOSIS — Z466 Encounter for fitting and adjustment of urinary device: Secondary | ICD-10-CM | POA: Diagnosis not present

## 2023-03-01 DIAGNOSIS — F419 Anxiety disorder, unspecified: Secondary | ICD-10-CM | POA: Diagnosis not present

## 2023-03-01 DIAGNOSIS — I129 Hypertensive chronic kidney disease with stage 1 through stage 4 chronic kidney disease, or unspecified chronic kidney disease: Secondary | ICD-10-CM | POA: Diagnosis not present

## 2023-03-01 DIAGNOSIS — Z85118 Personal history of other malignant neoplasm of bronchus and lung: Secondary | ICD-10-CM | POA: Diagnosis not present

## 2023-03-01 DIAGNOSIS — E1122 Type 2 diabetes mellitus with diabetic chronic kidney disease: Secondary | ICD-10-CM | POA: Diagnosis not present

## 2023-03-01 DIAGNOSIS — G4730 Sleep apnea, unspecified: Secondary | ICD-10-CM | POA: Diagnosis not present

## 2023-03-01 DIAGNOSIS — K219 Gastro-esophageal reflux disease without esophagitis: Secondary | ICD-10-CM | POA: Diagnosis not present

## 2023-03-01 DIAGNOSIS — Z7985 Long-term (current) use of injectable non-insulin antidiabetic drugs: Secondary | ICD-10-CM | POA: Diagnosis not present

## 2023-03-01 DIAGNOSIS — Z91018 Allergy to other foods: Secondary | ICD-10-CM | POA: Diagnosis not present

## 2023-03-01 DIAGNOSIS — E785 Hyperlipidemia, unspecified: Secondary | ICD-10-CM | POA: Diagnosis not present

## 2023-03-01 DIAGNOSIS — N183 Chronic kidney disease, stage 3 unspecified: Secondary | ICD-10-CM | POA: Diagnosis not present

## 2023-03-01 DIAGNOSIS — F0390 Unspecified dementia without behavioral disturbance: Secondary | ICD-10-CM | POA: Diagnosis not present

## 2023-03-01 DIAGNOSIS — Z79899 Other long term (current) drug therapy: Secondary | ICD-10-CM | POA: Diagnosis not present

## 2023-03-04 ENCOUNTER — Telehealth: Payer: Self-pay | Admitting: Family Medicine

## 2023-03-04 DIAGNOSIS — G4752 REM sleep behavior disorder: Secondary | ICD-10-CM | POA: Diagnosis not present

## 2023-03-04 DIAGNOSIS — I951 Orthostatic hypotension: Secondary | ICD-10-CM | POA: Diagnosis not present

## 2023-03-04 DIAGNOSIS — R1310 Dysphagia, unspecified: Secondary | ICD-10-CM | POA: Diagnosis not present

## 2023-03-04 DIAGNOSIS — G232 Striatonigral degeneration: Secondary | ICD-10-CM | POA: Diagnosis not present

## 2023-03-04 DIAGNOSIS — G8929 Other chronic pain: Secondary | ICD-10-CM | POA: Diagnosis not present

## 2023-03-04 DIAGNOSIS — M5441 Lumbago with sciatica, right side: Secondary | ICD-10-CM | POA: Diagnosis not present

## 2023-03-04 NOTE — Telephone Encounter (Signed)
I called pt as told,  to see if they needed a visit with their pcp after being seen in the ER and left a pt a voicemail

## 2023-03-05 ENCOUNTER — Encounter: Payer: Self-pay | Admitting: Family Medicine

## 2023-03-05 ENCOUNTER — Telehealth (INDEPENDENT_AMBULATORY_CARE_PROVIDER_SITE_OTHER): Payer: Medicare Other | Admitting: Family Medicine

## 2023-03-05 VITALS — Ht 69.0 in | Wt 235.2 lb

## 2023-03-05 DIAGNOSIS — F339 Major depressive disorder, recurrent, unspecified: Secondary | ICD-10-CM | POA: Diagnosis not present

## 2023-03-05 DIAGNOSIS — F330 Major depressive disorder, recurrent, mild: Secondary | ICD-10-CM

## 2023-03-05 MED ORDER — SERTRALINE HCL 50 MG PO TABS: 50.0000 mg | ORAL_TABLET | Freq: Every day | ORAL | 3 refills | Status: DC

## 2023-03-05 NOTE — Assessment & Plan Note (Signed)
He is doing well with sertraline at current strength.  Will plan to continue, refill sent in.

## 2023-03-05 NOTE — Progress Notes (Signed)
Randall Fisher - 67 y.o. male MRN [id number]  Date of birth: [birth year]   This visit type was conducted due to national recommendations for restrictions regarding the COVID-19 Pandemic (e.g. social distancing).  This format is felt to be most appropriate for this patient at this time.  All issues noted in this document were discussed and addressed.  No physical exam was performed (except for noted visual exam findings with Video Visits).  I discussed the limitations of evaluation and management by telemedicine and the availability of in person appointments. The patient expressed understanding and agreed to proceed.  I connected withNAME@ on 03/05/23 at  1:10 PM EDT by a video enabled telemedicine application and verified that I am speaking with the correct person using two identifiers.  Present at visit: Randall Coombe, DO Randall [facility]   Patient Location: Home   [address] [place] [address]   Provider location:   [facility]  Chief Complaint  Patient presents with   Medication Refill    HPI  Randall Fisher is a 67 y.o. male who presents via audio/video conferencing for a telehealth visit today. He is following up on depression and anxiety.  Remains on sertraline which is working well for him.  He just needs a refill of this.  No negative side effects.    ROS:  A comprehensive ROS was completed and negative except as noted per HPI  Past Medical History:  Diagnosis Date   Anxiety October 2020   Back pain    Depression October 2020   Diabetes mellitus without complication (HCC)    GERD (gastroesophageal reflux disease) Unknown   Glaucoma January 2021   Hyperlipidemia    Joint pain    Kidney disease    Leg swelling    Lung cancer (HCC)    Memory loss    Neuropathy    Numbness and tingling in both hands    Parkinson's disease 2021   S/P lobectomy of lung 2013   RUL for NSCLC   Sleep apnea Unknown    Past Surgical History:  Procedure Laterality Date   CARPAL  TUNNEL RELEASE Bilateral    LUNG LOBECTOMY Right 2013   TONSILLECTOMY      Family History  Problem Relation Age of Onset   Diabetes Mother    Cancer Mother    Kidney failure Father    Kidney disease Father     Social History   Socioeconomic History   Marital status: Married    Spouse name: Randall Fisher   Number of children: 2   Years of education: 14   Highest education level: Some college, no degree  Occupational History   Occupation: unemployed - laid off   Occupation: seeking disability   Occupation: disabled  Tobacco Use   Smoking status: Former    Packs/day: 1.50    Years: 40.00    Additional pack years: 0.00    Total pack years: 60.00    Types: Cigarettes    Quit date: 11/15/2011    Years since quitting: 11.3   Smokeless tobacco: Never  Vaping Use   Vaping Use: Former  Substance and Sexual Activity   Alcohol use: Not Currently    Comment: ocassionally   Drug use: Never   Sexual activity: Not Currently    Partners: Female    Birth control/protection: Condom  Other Topics Concern   Not on file  Social History Narrative   Lives with his wife. Spending time with grandkids.  Social Determinants of Health   Financial Resource Strain: Low Risk  (07/29/2022)   Overall Financial Resource Strain (CARDIA)    Difficulty of Paying Living Expenses: Not hard at all  Food Insecurity: No Food Insecurity (07/29/2022)   Hunger Vital Sign    Worried About Running Out of Food in the Last Year: Never true    Ran Out of Food in the Last Year: Never true  Transportation Needs: No Transportation Needs (11/15/2022)   PRAPARE - Administrator, Civil Service (Medical): No    Lack of Transportation (Non-Medical): No  Physical Activity: Insufficiently Active (07/29/2022)   Exercise Vital Sign    Days of Exercise per Week: 2 days    Minutes of Exercise per Session: 50 min  Stress: No Stress Concern Present (07/29/2022)   [employer] of Occupational Health -  Occupational Stress Questionnaire    Feeling of Stress : Not at all  Social Connections: Moderately Isolated (07/29/2022)   Social Connection and Isolation Panel [NHANES]    Frequency of Communication with Friends and Family: More than three times a week    Frequency of Social Gatherings with Friends and Family: Once a week    Attends Religious Services: Never    Database administrator or Organizations: No    Attends Banker Meetings: Never    Marital Status: Married  Catering manager Violence: Not At Risk (07/29/2022)   Humiliation, Afraid, Rape, and Kick questionnaire    Fear of Current or Ex-Partner: No    Emotionally Abused: No    Physically Abused: No    Sexually Abused: No     Current Outpatient Medications:    albuterol (PROVENTIL) (2.5 MG/3ML) 0.083% nebulizer solution, USE 1 VIAL VIA NEBULIZER EVERY 6 HOURS AS NEEDED FOR WHEEZING OR  SHORTNESS OF BREATH, Disp: 300 mL, Rfl: 0   AMBULATORY NON FORMULARY MEDICATION, Please provide lightweight transport chair.  Patient unable to propel himself in standard manual wheelchair.  Wife has trouble pushing standard wheelchair and moving in/out vehicle.  Diagnosis: Parkinsonism- G20, Gait Abnormality-R26.9, Disp: 1 Device, Rfl: 0   budesonide (PULMICORT) 0.5 MG/2ML nebulizer solution, Take 0.5 mg by nebulization daily as needed., Disp: 100 mL, Rfl: 10   carbidopa-levodopa (SINEMET IR) 25-100 MG tablet, Take 2 tablets by mouth 3 (three) times daily., Disp: 540 tablet, Rfl: 3   clotrimazole-betamethasone (LOTRISONE) cream, Apply 1 Application topically daily., Disp: 45 g, Rfl: 3   dorzolamide-timolol (COSOPT) 2-0.5 % ophthalmic solution, Place 1 drop into the right eye 2 (two) times daily., Disp: , Rfl:    Dulaglutide (TRULICITY) 1.5 MG/0.5ML SOPN, INJECT 1.5 MG(0.5ML) UNDER THE SKIN ONCE A WEEK., Disp: , Rfl:    fenofibrate 160 MG tablet, Take 1 tablet (160 mg total) by mouth daily., Disp: 90 tablet, Rfl: 1   FEROSUL 325 (65 Fe)  MG tablet, Take 1 tablet (325 mg total) by mouth daily., Disp: 90 tablet, Rfl: 1   fexofenadine-pseudoephedrine (ALLEGRA-D) 60-120 MG 12 hr tablet, Take 1 tablet by mouth daily as needed., Disp: , Rfl:    fluticasone (FLOVENT HFA) 44 MCG/ACT inhaler, Inhale 2 puffs into the lungs in the morning and at bedtime., Disp: 1 each, Rfl: 12   furosemide (LASIX) 20 MG tablet, TAKE 1 TABLET BY MOUTH DAILY, Disp: 90 tablet, Rfl: 3   insulin degludec (TRESIBA FLEXTOUCH) 200 UNIT/ML FlexTouch Pen, Inject 120 Units into the skin. , Disp: , Rfl:    Insulin Pen Needle (BD PEN NEEDLE NANO U/F)  32G X 4 MM MISC, USE WITH LEVEMIR FLEXTOUCH DAILY, Disp: 200 each, Rfl: 2   latanoprost (XALATAN) 0.005 % ophthalmic solution, Place 1 drop into both eyes at bedtime., Disp: , Rfl:    midodrine (PROAMATINE) 5 MG tablet, Take 5 mg by mouth 3 (three) times daily., Disp: , Rfl:    Misc. Devices MISC, DX: Severe sleep apnea Start bipap 16/12 cm. Water with mask and supplies, Disp: , Rfl:    Multiple Vitamins-Minerals (MULTIVITAMIN ADULT, MINERALS, PO), Take 1 tablet by mouth., Disp: , Rfl:    NONFORMULARY OR COMPOUNDED ITEM, Please provide rolling walker.  Diagnosis: Gait instability, Disp: 1 each, Rfl: 0   nystatin-triamcinolone (MYCOLOG II) cream, APPLY TO AFFECTED AREA(S)  TOPICALLY TWICE DAILY, Disp: 30 g, Rfl: 1   omeprazole (PRILOSEC) 20 MG capsule, TAKE 1 CAPSULE BY MOUTH DAILY, Disp: 90 capsule, Rfl: 3   pregabalin (LYRICA) 200 MG capsule, Take 200 mg by mouth 2 (two) times daily. , Disp: , Rfl:    Probiotic Product (PROBIOTIC PO), Take 1 tablet by mouth daily., Disp: , Rfl:    rosuvastatin (CRESTOR) 10 MG tablet, Take 1 tablet (10 mg total) by mouth daily. Needs labs., Disp: 90 tablet, Rfl: 0   STOOL SOFTENER/LAXATIVE 50-8.6 MG tablet, Take 2 tablets by mouth daily., Disp: 180 tablet, Rfl: 1   tadalafil (CIALIS) 20 MG tablet, Take 0.5-1 tablets (10-20 mg total) by mouth every other day as needed (ED). Replaces sildenafil  as tadalafil recently approved., Disp: 45 tablet, Rfl: 3   tamsulosin (FLOMAX) 0.4 MG CAPS capsule, TAKE 1 CAPSULE BY MOUTH DAILY, Disp: 90 capsule, Rfl: 3   valACYclovir (VALTREX) 500 MG tablet, Take 500 mg by mouth daily., Disp: , Rfl:    WIXELA INHUB 250-50 MCG/ACT AEPB, USE 1 INHALATION BY MOUTH IN THE MORNING AND AT BEDTIME, Disp: 180 each, Rfl: 3   fluticasone (FLONASE) 50 MCG/ACT nasal spray, Place 2 sprays into both nostrils daily., Disp: 1 g, Rfl: 0   sertraline (ZOLOFT) 50 MG tablet, Take 1 tablet (50 mg total) by mouth daily., Disp: 90 tablet, Rfl: 3  EXAM:  VITALS per patient if applicable: Ht 5\' 9"  (1.753 m)   Wt 235 lb 3.2 oz (106.7 kg)   BMI 34.73 kg/m   GENERAL: alert, oriented, appears well and in no acute distress  HEENT: atraumatic, conjunttiva clear, no obvious abnormalities on inspection of external nose and ears  NECK: normal movements of the head and neck  LUNGS: on inspection no signs of respiratory distress, breathing rate appears normal, no obvious gross SOB, gasping or wheezing  CV: no obvious cyanosis  MS: moves all visible extremities without noticeable abnormality  PSYCH/NEURO: pleasant and cooperative, no obvious depression or anxiety, speech and thought processing grossly intact  ASSESSMENT AND PLAN:  Discussed the following assessment and plan:  Major depression He is doing well with sertraline at current strength.  Will plan to continue, refill sent in.       I discussed the assessment and treatment plan with the patient. The patient was provided an opportunity to ask questions and all were answered. The patient agreed with the plan and demonstrated an understanding of the instructions.   The patient was advised to call back or seek an in-person evaluation if the symptoms worsen or if the condition fails to improve as anticipated.    Randall Coombe, DO

## 2023-03-11 ENCOUNTER — Other Ambulatory Visit: Payer: Self-pay | Admitting: Family Medicine

## 2023-03-11 DIAGNOSIS — E782 Mixed hyperlipidemia: Secondary | ICD-10-CM

## 2023-03-13 DIAGNOSIS — G8929 Other chronic pain: Secondary | ICD-10-CM | POA: Diagnosis not present

## 2023-03-13 DIAGNOSIS — M5441 Lumbago with sciatica, right side: Secondary | ICD-10-CM | POA: Diagnosis not present

## 2023-03-13 DIAGNOSIS — M25511 Pain in right shoulder: Secondary | ICD-10-CM | POA: Diagnosis not present

## 2023-03-24 ENCOUNTER — Ambulatory Visit
Admission: EM | Admit: 2023-03-24 | Discharge: 2023-03-24 | Disposition: A | Discharge status: Home / Self Care | Payer: Medicare Other | Attending: Emergency Medicine | Admitting: Emergency Medicine

## 2023-03-24 ENCOUNTER — Encounter: Payer: Self-pay | Admitting: Emergency Medicine

## 2023-03-24 DIAGNOSIS — N3000 Acute cystitis without hematuria: Secondary | ICD-10-CM | POA: Diagnosis not present

## 2023-03-24 LAB — POCT URINALYSIS DIP (MANUAL ENTRY)
Bilirubin, UA: NEGATIVE
Blood, UA: NEGATIVE
Glucose, UA: NEGATIVE mg/dL
Ketones, POC UA: NEGATIVE mg/dL
Nitrite, UA: POSITIVE — AB
Protein Ur, POC: NEGATIVE mg/dL
Spec Grav, UA: 1.02 (ref 1.010–1.025)
Urobilinogen, UA: 0.2 E.U./dL
pH, UA: 5 (ref 5.0–8.0)

## 2023-03-24 MED ORDER — CEPHALEXIN 500 MG PO CAPS: 500.0000 mg | ORAL_CAPSULE | Freq: Two times a day (BID) | ORAL | 0 refills | Status: AC

## 2023-03-24 NOTE — ED Provider Notes (Signed)
[facility]    CSN: [id number] Arrival date & time: 03/24/23  1517     History   Chief Complaint Chief Complaint  Patient presents with   Dysuria    HPI Randall Fisher is a 67 y.o. male.  3 day history of dysuria, urine odor and frequency History of retention and self-catheterizes at home Denies hematuria, abd pain, fever, flank pain Follows with urology  Seen at urgent care 1 month ago for same and put on bactrim Was seen in march as well, also bactrim  Hx prostate cancer  Wife reports he does not drink much water  Past Medical History:  Diagnosis Date   Anxiety October 2020   Back pain    Depression October 2020   Diabetes mellitus without complication (HCC)    GERD (gastroesophageal reflux disease) Unknown   Glaucoma January 2021   Hyperlipidemia    Joint pain    Kidney disease    Leg swelling    Lung cancer (HCC)    Memory loss    Neuropathy    Numbness and tingling in both hands    Parkinson's disease 2021   S/P lobectomy of lung 2013   RUL for NSCLC   Sleep apnea Unknown    Patient Active Problem List   Diagnosis Date Noted   Cellulitis of buttock 10/24/2022   Pneumonia 09/29/2022   Dyspnea 07/14/2022   Fatty liver 03/20/2022   Malignant neoplasm of prostate (HCC) 01/14/2022   Impaired mobility and ADLs 01/09/2022   Vision loss 12/31/2021   Postobstructive pneumonia 07/26/2021   Multiple system atrophy, 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   Parkinsonism 01/02/2021   Mild cognitive disorder 10/16/2020   Cyst of kidney, acquired 10/16/2020   Angina pectoris (HCC) 10/16/2020   Abnormal gait 10/16/2020   Spondylosis without myelopathy or radiculopathy, lumbar region 10/16/2020   Urinary tract infection 09/24/2020   Right carpal tunnel syndrome 08/28/2020   Major depression 04/18/2020   Venous insufficiency 04/18/2020   Obstructive sleep apnea syndrome 02/24/2020   Non-small cell lung cancer (HCC) 12/05/2019   Mild cognitive  impairment 12/05/2019   Gastro-esophageal reflux disease without esophagitis 12/01/2019   Hypertension 12/01/2019   Benign prostatic hyperplasia with lower urinary tract symptoms 07/05/2019   Diabetic peripheral neuropathy associated with type 2 diabetes mellitus (HCC) 07/05/2019   Diabetic renal disease (HCC) 07/05/2019   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/05/2019   Stage 3 chronic kidney disease (HCC) 07/05/2019   Mild major depression, single episode (HCC) 07/05/2019   Herpetic gingivostomatitis 07/05/2019   Functional dyspepsia 07/05/2019   DM (diabetes mellitus) (HCC) 02/17/2013   HLD (hyperlipidemia) 02/17/2013    Past Surgical History:  Procedure Laterality Date   CARPAL TUNNEL RELEASE Bilateral    LUNG LOBECTOMY Right 2013   TONSILLECTOMY         Home Medications    Prior to Admission medications   Medication Sig Start Date End Date Taking? Authorizing Provider  albuterol (PROVENTIL) (2.5 MG/3ML) 0.083% nebulizer solution USE 1 VIAL VIA NEBULIZER EVERY 6 HOURS AS NEEDED FOR WHEEZING OR  SHORTNESS OF BREATH 02/19/23  Yes Everrett Coombe, DO  AMBULATORY NON FORMULARY MEDICATION Please provide lightweight transport chair.  Patient unable to propel himself in standard manual wheelchair.  Wife has trouble pushing standard wheelchair and moving in/out vehicle.  Diagnosis: Parkinsonism- G20, Gait Abnormality-R26.9 12/31/21  Yes Ashley Royalty, Cody, DO  budesonide (PULMICORT) 0.5 MG/2ML nebulizer solution Take 0.5 mg by nebulization daily as needed. 08/31/21  Yes  Clayborne Dana, NP  carbidopa-levodopa (SINEMET IR) 25-100 MG tablet Take 2 tablets by mouth 3 (three) times daily. 06/27/21  Yes Levert Feinstein, MD  cephALEXin (KEFLEX) 500 MG capsule Take 1 capsule (500 mg total) by mouth 2 (two) times daily for 7 days. 03/24/23 03/31/23 Yes Suhaas Agena, Lurena Joiner, PA-C  clotrimazole-betamethasone (LOTRISONE) cream Apply 1 Application topically daily. 04/10/22  Yes Everrett Coombe, DO   dorzolamide-timolol (COSOPT) 2-0.5 % ophthalmic solution Place 1 drop into the right eye 2 (two) times daily. 06/27/22  Yes [provider]  Dulaglutide (TRULICITY) 1.5 MG/0.5ML SOPN INJECT 1.5 MG(0.5ML) UNDER THE SKIN ONCE A WEEK. 11/28/19  Yes [provider]  fenofibrate 160 MG tablet Take 1 tablet (160 mg total) by mouth daily. 06/05/22  Yes Matthews, Cody, DO  FEROSUL 325 (65 Fe) MG tablet Take 1 tablet (325 mg total) by mouth daily. 10/01/22  Yes Everrett Coombe, DO  fexofenadine-pseudoephedrine (ALLEGRA-D) 60-120 MG 12 hr tablet Take 1 tablet by mouth daily as needed.   Yes [provider]  fluticasone (FLOVENT HFA) 44 MCG/ACT inhaler Inhale 2 puffs into the lungs in the morning and at bedtime. 01/16/21  Yes Hyman Hopes B, NP  furosemide (LASIX) 20 MG tablet TAKE 1 TABLET BY MOUTH DAILY 01/07/23  Yes Everrett Coombe, DO  insulin degludec (TRESIBA FLEXTOUCH) 200 UNIT/ML FlexTouch Pen Inject 120 Units into the skin.  11/28/19  Yes [provider]  Insulin Pen Needle (BD PEN NEEDLE NANO U/F) 32G X 4 MM MISC USE WITH LEVEMIR FLEXTOUCH DAILY 06/05/22  Yes Ashley Royalty, Cody, DO  latanoprost (XALATAN) 0.005 % ophthalmic solution Place 1 drop into both eyes at bedtime.   Yes [provider]  midodrine (PROAMATINE) 5 MG tablet Take 5 mg by mouth 3 (three) times daily. 02/19/23  Yes [provider]  Misc. Devices MISC DX: Severe sleep apnea Start bipap 16/12 cm. Water with mask and supplies 03/24/19  Yes [provider]  Multiple Vitamins-Minerals (MULTIVITAMIN ADULT, MINERALS, PO) Take 1 tablet by mouth.   Yes [provider]  NONFORMULARY OR COMPOUNDED ITEM Please provide rolling walker.  Diagnosis: Gait instability 12/01/19  Yes Everrett Coombe, DO  nystatin-triamcinolone [facility]) cream APPLY TO AFFECTED AREA(S)  TOPICALLY TWICE DAILY 04/01/22  Yes Everrett Coombe, DO  omeprazole (PRILOSEC) 20 MG capsule TAKE 1 CAPSULE BY MOUTH DAILY 01/07/23   Yes Everrett Coombe, DO  pregabalin (LYRICA) 200 MG capsule Take 200 mg by mouth 2 (two) times daily.  01/17/15  Yes [provider]  Probiotic Product (PROBIOTIC PO) Take 1 tablet by mouth daily.   Yes [provider]  rosuvastatin (CRESTOR) 10 MG tablet TAKE 1 TABLET BY MOUTH DAILY 03/11/23  Yes Everrett Coombe, DO  sertraline (ZOLOFT) 50 MG tablet Take 1 tablet (50 mg total) by mouth daily. 03/05/23  Yes Matthews, Cody, DO  STOOL SOFTENER/LAXATIVE 50-8.6 MG tablet Take 2 tablets by mouth daily. 10/01/22  Yes Everrett Coombe, DO  tadalafil (CIALIS) 20 MG tablet Take 0.5-1 tablets (10-20 mg total) by mouth every other day as needed (ED). Replaces sildenafil as tadalafil recently approved. 09/25/21  Yes Everrett Coombe, DO  tamsulosin (FLOMAX) 0.4 MG CAPS capsule TAKE 1 CAPSULE BY MOUTH DAILY 05/31/22  Yes Everrett Coombe, DO  valACYclovir (VALTREX) 500 MG tablet Take 500 mg by mouth daily. 06/08/21  Yes [provider]  Monte Fantasia INHUB 250-50 MCG/ACT AEPB USE 1 INHALATION BY MOUTH IN THE MORNING AND AT BEDTIME 11/27/21  Yes Everrett Coombe, DO  fluticasone [facility])  50 MCG/ACT nasal spray Place 2 sprays into both nostrils daily. 08/31/21 08/31/22  Clayborne Dana, NP    Family History Family History  Problem Relation Age of Onset   Diabetes Mother    Cancer Mother    Kidney failure Father    Kidney disease Father     Social History Social History   Tobacco Use   Smoking status: Former    Packs/day: 1.50    Years: 40.00    Additional pack years: 0.00    Total pack years: 60.00    Types: Cigarettes    Quit date: 11/15/2011    Years since quitting: 11.3   Smokeless tobacco: Never  Vaping Use   Vaping Use: Former  Substance Use Topics   Alcohol use: Not Currently    Comment: ocassionally   Drug use: Never     Allergies   Shellfish allergy and Mushroom extract complex   Review of Systems Review of Systems  Genitourinary:  Positive for dysuria.   Per  HPI  Physical Exam Triage Vital Signs ED Triage Vitals [03/24/23 1527]  Enc Vitals Group     BP 125/74     Pulse Rate 71     Resp 16     Temp 98.3 F (36.8 C)     Temp Source Oral     SpO2 100 %     Weight      Height      Head Circumference      Peak Flow      Pain Score      Pain Loc      Pain Edu?      Excl. in GC?    No data found.  Updated Vital Signs BP 125/74 (BP Location: Right Arm)   Pulse 71   Temp 98.3 F (36.8 C) (Oral)   Resp 16   SpO2 100%    Physical Exam Vitals and nursing note reviewed.  Constitutional:      General: He is not in acute distress.    Appearance: Normal appearance. He is not ill-appearing.  HENT:     Mouth/Throat:     Pharynx: Oropharynx is clear.  Cardiovascular:     Rate and Rhythm: Normal rate and regular rhythm.     Heart sounds: Normal heart sounds.  Pulmonary:     Effort: Pulmonary effort is normal.     Breath sounds: Normal breath sounds.  Abdominal:     Palpations: Abdomen is soft.     Tenderness: There is no abdominal tenderness. There is no right CVA tenderness, left CVA tenderness, guarding or rebound.  Skin:    General: Skin is warm and dry.  Neurological:     Mental Status: He is alert and oriented to person, place, and time.    UC Treatments / Results  Labs (all labs ordered are listed, but only abnormal results are displayed) Labs Reviewed  POCT URINALYSIS DIP (MANUAL ENTRY) - Abnormal; Notable for the following components:      Result Value   Nitrite, UA Positive (*)    Leukocytes, UA Small (1+) (*)    All other components within normal limits  URINE CULTURE    EKG  Radiology No results found.  Procedures Procedures (including critical care time)  Medications Ordered in UC Medications - No data to display  Initial Impression / Assessment and Plan / UC Course  I have reviewed the triage vital signs and the nursing notes.  Pertinent labs & imaging results that  were available during my care  of the patient were reviewed by me and considered in my medical decision making (see chart for details).  Afebrile, normal exam UA small leuks, +nitrates Culture pending With patient history will treat with keflex BID x 7 days Discussed follow up with primary/urology as soon as able. Return and ED precautions discussed. Patient agrees to plan, no questions at this time.   Final Clinical Impressions(s) / UC Diagnoses   Final diagnoses:  Acute cystitis without hematuria     Discharge Instructions      We will call you if anything on urine culture requires a change in therapy (about 2-3 days)  In the meantime I am treating you for a urinary tract infection. Please take the antibiotic Keflex as prescribed, with food to avoid upset stomach. Drink lots of fluids! At least 64 oz daily  Please call your urologist and make an appointment as soon as possible.      ED Prescriptions     Medication Sig Dispense Auth. Provider   cephALEXin (KEFLEX) 500 MG capsule Take 1 capsule (500 mg total) by mouth 2 (two) times daily for 7 days. 14 capsule Devetta Hagenow, Lurena Joiner, PA-C      PDMP not reviewed this encounter.   Meghanne Pletz, Lurena Joiner, [place] 03/24/23 1645

## 2023-03-24 NOTE — ED Triage Notes (Signed)
Patient c/o dysuria and dark urine for 3-4 days.  Urine does have an odor to it.  Patient will self cath for specimen.

## 2023-03-24 NOTE — Discharge Instructions (Addendum)
We will call you if anything on urine culture requires a change in therapy (about 2-3 days)  In the meantime I am treating you for a urinary tract infection. Please take the antibiotic Keflex as prescribed, with food to avoid upset stomach. Drink lots of fluids! At least 64 oz daily  Please call your urologist and make an appointment as soon as possible.

## 2023-03-25 LAB — URINE CULTURE: Culture: >=100000 — AB

## 2023-03-26 LAB — URINE CULTURE

## 2023-03-31 DIAGNOSIS — G8929 Other chronic pain: Secondary | ICD-10-CM | POA: Diagnosis not present

## 2023-03-31 DIAGNOSIS — M170 Bilateral primary osteoarthritis of knee: Secondary | ICD-10-CM | POA: Diagnosis not present

## 2023-03-31 DIAGNOSIS — M7061 Trochanteric bursitis, right hip: Secondary | ICD-10-CM | POA: Diagnosis not present

## 2023-03-31 DIAGNOSIS — M7062 Trochanteric bursitis, left hip: Secondary | ICD-10-CM | POA: Diagnosis not present

## 2023-03-31 DIAGNOSIS — M25562 Pain in left knee: Secondary | ICD-10-CM | POA: Diagnosis not present

## 2023-03-31 DIAGNOSIS — M25561 Pain in right knee: Secondary | ICD-10-CM | POA: Diagnosis not present

## 2023-04-01 DIAGNOSIS — J984 Other disorders of lung: Secondary | ICD-10-CM | POA: Diagnosis not present

## 2023-04-01 DIAGNOSIS — J986 Disorders of diaphragm: Secondary | ICD-10-CM | POA: Diagnosis not present

## 2023-04-01 DIAGNOSIS — C3492 Malignant neoplasm of unspecified part of left bronchus or lung: Secondary | ICD-10-CM | POA: Diagnosis not present

## 2023-04-01 DIAGNOSIS — R0602 Shortness of breath: Secondary | ICD-10-CM | POA: Diagnosis not present

## 2023-04-01 DIAGNOSIS — F17211 Nicotine dependence, cigarettes, in remission: Secondary | ICD-10-CM | POA: Diagnosis not present

## 2023-04-01 DIAGNOSIS — Z9989 Dependence on other enabling machines and devices: Secondary | ICD-10-CM | POA: Diagnosis not present

## 2023-04-01 DIAGNOSIS — J4540 Moderate persistent asthma, uncomplicated: Secondary | ICD-10-CM | POA: Diagnosis not present

## 2023-04-01 DIAGNOSIS — G4733 Obstructive sleep apnea (adult) (pediatric): Secondary | ICD-10-CM | POA: Diagnosis not present

## 2023-04-01 DIAGNOSIS — J189 Pneumonia, unspecified organism: Secondary | ICD-10-CM | POA: Diagnosis not present

## 2023-04-01 DIAGNOSIS — J9611 Chronic respiratory failure with hypoxia: Secondary | ICD-10-CM | POA: Diagnosis not present

## 2023-04-08 DIAGNOSIS — R339 Retention of urine, unspecified: Secondary | ICD-10-CM | POA: Diagnosis not present

## 2023-04-08 DIAGNOSIS — J9811 Atelectasis: Secondary | ICD-10-CM | POA: Diagnosis not present

## 2023-04-08 DIAGNOSIS — M1990 Unspecified osteoarthritis, unspecified site: Secondary | ICD-10-CM | POA: Diagnosis not present

## 2023-04-08 DIAGNOSIS — R300 Dysuria: Secondary | ICD-10-CM | POA: Diagnosis not present

## 2023-04-08 DIAGNOSIS — N390 Urinary tract infection, site not specified: Secondary | ICD-10-CM | POA: Diagnosis not present

## 2023-04-08 DIAGNOSIS — F411 Generalized anxiety disorder: Secondary | ICD-10-CM | POA: Diagnosis not present

## 2023-04-08 DIAGNOSIS — N281 Cyst of kidney, acquired: Secondary | ICD-10-CM | POA: Diagnosis not present

## 2023-04-08 DIAGNOSIS — Z87891 Personal history of nicotine dependence: Secondary | ICD-10-CM | POA: Diagnosis not present

## 2023-04-08 DIAGNOSIS — N3289 Other specified disorders of bladder: Secondary | ICD-10-CM | POA: Diagnosis not present

## 2023-04-08 DIAGNOSIS — J8410 Pulmonary fibrosis, unspecified: Secondary | ICD-10-CM | POA: Diagnosis not present

## 2023-04-08 DIAGNOSIS — R0602 Shortness of breath: Secondary | ICD-10-CM | POA: Diagnosis not present

## 2023-04-08 DIAGNOSIS — Z743 Need for continuous supervision: Secondary | ICD-10-CM | POA: Diagnosis not present

## 2023-04-08 DIAGNOSIS — R6889 Other general symptoms and signs: Secondary | ICD-10-CM | POA: Diagnosis not present

## 2023-04-08 DIAGNOSIS — R531 Weakness: Secondary | ICD-10-CM | POA: Diagnosis not present

## 2023-04-08 DIAGNOSIS — N179 Acute kidney failure, unspecified: Secondary | ICD-10-CM | POA: Diagnosis not present

## 2023-04-08 DIAGNOSIS — R918 Other nonspecific abnormal finding of lung field: Secondary | ICD-10-CM | POA: Diagnosis not present

## 2023-04-08 DIAGNOSIS — R079 Chest pain, unspecified: Secondary | ICD-10-CM | POA: Diagnosis not present

## 2023-04-08 DIAGNOSIS — R109 Unspecified abdominal pain: Secondary | ICD-10-CM | POA: Diagnosis not present

## 2023-04-08 DIAGNOSIS — R404 Transient alteration of awareness: Secondary | ICD-10-CM | POA: Diagnosis not present

## 2023-04-08 DIAGNOSIS — K753 Granulomatous hepatitis, not elsewhere classified: Secondary | ICD-10-CM | POA: Diagnosis not present

## 2023-04-08 DIAGNOSIS — J189 Pneumonia, unspecified organism: Secondary | ICD-10-CM | POA: Diagnosis not present

## 2023-04-09 DIAGNOSIS — I129 Hypertensive chronic kidney disease with stage 1 through stage 4 chronic kidney disease, or unspecified chronic kidney disease: Secondary | ICD-10-CM | POA: Diagnosis present

## 2023-04-09 DIAGNOSIS — E1122 Type 2 diabetes mellitus with diabetic chronic kidney disease: Secondary | ICD-10-CM | POA: Diagnosis present

## 2023-04-09 DIAGNOSIS — R5381 Other malaise: Secondary | ICD-10-CM | POA: Diagnosis present

## 2023-04-09 DIAGNOSIS — E669 Obesity, unspecified: Secondary | ICD-10-CM | POA: Diagnosis present

## 2023-04-09 DIAGNOSIS — R531 Weakness: Secondary | ICD-10-CM | POA: Diagnosis not present

## 2023-04-09 DIAGNOSIS — R059 Cough, unspecified: Secondary | ICD-10-CM | POA: Diagnosis not present

## 2023-04-09 DIAGNOSIS — N3000 Acute cystitis without hematuria: Secondary | ICD-10-CM | POA: Diagnosis not present

## 2023-04-09 DIAGNOSIS — I10 Essential (primary) hypertension: Secondary | ICD-10-CM | POA: Diagnosis not present

## 2023-04-09 DIAGNOSIS — I951 Orthostatic hypotension: Secondary | ICD-10-CM | POA: Diagnosis present

## 2023-04-09 DIAGNOSIS — N390 Urinary tract infection, site not specified: Secondary | ICD-10-CM | POA: Diagnosis present

## 2023-04-09 DIAGNOSIS — F411 Generalized anxiety disorder: Secondary | ICD-10-CM | POA: Diagnosis present

## 2023-04-09 DIAGNOSIS — R1310 Dysphagia, unspecified: Secondary | ICD-10-CM | POA: Diagnosis not present

## 2023-04-09 DIAGNOSIS — Z91018 Allergy to other foods: Secondary | ICD-10-CM | POA: Diagnosis not present

## 2023-04-09 DIAGNOSIS — R339 Retention of urine, unspecified: Secondary | ICD-10-CM | POA: Diagnosis present

## 2023-04-09 DIAGNOSIS — G20A1 Parkinson's disease without dyskinesia, without mention of fluctuations: Secondary | ICD-10-CM | POA: Diagnosis present

## 2023-04-09 DIAGNOSIS — Z91013 Allergy to seafood: Secondary | ICD-10-CM | POA: Diagnosis not present

## 2023-04-09 DIAGNOSIS — F0390 Unspecified dementia without behavioral disturbance: Secondary | ICD-10-CM | POA: Diagnosis present

## 2023-04-09 DIAGNOSIS — Z7951 Long term (current) use of inhaled steroids: Secondary | ICD-10-CM | POA: Diagnosis not present

## 2023-04-09 DIAGNOSIS — E1165 Type 2 diabetes mellitus with hyperglycemia: Secondary | ICD-10-CM | POA: Diagnosis present

## 2023-04-09 DIAGNOSIS — M1990 Unspecified osteoarthritis, unspecified site: Secondary | ICD-10-CM | POA: Diagnosis present

## 2023-04-09 DIAGNOSIS — Z9989 Dependence on other enabling machines and devices: Secondary | ICD-10-CM | POA: Diagnosis not present

## 2023-04-09 DIAGNOSIS — N179 Acute kidney failure, unspecified: Secondary | ICD-10-CM | POA: Diagnosis present

## 2023-04-09 DIAGNOSIS — J189 Pneumonia, unspecified organism: Secondary | ICD-10-CM | POA: Diagnosis present

## 2023-04-09 DIAGNOSIS — N1831 Chronic kidney disease, stage 3a: Secondary | ICD-10-CM | POA: Diagnosis present

## 2023-04-09 DIAGNOSIS — G20C Parkinsonism, unspecified: Secondary | ICD-10-CM | POA: Diagnosis not present

## 2023-04-09 DIAGNOSIS — E785 Hyperlipidemia, unspecified: Secondary | ICD-10-CM | POA: Diagnosis present

## 2023-04-09 DIAGNOSIS — Z22322 Carrier or suspected carrier of Methicillin resistant Staphylococcus aureus: Secondary | ICD-10-CM | POA: Diagnosis not present

## 2023-04-09 DIAGNOSIS — Z7985 Long-term (current) use of injectable non-insulin antidiabetic drugs: Secondary | ICD-10-CM | POA: Diagnosis not present

## 2023-04-09 DIAGNOSIS — G4733 Obstructive sleep apnea (adult) (pediatric): Secondary | ICD-10-CM | POA: Diagnosis present

## 2023-04-09 DIAGNOSIS — C61 Malignant neoplasm of prostate: Secondary | ICD-10-CM | POA: Diagnosis present

## 2023-04-14 DIAGNOSIS — F0280 Dementia in other diseases classified elsewhere without behavioral disturbance: Secondary | ICD-10-CM | POA: Diagnosis not present

## 2023-04-14 DIAGNOSIS — R338 Other retention of urine: Secondary | ICD-10-CM | POA: Diagnosis not present

## 2023-04-14 DIAGNOSIS — J189 Pneumonia, unspecified organism: Secondary | ICD-10-CM | POA: Diagnosis not present

## 2023-04-14 DIAGNOSIS — N3000 Acute cystitis without hematuria: Secondary | ICD-10-CM | POA: Diagnosis not present

## 2023-04-14 DIAGNOSIS — G20C Parkinsonism, unspecified: Secondary | ICD-10-CM | POA: Diagnosis not present

## 2023-04-14 DIAGNOSIS — N401 Enlarged prostate with lower urinary tract symptoms: Secondary | ICD-10-CM | POA: Diagnosis not present

## 2023-04-14 DIAGNOSIS — Z466 Encounter for fitting and adjustment of urinary device: Secondary | ICD-10-CM | POA: Diagnosis not present

## 2023-04-14 DIAGNOSIS — N183 Chronic kidney disease, stage 3 unspecified: Secondary | ICD-10-CM | POA: Diagnosis not present

## 2023-04-14 DIAGNOSIS — G20A1 Parkinson's disease without dyskinesia, without mention of fluctuations: Secondary | ICD-10-CM | POA: Diagnosis not present

## 2023-04-14 DIAGNOSIS — N390 Urinary tract infection, site not specified: Secondary | ICD-10-CM | POA: Diagnosis not present

## 2023-04-14 DIAGNOSIS — R531 Weakness: Secondary | ICD-10-CM | POA: Diagnosis not present

## 2023-04-14 DIAGNOSIS — L89321 Pressure ulcer of left buttock, stage 1: Secondary | ICD-10-CM | POA: Diagnosis not present

## 2023-04-14 DIAGNOSIS — D649 Anemia, unspecified: Secondary | ICD-10-CM | POA: Diagnosis not present

## 2023-04-14 DIAGNOSIS — I951 Orthostatic hypotension: Secondary | ICD-10-CM | POA: Diagnosis not present

## 2023-04-14 DIAGNOSIS — G903 Multi-system degeneration of the autonomic nervous system: Secondary | ICD-10-CM | POA: Diagnosis not present

## 2023-04-14 DIAGNOSIS — E785 Hyperlipidemia, unspecified: Secondary | ICD-10-CM | POA: Diagnosis not present

## 2023-04-14 DIAGNOSIS — Z6833 Body mass index (BMI) 33.0-33.9, adult: Secondary | ICD-10-CM | POA: Diagnosis not present

## 2023-04-14 DIAGNOSIS — K219 Gastro-esophageal reflux disease without esophagitis: Secondary | ICD-10-CM | POA: Diagnosis not present

## 2023-04-14 DIAGNOSIS — G7281 Critical illness myopathy: Secondary | ICD-10-CM | POA: Diagnosis not present

## 2023-04-14 DIAGNOSIS — K5900 Constipation, unspecified: Secondary | ICD-10-CM | POA: Diagnosis not present

## 2023-04-14 DIAGNOSIS — Z22322 Carrier or suspected carrier of Methicillin resistant Staphylococcus aureus: Secondary | ICD-10-CM | POA: Diagnosis not present

## 2023-04-14 DIAGNOSIS — H409 Unspecified glaucoma: Secondary | ICD-10-CM | POA: Diagnosis not present

## 2023-04-14 DIAGNOSIS — E782 Mixed hyperlipidemia: Secondary | ICD-10-CM | POA: Diagnosis not present

## 2023-04-14 DIAGNOSIS — I129 Hypertensive chronic kidney disease with stage 1 through stage 4 chronic kidney disease, or unspecified chronic kidney disease: Secondary | ICD-10-CM | POA: Diagnosis not present

## 2023-04-14 DIAGNOSIS — C61 Malignant neoplasm of prostate: Secondary | ICD-10-CM | POA: Diagnosis not present

## 2023-04-14 DIAGNOSIS — R2689 Other abnormalities of gait and mobility: Secondary | ICD-10-CM | POA: Diagnosis not present

## 2023-04-14 DIAGNOSIS — N1831 Chronic kidney disease, stage 3a: Secondary | ICD-10-CM | POA: Diagnosis not present

## 2023-04-14 DIAGNOSIS — R339 Retention of urine, unspecified: Secondary | ICD-10-CM | POA: Diagnosis not present

## 2023-04-14 DIAGNOSIS — E669 Obesity, unspecified: Secondary | ICD-10-CM | POA: Diagnosis not present

## 2023-04-14 DIAGNOSIS — L89311 Pressure ulcer of right buttock, stage 1: Secondary | ICD-10-CM | POA: Diagnosis not present

## 2023-04-14 DIAGNOSIS — N179 Acute kidney failure, unspecified: Secondary | ICD-10-CM | POA: Diagnosis not present

## 2023-04-14 DIAGNOSIS — Z794 Long term (current) use of insulin: Secondary | ICD-10-CM | POA: Diagnosis not present

## 2023-04-14 DIAGNOSIS — E1165 Type 2 diabetes mellitus with hyperglycemia: Secondary | ICD-10-CM | POA: Diagnosis not present

## 2023-04-14 DIAGNOSIS — E1122 Type 2 diabetes mellitus with diabetic chronic kidney disease: Secondary | ICD-10-CM | POA: Diagnosis not present

## 2023-04-14 DIAGNOSIS — G629 Polyneuropathy, unspecified: Secondary | ICD-10-CM | POA: Diagnosis not present

## 2023-04-14 DIAGNOSIS — I10 Essential (primary) hypertension: Secondary | ICD-10-CM | POA: Diagnosis not present

## 2023-04-14 DIAGNOSIS — F32A Depression, unspecified: Secondary | ICD-10-CM | POA: Diagnosis not present

## 2023-04-15 DIAGNOSIS — I10 Essential (primary) hypertension: Secondary | ICD-10-CM | POA: Diagnosis not present

## 2023-04-15 DIAGNOSIS — N3000 Acute cystitis without hematuria: Secondary | ICD-10-CM | POA: Diagnosis not present

## 2023-04-15 DIAGNOSIS — G20C Parkinsonism, unspecified: Secondary | ICD-10-CM | POA: Diagnosis not present

## 2023-04-15 DIAGNOSIS — N1831 Chronic kidney disease, stage 3a: Secondary | ICD-10-CM | POA: Diagnosis not present

## 2023-04-15 DIAGNOSIS — E782 Mixed hyperlipidemia: Secondary | ICD-10-CM | POA: Diagnosis not present

## 2023-04-15 DIAGNOSIS — E1122 Type 2 diabetes mellitus with diabetic chronic kidney disease: Secondary | ICD-10-CM | POA: Diagnosis not present

## 2023-04-15 DIAGNOSIS — N183 Chronic kidney disease, stage 3 unspecified: Secondary | ICD-10-CM | POA: Diagnosis not present

## 2023-04-15 DIAGNOSIS — J189 Pneumonia, unspecified organism: Secondary | ICD-10-CM | POA: Diagnosis not present

## 2023-04-15 DIAGNOSIS — Z794 Long term (current) use of insulin: Secondary | ICD-10-CM | POA: Diagnosis not present

## 2023-04-16 DIAGNOSIS — E1165 Type 2 diabetes mellitus with hyperglycemia: Secondary | ICD-10-CM | POA: Diagnosis not present

## 2023-04-16 DIAGNOSIS — G20A1 Parkinson's disease without dyskinesia, without mention of fluctuations: Secondary | ICD-10-CM | POA: Diagnosis not present

## 2023-04-16 DIAGNOSIS — I10 Essential (primary) hypertension: Secondary | ICD-10-CM | POA: Diagnosis not present

## 2023-04-16 DIAGNOSIS — I951 Orthostatic hypotension: Secondary | ICD-10-CM | POA: Diagnosis not present

## 2023-04-17 DIAGNOSIS — G20A1 Parkinson's disease without dyskinesia, without mention of fluctuations: Secondary | ICD-10-CM | POA: Diagnosis not present

## 2023-04-17 DIAGNOSIS — E1165 Type 2 diabetes mellitus with hyperglycemia: Secondary | ICD-10-CM | POA: Diagnosis not present

## 2023-04-17 DIAGNOSIS — I951 Orthostatic hypotension: Secondary | ICD-10-CM | POA: Diagnosis not present

## 2023-04-17 DIAGNOSIS — I10 Essential (primary) hypertension: Secondary | ICD-10-CM | POA: Diagnosis not present

## 2023-04-18 DIAGNOSIS — G20A1 Parkinson's disease without dyskinesia, without mention of fluctuations: Secondary | ICD-10-CM | POA: Diagnosis not present

## 2023-04-18 DIAGNOSIS — I951 Orthostatic hypotension: Secondary | ICD-10-CM | POA: Diagnosis not present

## 2023-04-18 DIAGNOSIS — E1165 Type 2 diabetes mellitus with hyperglycemia: Secondary | ICD-10-CM | POA: Diagnosis not present

## 2023-04-18 DIAGNOSIS — I10 Essential (primary) hypertension: Secondary | ICD-10-CM | POA: Diagnosis not present

## 2023-04-20 DIAGNOSIS — E1165 Type 2 diabetes mellitus with hyperglycemia: Secondary | ICD-10-CM | POA: Diagnosis not present

## 2023-04-20 DIAGNOSIS — I951 Orthostatic hypotension: Secondary | ICD-10-CM | POA: Diagnosis not present

## 2023-04-20 DIAGNOSIS — G20A1 Parkinson's disease without dyskinesia, without mention of fluctuations: Secondary | ICD-10-CM | POA: Diagnosis not present

## 2023-04-20 DIAGNOSIS — I10 Essential (primary) hypertension: Secondary | ICD-10-CM | POA: Diagnosis not present

## 2023-04-21 DIAGNOSIS — E782 Mixed hyperlipidemia: Secondary | ICD-10-CM | POA: Diagnosis not present

## 2023-04-21 DIAGNOSIS — E1165 Type 2 diabetes mellitus with hyperglycemia: Secondary | ICD-10-CM | POA: Diagnosis not present

## 2023-04-21 DIAGNOSIS — I10 Essential (primary) hypertension: Secondary | ICD-10-CM | POA: Diagnosis not present

## 2023-04-21 DIAGNOSIS — G20A1 Parkinson's disease without dyskinesia, without mention of fluctuations: Secondary | ICD-10-CM | POA: Diagnosis not present

## 2023-04-21 DIAGNOSIS — Z794 Long term (current) use of insulin: Secondary | ICD-10-CM | POA: Diagnosis not present

## 2023-04-21 DIAGNOSIS — E1122 Type 2 diabetes mellitus with diabetic chronic kidney disease: Secondary | ICD-10-CM | POA: Diagnosis not present

## 2023-04-21 DIAGNOSIS — N183 Chronic kidney disease, stage 3 unspecified: Secondary | ICD-10-CM | POA: Diagnosis not present

## 2023-04-21 DIAGNOSIS — E669 Obesity, unspecified: Secondary | ICD-10-CM | POA: Diagnosis not present

## 2023-04-21 DIAGNOSIS — I951 Orthostatic hypotension: Secondary | ICD-10-CM | POA: Diagnosis not present

## 2023-04-22 DIAGNOSIS — I951 Orthostatic hypotension: Secondary | ICD-10-CM | POA: Diagnosis not present

## 2023-04-22 DIAGNOSIS — E1165 Type 2 diabetes mellitus with hyperglycemia: Secondary | ICD-10-CM | POA: Diagnosis not present

## 2023-04-22 DIAGNOSIS — I10 Essential (primary) hypertension: Secondary | ICD-10-CM | POA: Diagnosis not present

## 2023-04-22 DIAGNOSIS — G20A1 Parkinson's disease without dyskinesia, without mention of fluctuations: Secondary | ICD-10-CM | POA: Diagnosis not present

## 2023-04-23 DIAGNOSIS — E1165 Type 2 diabetes mellitus with hyperglycemia: Secondary | ICD-10-CM | POA: Diagnosis not present

## 2023-04-23 DIAGNOSIS — I10 Essential (primary) hypertension: Secondary | ICD-10-CM | POA: Diagnosis not present

## 2023-04-23 DIAGNOSIS — G20A1 Parkinson's disease without dyskinesia, without mention of fluctuations: Secondary | ICD-10-CM | POA: Diagnosis not present

## 2023-04-23 DIAGNOSIS — I951 Orthostatic hypotension: Secondary | ICD-10-CM | POA: Diagnosis not present

## 2023-04-24 DIAGNOSIS — G20C Parkinsonism, unspecified: Secondary | ICD-10-CM | POA: Diagnosis not present

## 2023-04-24 DIAGNOSIS — I10 Essential (primary) hypertension: Secondary | ICD-10-CM | POA: Diagnosis not present

## 2023-04-24 DIAGNOSIS — J189 Pneumonia, unspecified organism: Secondary | ICD-10-CM | POA: Diagnosis not present

## 2023-04-24 DIAGNOSIS — R338 Other retention of urine: Secondary | ICD-10-CM | POA: Diagnosis not present

## 2023-04-24 DIAGNOSIS — N401 Enlarged prostate with lower urinary tract symptoms: Secondary | ICD-10-CM | POA: Diagnosis not present

## 2023-04-24 DIAGNOSIS — I951 Orthostatic hypotension: Secondary | ICD-10-CM | POA: Diagnosis not present

## 2023-04-24 DIAGNOSIS — G20A1 Parkinson's disease without dyskinesia, without mention of fluctuations: Secondary | ICD-10-CM | POA: Diagnosis not present

## 2023-04-24 DIAGNOSIS — D649 Anemia, unspecified: Secondary | ICD-10-CM | POA: Diagnosis not present

## 2023-04-24 DIAGNOSIS — E1165 Type 2 diabetes mellitus with hyperglycemia: Secondary | ICD-10-CM | POA: Diagnosis not present

## 2023-04-25 DIAGNOSIS — I10 Essential (primary) hypertension: Secondary | ICD-10-CM | POA: Diagnosis not present

## 2023-04-25 DIAGNOSIS — E1165 Type 2 diabetes mellitus with hyperglycemia: Secondary | ICD-10-CM | POA: Diagnosis not present

## 2023-04-25 DIAGNOSIS — G20A1 Parkinson's disease without dyskinesia, without mention of fluctuations: Secondary | ICD-10-CM | POA: Diagnosis not present

## 2023-04-25 DIAGNOSIS — I951 Orthostatic hypotension: Secondary | ICD-10-CM | POA: Diagnosis not present

## 2023-04-30 ENCOUNTER — Other Ambulatory Visit: Payer: Self-pay | Admitting: Family Medicine

## 2023-04-30 DIAGNOSIS — G20C Parkinsonism, unspecified: Secondary | ICD-10-CM

## 2023-04-30 DIAGNOSIS — G903 Multi-system degeneration of the autonomic nervous system: Secondary | ICD-10-CM | POA: Diagnosis not present

## 2023-04-30 DIAGNOSIS — E118 Type 2 diabetes mellitus with unspecified complications: Secondary | ICD-10-CM | POA: Diagnosis not present

## 2023-04-30 DIAGNOSIS — N400 Enlarged prostate without lower urinary tract symptoms: Secondary | ICD-10-CM | POA: Diagnosis not present

## 2023-04-30 DIAGNOSIS — N183 Chronic kidney disease, stage 3 unspecified: Secondary | ICD-10-CM | POA: Diagnosis not present

## 2023-04-30 DIAGNOSIS — C61 Malignant neoplasm of prostate: Secondary | ICD-10-CM | POA: Diagnosis not present

## 2023-04-30 DIAGNOSIS — F32A Depression, unspecified: Secondary | ICD-10-CM | POA: Diagnosis not present

## 2023-04-30 DIAGNOSIS — Z515 Encounter for palliative care: Secondary | ICD-10-CM | POA: Diagnosis not present

## 2023-04-30 DIAGNOSIS — Z902 Acquired absence of lung [part of]: Secondary | ICD-10-CM | POA: Diagnosis not present

## 2023-04-30 DIAGNOSIS — R339 Retention of urine, unspecified: Secondary | ICD-10-CM | POA: Diagnosis not present

## 2023-04-30 DIAGNOSIS — I10 Essential (primary) hypertension: Secondary | ICD-10-CM | POA: Diagnosis not present

## 2023-04-30 DIAGNOSIS — E785 Hyperlipidemia, unspecified: Secondary | ICD-10-CM | POA: Diagnosis not present

## 2023-04-30 DIAGNOSIS — G609 Hereditary and idiopathic neuropathy, unspecified: Secondary | ICD-10-CM | POA: Diagnosis not present

## 2023-04-30 DIAGNOSIS — H409 Unspecified glaucoma: Secondary | ICD-10-CM | POA: Diagnosis not present

## 2023-04-30 DIAGNOSIS — Z85118 Personal history of other malignant neoplasm of bronchus and lung: Secondary | ICD-10-CM | POA: Diagnosis not present

## 2023-04-30 DIAGNOSIS — G4730 Sleep apnea, unspecified: Secondary | ICD-10-CM | POA: Diagnosis not present

## 2023-04-30 DIAGNOSIS — Z7409 Other reduced mobility: Secondary | ICD-10-CM

## 2023-04-30 DIAGNOSIS — K219 Gastro-esophageal reflux disease without esophagitis: Secondary | ICD-10-CM | POA: Diagnosis not present

## 2023-04-30 DIAGNOSIS — F0280 Dementia in other diseases classified elsewhere without behavioral disturbance: Secondary | ICD-10-CM | POA: Diagnosis not present

## 2023-05-01 DIAGNOSIS — E118 Type 2 diabetes mellitus with unspecified complications: Secondary | ICD-10-CM | POA: Diagnosis not present

## 2023-05-01 DIAGNOSIS — I10 Essential (primary) hypertension: Secondary | ICD-10-CM | POA: Diagnosis not present

## 2023-05-01 DIAGNOSIS — G903 Multi-system degeneration of the autonomic nervous system: Secondary | ICD-10-CM | POA: Diagnosis not present

## 2023-05-01 DIAGNOSIS — E785 Hyperlipidemia, unspecified: Secondary | ICD-10-CM | POA: Diagnosis not present

## 2023-05-01 DIAGNOSIS — G20C Parkinsonism, unspecified: Secondary | ICD-10-CM | POA: Diagnosis not present

## 2023-05-01 DIAGNOSIS — F0280 Dementia in other diseases classified elsewhere without behavioral disturbance: Secondary | ICD-10-CM | POA: Diagnosis not present

## 2023-05-02 DIAGNOSIS — G20C Parkinsonism, unspecified: Secondary | ICD-10-CM | POA: Diagnosis not present

## 2023-05-02 DIAGNOSIS — E118 Type 2 diabetes mellitus with unspecified complications: Secondary | ICD-10-CM | POA: Diagnosis not present

## 2023-05-02 DIAGNOSIS — E785 Hyperlipidemia, unspecified: Secondary | ICD-10-CM | POA: Diagnosis not present

## 2023-05-02 DIAGNOSIS — I10 Essential (primary) hypertension: Secondary | ICD-10-CM | POA: Diagnosis not present

## 2023-05-02 DIAGNOSIS — F0280 Dementia in other diseases classified elsewhere without behavioral disturbance: Secondary | ICD-10-CM | POA: Diagnosis not present

## 2023-05-02 DIAGNOSIS — G903 Multi-system degeneration of the autonomic nervous system: Secondary | ICD-10-CM | POA: Diagnosis not present

## 2023-05-05 ENCOUNTER — Inpatient Hospital Stay: Payer: Medicare Other | Admitting: Family Medicine

## 2023-05-08 DIAGNOSIS — E118 Type 2 diabetes mellitus with unspecified complications: Secondary | ICD-10-CM | POA: Diagnosis not present

## 2023-05-08 DIAGNOSIS — F0280 Dementia in other diseases classified elsewhere without behavioral disturbance: Secondary | ICD-10-CM | POA: Diagnosis not present

## 2023-05-08 DIAGNOSIS — G903 Multi-system degeneration of the autonomic nervous system: Secondary | ICD-10-CM | POA: Diagnosis not present

## 2023-05-08 DIAGNOSIS — I10 Essential (primary) hypertension: Secondary | ICD-10-CM | POA: Diagnosis not present

## 2023-05-08 DIAGNOSIS — E785 Hyperlipidemia, unspecified: Secondary | ICD-10-CM | POA: Diagnosis not present

## 2023-05-08 DIAGNOSIS — G20C Parkinsonism, unspecified: Secondary | ICD-10-CM | POA: Diagnosis not present

## 2023-05-09 DIAGNOSIS — E118 Type 2 diabetes mellitus with unspecified complications: Secondary | ICD-10-CM | POA: Diagnosis not present

## 2023-05-09 DIAGNOSIS — F0280 Dementia in other diseases classified elsewhere without behavioral disturbance: Secondary | ICD-10-CM | POA: Diagnosis not present

## 2023-05-09 DIAGNOSIS — I10 Essential (primary) hypertension: Secondary | ICD-10-CM | POA: Diagnosis not present

## 2023-05-09 DIAGNOSIS — G20C Parkinsonism, unspecified: Secondary | ICD-10-CM | POA: Diagnosis not present

## 2023-05-09 DIAGNOSIS — G903 Multi-system degeneration of the autonomic nervous system: Secondary | ICD-10-CM | POA: Diagnosis not present

## 2023-05-09 DIAGNOSIS — E785 Hyperlipidemia, unspecified: Secondary | ICD-10-CM | POA: Diagnosis not present

## 2023-05-13 DIAGNOSIS — E785 Hyperlipidemia, unspecified: Secondary | ICD-10-CM | POA: Diagnosis not present

## 2023-05-13 DIAGNOSIS — G20C Parkinsonism, unspecified: Secondary | ICD-10-CM | POA: Diagnosis not present

## 2023-05-13 DIAGNOSIS — F0280 Dementia in other diseases classified elsewhere without behavioral disturbance: Secondary | ICD-10-CM | POA: Diagnosis not present

## 2023-05-13 DIAGNOSIS — G903 Multi-system degeneration of the autonomic nervous system: Secondary | ICD-10-CM | POA: Diagnosis not present

## 2023-05-13 DIAGNOSIS — I10 Essential (primary) hypertension: Secondary | ICD-10-CM | POA: Diagnosis not present

## 2023-05-13 DIAGNOSIS — E118 Type 2 diabetes mellitus with unspecified complications: Secondary | ICD-10-CM | POA: Diagnosis not present

## 2023-05-14 DIAGNOSIS — G20C Parkinsonism, unspecified: Secondary | ICD-10-CM | POA: Diagnosis not present

## 2023-05-14 DIAGNOSIS — I10 Essential (primary) hypertension: Secondary | ICD-10-CM | POA: Diagnosis not present

## 2023-05-14 DIAGNOSIS — E785 Hyperlipidemia, unspecified: Secondary | ICD-10-CM | POA: Diagnosis not present

## 2023-05-14 DIAGNOSIS — G903 Multi-system degeneration of the autonomic nervous system: Secondary | ICD-10-CM | POA: Diagnosis not present

## 2023-05-14 DIAGNOSIS — F0280 Dementia in other diseases classified elsewhere without behavioral disturbance: Secondary | ICD-10-CM | POA: Diagnosis not present

## 2023-05-14 DIAGNOSIS — E118 Type 2 diabetes mellitus with unspecified complications: Secondary | ICD-10-CM | POA: Diagnosis not present

## 2023-05-15 DIAGNOSIS — I10 Essential (primary) hypertension: Secondary | ICD-10-CM | POA: Diagnosis not present

## 2023-05-15 DIAGNOSIS — F0280 Dementia in other diseases classified elsewhere without behavioral disturbance: Secondary | ICD-10-CM | POA: Diagnosis not present

## 2023-05-15 DIAGNOSIS — G20C Parkinsonism, unspecified: Secondary | ICD-10-CM | POA: Diagnosis not present

## 2023-05-15 DIAGNOSIS — G903 Multi-system degeneration of the autonomic nervous system: Secondary | ICD-10-CM | POA: Diagnosis not present

## 2023-05-15 DIAGNOSIS — E785 Hyperlipidemia, unspecified: Secondary | ICD-10-CM | POA: Diagnosis not present

## 2023-05-15 DIAGNOSIS — E118 Type 2 diabetes mellitus with unspecified complications: Secondary | ICD-10-CM | POA: Diagnosis not present

## 2023-05-18 DIAGNOSIS — N183 Chronic kidney disease, stage 3 unspecified: Secondary | ICD-10-CM | POA: Diagnosis not present

## 2023-05-18 DIAGNOSIS — G4730 Sleep apnea, unspecified: Secondary | ICD-10-CM | POA: Diagnosis not present

## 2023-05-18 DIAGNOSIS — G609 Hereditary and idiopathic neuropathy, unspecified: Secondary | ICD-10-CM | POA: Diagnosis not present

## 2023-05-18 DIAGNOSIS — Z515 Encounter for palliative care: Secondary | ICD-10-CM | POA: Diagnosis not present

## 2023-05-18 DIAGNOSIS — E785 Hyperlipidemia, unspecified: Secondary | ICD-10-CM | POA: Diagnosis not present

## 2023-05-18 DIAGNOSIS — Z85118 Personal history of other malignant neoplasm of bronchus and lung: Secondary | ICD-10-CM | POA: Diagnosis not present

## 2023-05-18 DIAGNOSIS — G20C Parkinsonism, unspecified: Secondary | ICD-10-CM | POA: Diagnosis not present

## 2023-05-18 DIAGNOSIS — R339 Retention of urine, unspecified: Secondary | ICD-10-CM | POA: Diagnosis not present

## 2023-05-18 DIAGNOSIS — F32A Depression, unspecified: Secondary | ICD-10-CM | POA: Diagnosis not present

## 2023-05-18 DIAGNOSIS — F0280 Dementia in other diseases classified elsewhere without behavioral disturbance: Secondary | ICD-10-CM | POA: Diagnosis not present

## 2023-05-18 DIAGNOSIS — I10 Essential (primary) hypertension: Secondary | ICD-10-CM | POA: Diagnosis not present

## 2023-05-18 DIAGNOSIS — H409 Unspecified glaucoma: Secondary | ICD-10-CM | POA: Diagnosis not present

## 2023-05-18 DIAGNOSIS — C61 Malignant neoplasm of prostate: Secondary | ICD-10-CM | POA: Diagnosis not present

## 2023-05-18 DIAGNOSIS — G903 Multi-system degeneration of the autonomic nervous system: Secondary | ICD-10-CM | POA: Diagnosis not present

## 2023-05-18 DIAGNOSIS — K219 Gastro-esophageal reflux disease without esophagitis: Secondary | ICD-10-CM | POA: Diagnosis not present

## 2023-05-18 DIAGNOSIS — E118 Type 2 diabetes mellitus with unspecified complications: Secondary | ICD-10-CM | POA: Diagnosis not present

## 2023-05-18 DIAGNOSIS — Z902 Acquired absence of lung [part of]: Secondary | ICD-10-CM | POA: Diagnosis not present

## 2023-05-18 DIAGNOSIS — N400 Enlarged prostate without lower urinary tract symptoms: Secondary | ICD-10-CM | POA: Diagnosis not present

## 2023-05-20 DIAGNOSIS — G903 Multi-system degeneration of the autonomic nervous system: Secondary | ICD-10-CM | POA: Diagnosis not present

## 2023-05-20 DIAGNOSIS — E785 Hyperlipidemia, unspecified: Secondary | ICD-10-CM | POA: Diagnosis not present

## 2023-05-20 DIAGNOSIS — E118 Type 2 diabetes mellitus with unspecified complications: Secondary | ICD-10-CM | POA: Diagnosis not present

## 2023-05-20 DIAGNOSIS — F0280 Dementia in other diseases classified elsewhere without behavioral disturbance: Secondary | ICD-10-CM | POA: Diagnosis not present

## 2023-05-20 DIAGNOSIS — I10 Essential (primary) hypertension: Secondary | ICD-10-CM | POA: Diagnosis not present

## 2023-05-20 DIAGNOSIS — G20C Parkinsonism, unspecified: Secondary | ICD-10-CM | POA: Diagnosis not present

## 2023-05-22 DIAGNOSIS — E785 Hyperlipidemia, unspecified: Secondary | ICD-10-CM | POA: Diagnosis not present

## 2023-05-22 DIAGNOSIS — G20C Parkinsonism, unspecified: Secondary | ICD-10-CM | POA: Diagnosis not present

## 2023-05-22 DIAGNOSIS — E118 Type 2 diabetes mellitus with unspecified complications: Secondary | ICD-10-CM | POA: Diagnosis not present

## 2023-05-22 DIAGNOSIS — G903 Multi-system degeneration of the autonomic nervous system: Secondary | ICD-10-CM | POA: Diagnosis not present

## 2023-05-22 DIAGNOSIS — I10 Essential (primary) hypertension: Secondary | ICD-10-CM | POA: Diagnosis not present

## 2023-05-22 DIAGNOSIS — F0280 Dementia in other diseases classified elsewhere without behavioral disturbance: Secondary | ICD-10-CM | POA: Diagnosis not present

## 2023-05-23 DIAGNOSIS — F0280 Dementia in other diseases classified elsewhere without behavioral disturbance: Secondary | ICD-10-CM | POA: Diagnosis not present

## 2023-05-23 DIAGNOSIS — E118 Type 2 diabetes mellitus with unspecified complications: Secondary | ICD-10-CM | POA: Diagnosis not present

## 2023-05-23 DIAGNOSIS — I10 Essential (primary) hypertension: Secondary | ICD-10-CM | POA: Diagnosis not present

## 2023-05-23 DIAGNOSIS — G20C Parkinsonism, unspecified: Secondary | ICD-10-CM | POA: Diagnosis not present

## 2023-05-23 DIAGNOSIS — E785 Hyperlipidemia, unspecified: Secondary | ICD-10-CM | POA: Diagnosis not present

## 2023-05-23 DIAGNOSIS — G903 Multi-system degeneration of the autonomic nervous system: Secondary | ICD-10-CM | POA: Diagnosis not present

## 2023-05-24 DIAGNOSIS — E118 Type 2 diabetes mellitus with unspecified complications: Secondary | ICD-10-CM | POA: Diagnosis not present

## 2023-05-24 DIAGNOSIS — F0280 Dementia in other diseases classified elsewhere without behavioral disturbance: Secondary | ICD-10-CM | POA: Diagnosis not present

## 2023-05-24 DIAGNOSIS — E785 Hyperlipidemia, unspecified: Secondary | ICD-10-CM | POA: Diagnosis not present

## 2023-05-24 DIAGNOSIS — G20C Parkinsonism, unspecified: Secondary | ICD-10-CM | POA: Diagnosis not present

## 2023-05-24 DIAGNOSIS — I10 Essential (primary) hypertension: Secondary | ICD-10-CM | POA: Diagnosis not present

## 2023-05-24 DIAGNOSIS — G903 Multi-system degeneration of the autonomic nervous system: Secondary | ICD-10-CM | POA: Diagnosis not present

## 2023-05-25 DIAGNOSIS — R059 Cough, unspecified: Secondary | ICD-10-CM | POA: Diagnosis not present

## 2023-05-25 DIAGNOSIS — J189 Pneumonia, unspecified organism: Secondary | ICD-10-CM | POA: Diagnosis not present

## 2023-05-25 DIAGNOSIS — R509 Fever, unspecified: Secondary | ICD-10-CM | POA: Diagnosis not present

## 2023-05-25 DIAGNOSIS — I10 Essential (primary) hypertension: Secondary | ICD-10-CM | POA: Diagnosis not present

## 2023-05-25 DIAGNOSIS — G20C Parkinsonism, unspecified: Secondary | ICD-10-CM | POA: Diagnosis not present

## 2023-05-25 DIAGNOSIS — E118 Type 2 diabetes mellitus with unspecified complications: Secondary | ICD-10-CM | POA: Diagnosis not present

## 2023-05-25 DIAGNOSIS — E785 Hyperlipidemia, unspecified: Secondary | ICD-10-CM | POA: Diagnosis not present

## 2023-05-25 DIAGNOSIS — R0902 Hypoxemia: Secondary | ICD-10-CM | POA: Diagnosis not present

## 2023-05-25 DIAGNOSIS — R0602 Shortness of breath: Secondary | ICD-10-CM | POA: Diagnosis not present

## 2023-05-25 DIAGNOSIS — R0689 Other abnormalities of breathing: Secondary | ICD-10-CM | POA: Diagnosis not present

## 2023-05-25 DIAGNOSIS — G903 Multi-system degeneration of the autonomic nervous system: Secondary | ICD-10-CM | POA: Diagnosis not present

## 2023-05-25 DIAGNOSIS — F0280 Dementia in other diseases classified elsewhere without behavioral disturbance: Secondary | ICD-10-CM | POA: Diagnosis not present

## 2023-05-25 DIAGNOSIS — R918 Other nonspecific abnormal finding of lung field: Secondary | ICD-10-CM | POA: Diagnosis not present

## 2023-05-25 DIAGNOSIS — J9601 Acute respiratory failure with hypoxia: Secondary | ICD-10-CM | POA: Diagnosis not present

## 2023-05-25 DIAGNOSIS — Z87891 Personal history of nicotine dependence: Secondary | ICD-10-CM | POA: Diagnosis not present

## 2023-05-26 DIAGNOSIS — E782 Mixed hyperlipidemia: Secondary | ICD-10-CM | POA: Diagnosis not present

## 2023-05-26 DIAGNOSIS — Z85118 Personal history of other malignant neoplasm of bronchus and lung: Secondary | ICD-10-CM | POA: Diagnosis not present

## 2023-05-26 DIAGNOSIS — E785 Hyperlipidemia, unspecified: Secondary | ICD-10-CM | POA: Diagnosis not present

## 2023-05-26 DIAGNOSIS — G20C Parkinsonism, unspecified: Secondary | ICD-10-CM | POA: Diagnosis not present

## 2023-05-26 DIAGNOSIS — N183 Chronic kidney disease, stage 3 unspecified: Secondary | ICD-10-CM | POA: Diagnosis not present

## 2023-05-26 DIAGNOSIS — R918 Other nonspecific abnormal finding of lung field: Secondary | ICD-10-CM | POA: Diagnosis not present

## 2023-05-26 DIAGNOSIS — G4733 Obstructive sleep apnea (adult) (pediatric): Secondary | ICD-10-CM | POA: Diagnosis not present

## 2023-05-26 DIAGNOSIS — N390 Urinary tract infection, site not specified: Secondary | ICD-10-CM | POA: Diagnosis not present

## 2023-05-26 DIAGNOSIS — Z9989 Dependence on other enabling machines and devices: Secondary | ICD-10-CM | POA: Diagnosis not present

## 2023-05-26 DIAGNOSIS — I10 Essential (primary) hypertension: Secondary | ICD-10-CM | POA: Diagnosis not present

## 2023-05-26 DIAGNOSIS — J9621 Acute and chronic respiratory failure with hypoxia: Secondary | ICD-10-CM | POA: Diagnosis not present

## 2023-05-26 DIAGNOSIS — F0280 Dementia in other diseases classified elsewhere without behavioral disturbance: Secondary | ICD-10-CM | POA: Diagnosis not present

## 2023-05-26 DIAGNOSIS — N1831 Chronic kidney disease, stage 3a: Secondary | ICD-10-CM | POA: Diagnosis not present

## 2023-05-26 DIAGNOSIS — I2510 Atherosclerotic heart disease of native coronary artery without angina pectoris: Secondary | ICD-10-CM | POA: Diagnosis not present

## 2023-05-26 DIAGNOSIS — J189 Pneumonia, unspecified organism: Secondary | ICD-10-CM | POA: Diagnosis not present

## 2023-05-26 DIAGNOSIS — J9601 Acute respiratory failure with hypoxia: Secondary | ICD-10-CM | POA: Diagnosis not present

## 2023-05-26 DIAGNOSIS — C3491 Malignant neoplasm of unspecified part of right bronchus or lung: Secondary | ICD-10-CM | POA: Diagnosis not present

## 2023-05-26 DIAGNOSIS — E118 Type 2 diabetes mellitus with unspecified complications: Secondary | ICD-10-CM | POA: Diagnosis not present

## 2023-05-26 DIAGNOSIS — E119 Type 2 diabetes mellitus without complications: Secondary | ICD-10-CM | POA: Diagnosis not present

## 2023-05-26 DIAGNOSIS — J4540 Moderate persistent asthma, uncomplicated: Secondary | ICD-10-CM | POA: Diagnosis not present

## 2023-05-26 DIAGNOSIS — G903 Multi-system degeneration of the autonomic nervous system: Secondary | ICD-10-CM | POA: Diagnosis not present

## 2023-05-27 DIAGNOSIS — J90 Pleural effusion, not elsewhere classified: Secondary | ICD-10-CM | POA: Diagnosis not present

## 2023-05-27 DIAGNOSIS — J9622 Acute and chronic respiratory failure with hypercapnia: Secondary | ICD-10-CM | POA: Diagnosis not present

## 2023-05-27 DIAGNOSIS — G20C Parkinsonism, unspecified: Secondary | ICD-10-CM | POA: Diagnosis not present

## 2023-05-27 DIAGNOSIS — J9621 Acute and chronic respiratory failure with hypoxia: Secondary | ICD-10-CM | POA: Diagnosis not present

## 2023-05-27 DIAGNOSIS — G4733 Obstructive sleep apnea (adult) (pediatric): Secondary | ICD-10-CM | POA: Diagnosis not present

## 2023-05-27 DIAGNOSIS — R918 Other nonspecific abnormal finding of lung field: Secondary | ICD-10-CM | POA: Diagnosis not present

## 2023-05-27 DIAGNOSIS — C3491 Malignant neoplasm of unspecified part of right bronchus or lung: Secondary | ICD-10-CM | POA: Diagnosis not present

## 2023-05-27 DIAGNOSIS — J189 Pneumonia, unspecified organism: Secondary | ICD-10-CM | POA: Diagnosis not present

## 2023-05-27 DIAGNOSIS — J984 Other disorders of lung: Secondary | ICD-10-CM | POA: Diagnosis not present

## 2023-05-28 DIAGNOSIS — E118 Type 2 diabetes mellitus with unspecified complications: Secondary | ICD-10-CM | POA: Diagnosis not present

## 2023-05-28 DIAGNOSIS — E785 Hyperlipidemia, unspecified: Secondary | ICD-10-CM | POA: Diagnosis not present

## 2023-05-28 DIAGNOSIS — I10 Essential (primary) hypertension: Secondary | ICD-10-CM | POA: Diagnosis not present

## 2023-05-28 DIAGNOSIS — J189 Pneumonia, unspecified organism: Secondary | ICD-10-CM | POA: Diagnosis not present

## 2023-05-28 DIAGNOSIS — J9621 Acute and chronic respiratory failure with hypoxia: Secondary | ICD-10-CM | POA: Diagnosis not present

## 2023-05-28 DIAGNOSIS — F0280 Dementia in other diseases classified elsewhere without behavioral disturbance: Secondary | ICD-10-CM | POA: Diagnosis not present

## 2023-05-28 DIAGNOSIS — G20C Parkinsonism, unspecified: Secondary | ICD-10-CM | POA: Diagnosis not present

## 2023-05-28 DIAGNOSIS — G4733 Obstructive sleep apnea (adult) (pediatric): Secondary | ICD-10-CM | POA: Diagnosis not present

## 2023-05-28 DIAGNOSIS — J9622 Acute and chronic respiratory failure with hypercapnia: Secondary | ICD-10-CM | POA: Diagnosis not present

## 2023-05-28 DIAGNOSIS — G903 Multi-system degeneration of the autonomic nervous system: Secondary | ICD-10-CM | POA: Diagnosis not present

## 2023-05-28 DIAGNOSIS — C3491 Malignant neoplasm of unspecified part of right bronchus or lung: Secondary | ICD-10-CM | POA: Diagnosis not present

## 2023-05-29 DIAGNOSIS — J9621 Acute and chronic respiratory failure with hypoxia: Secondary | ICD-10-CM | POA: Diagnosis not present

## 2023-05-29 DIAGNOSIS — J189 Pneumonia, unspecified organism: Secondary | ICD-10-CM | POA: Diagnosis not present

## 2023-05-29 DIAGNOSIS — E785 Hyperlipidemia, unspecified: Secondary | ICD-10-CM | POA: Diagnosis not present

## 2023-05-29 DIAGNOSIS — C3491 Malignant neoplasm of unspecified part of right bronchus or lung: Secondary | ICD-10-CM | POA: Diagnosis not present

## 2023-05-29 DIAGNOSIS — G903 Multi-system degeneration of the autonomic nervous system: Secondary | ICD-10-CM | POA: Diagnosis not present

## 2023-05-29 DIAGNOSIS — E118 Type 2 diabetes mellitus with unspecified complications: Secondary | ICD-10-CM | POA: Diagnosis not present

## 2023-05-29 DIAGNOSIS — F0280 Dementia in other diseases classified elsewhere without behavioral disturbance: Secondary | ICD-10-CM | POA: Diagnosis not present

## 2023-05-29 DIAGNOSIS — G4733 Obstructive sleep apnea (adult) (pediatric): Secondary | ICD-10-CM | POA: Diagnosis not present

## 2023-05-29 DIAGNOSIS — J9622 Acute and chronic respiratory failure with hypercapnia: Secondary | ICD-10-CM | POA: Diagnosis not present

## 2023-05-29 DIAGNOSIS — I10 Essential (primary) hypertension: Secondary | ICD-10-CM | POA: Diagnosis not present

## 2023-05-29 DIAGNOSIS — G20C Parkinsonism, unspecified: Secondary | ICD-10-CM | POA: Diagnosis not present

## 2023-05-30 DIAGNOSIS — J189 Pneumonia, unspecified organism: Secondary | ICD-10-CM | POA: Diagnosis not present

## 2023-05-30 DIAGNOSIS — G4733 Obstructive sleep apnea (adult) (pediatric): Secondary | ICD-10-CM | POA: Diagnosis not present

## 2023-05-30 DIAGNOSIS — J9621 Acute and chronic respiratory failure with hypoxia: Secondary | ICD-10-CM | POA: Diagnosis not present

## 2023-05-30 DIAGNOSIS — C3491 Malignant neoplasm of unspecified part of right bronchus or lung: Secondary | ICD-10-CM | POA: Diagnosis not present

## 2023-05-30 DIAGNOSIS — J9601 Acute respiratory failure with hypoxia: Secondary | ICD-10-CM | POA: Diagnosis not present

## 2023-05-30 DIAGNOSIS — J9622 Acute and chronic respiratory failure with hypercapnia: Secondary | ICD-10-CM | POA: Diagnosis not present

## 2023-05-30 DIAGNOSIS — G20C Parkinsonism, unspecified: Secondary | ICD-10-CM | POA: Diagnosis not present

## 2023-05-31 DIAGNOSIS — J9621 Acute and chronic respiratory failure with hypoxia: Secondary | ICD-10-CM | POA: Diagnosis not present

## 2023-05-31 DIAGNOSIS — J189 Pneumonia, unspecified organism: Secondary | ICD-10-CM | POA: Diagnosis not present

## 2023-05-31 DIAGNOSIS — G20C Parkinsonism, unspecified: Secondary | ICD-10-CM | POA: Diagnosis not present

## 2023-05-31 DIAGNOSIS — J9622 Acute and chronic respiratory failure with hypercapnia: Secondary | ICD-10-CM | POA: Diagnosis not present

## 2023-05-31 DIAGNOSIS — C3491 Malignant neoplasm of unspecified part of right bronchus or lung: Secondary | ICD-10-CM | POA: Diagnosis not present

## 2023-05-31 DIAGNOSIS — G4733 Obstructive sleep apnea (adult) (pediatric): Secondary | ICD-10-CM | POA: Diagnosis not present

## 2023-05-31 DIAGNOSIS — J90 Pleural effusion, not elsewhere classified: Secondary | ICD-10-CM | POA: Diagnosis not present

## 2023-06-01 DIAGNOSIS — J90 Pleural effusion, not elsewhere classified: Secondary | ICD-10-CM | POA: Diagnosis not present

## 2023-06-01 DIAGNOSIS — R918 Other nonspecific abnormal finding of lung field: Secondary | ICD-10-CM | POA: Diagnosis not present

## 2023-06-01 DIAGNOSIS — C3491 Malignant neoplasm of unspecified part of right bronchus or lung: Secondary | ICD-10-CM | POA: Diagnosis not present

## 2023-06-01 DIAGNOSIS — J9811 Atelectasis: Secondary | ICD-10-CM | POA: Diagnosis not present

## 2023-06-01 DIAGNOSIS — G20C Parkinsonism, unspecified: Secondary | ICD-10-CM | POA: Diagnosis not present

## 2023-06-01 DIAGNOSIS — J9621 Acute and chronic respiratory failure with hypoxia: Secondary | ICD-10-CM | POA: Diagnosis not present

## 2023-06-01 DIAGNOSIS — J189 Pneumonia, unspecified organism: Secondary | ICD-10-CM | POA: Diagnosis not present

## 2023-06-01 DIAGNOSIS — G4733 Obstructive sleep apnea (adult) (pediatric): Secondary | ICD-10-CM | POA: Diagnosis not present

## 2023-06-01 DIAGNOSIS — J9622 Acute and chronic respiratory failure with hypercapnia: Secondary | ICD-10-CM | POA: Diagnosis not present

## 2023-06-02 DIAGNOSIS — C3491 Malignant neoplasm of unspecified part of right bronchus or lung: Secondary | ICD-10-CM | POA: Diagnosis not present

## 2023-06-02 DIAGNOSIS — J9621 Acute and chronic respiratory failure with hypoxia: Secondary | ICD-10-CM | POA: Diagnosis not present

## 2023-06-02 DIAGNOSIS — G20C Parkinsonism, unspecified: Secondary | ICD-10-CM | POA: Diagnosis not present

## 2023-06-02 DIAGNOSIS — G4733 Obstructive sleep apnea (adult) (pediatric): Secondary | ICD-10-CM | POA: Diagnosis not present

## 2023-06-02 DIAGNOSIS — J189 Pneumonia, unspecified organism: Secondary | ICD-10-CM | POA: Diagnosis not present

## 2023-06-02 DIAGNOSIS — J9622 Acute and chronic respiratory failure with hypercapnia: Secondary | ICD-10-CM | POA: Diagnosis not present

## 2023-06-03 DIAGNOSIS — J189 Pneumonia, unspecified organism: Secondary | ICD-10-CM | POA: Diagnosis not present

## 2023-06-03 DIAGNOSIS — G20C Parkinsonism, unspecified: Secondary | ICD-10-CM | POA: Diagnosis not present

## 2023-06-03 DIAGNOSIS — J9621 Acute and chronic respiratory failure with hypoxia: Secondary | ICD-10-CM | POA: Diagnosis not present

## 2023-06-03 DIAGNOSIS — C3491 Malignant neoplasm of unspecified part of right bronchus or lung: Secondary | ICD-10-CM | POA: Diagnosis not present

## 2023-06-03 DIAGNOSIS — J90 Pleural effusion, not elsewhere classified: Secondary | ICD-10-CM | POA: Diagnosis not present

## 2023-06-03 DIAGNOSIS — J9622 Acute and chronic respiratory failure with hypercapnia: Secondary | ICD-10-CM | POA: Diagnosis not present

## 2023-06-03 DIAGNOSIS — G4733 Obstructive sleep apnea (adult) (pediatric): Secondary | ICD-10-CM | POA: Diagnosis not present

## 2023-06-04 DIAGNOSIS — Z8546 Personal history of malignant neoplasm of prostate: Secondary | ICD-10-CM | POA: Diagnosis not present

## 2023-06-04 DIAGNOSIS — R918 Other nonspecific abnormal finding of lung field: Secondary | ICD-10-CM | POA: Diagnosis not present

## 2023-06-04 DIAGNOSIS — G4733 Obstructive sleep apnea (adult) (pediatric): Secondary | ICD-10-CM | POA: Diagnosis not present

## 2023-06-04 DIAGNOSIS — J9622 Acute and chronic respiratory failure with hypercapnia: Secondary | ICD-10-CM | POA: Diagnosis not present

## 2023-06-04 DIAGNOSIS — J9621 Acute and chronic respiratory failure with hypoxia: Secondary | ICD-10-CM | POA: Diagnosis not present

## 2023-06-04 DIAGNOSIS — R0603 Acute respiratory distress: Secondary | ICD-10-CM | POA: Diagnosis not present

## 2023-06-04 DIAGNOSIS — N183 Chronic kidney disease, stage 3 unspecified: Secondary | ICD-10-CM | POA: Diagnosis not present

## 2023-06-04 DIAGNOSIS — G20C Parkinsonism, unspecified: Secondary | ICD-10-CM | POA: Diagnosis not present

## 2023-06-04 DIAGNOSIS — J9809 Other diseases of bronchus, not elsewhere classified: Secondary | ICD-10-CM | POA: Diagnosis not present

## 2023-06-04 DIAGNOSIS — J9601 Acute respiratory failure with hypoxia: Secondary | ICD-10-CM | POA: Diagnosis not present

## 2023-06-04 DIAGNOSIS — J189 Pneumonia, unspecified organism: Secondary | ICD-10-CM | POA: Diagnosis not present

## 2023-06-04 DIAGNOSIS — T17890A Other foreign object in other parts of respiratory tract causing asphyxiation, initial encounter: Secondary | ICD-10-CM | POA: Diagnosis not present

## 2023-06-04 DIAGNOSIS — I129 Hypertensive chronic kidney disease with stage 1 through stage 4 chronic kidney disease, or unspecified chronic kidney disease: Secondary | ICD-10-CM | POA: Diagnosis not present

## 2023-06-04 DIAGNOSIS — C3491 Malignant neoplasm of unspecified part of right bronchus or lung: Secondary | ICD-10-CM | POA: Diagnosis not present

## 2023-06-04 DIAGNOSIS — N1831 Chronic kidney disease, stage 3a: Secondary | ICD-10-CM | POA: Diagnosis not present

## 2023-06-04 DIAGNOSIS — E1122 Type 2 diabetes mellitus with diabetic chronic kidney disease: Secondary | ICD-10-CM | POA: Diagnosis not present

## 2023-06-04 DIAGNOSIS — R846 Abnormal cytological findings in specimens from respiratory organs and thorax: Secondary | ICD-10-CM | POA: Diagnosis not present

## 2023-06-04 DIAGNOSIS — E669 Obesity, unspecified: Secondary | ICD-10-CM | POA: Diagnosis not present

## 2023-06-04 DIAGNOSIS — J90 Pleural effusion, not elsewhere classified: Secondary | ICD-10-CM | POA: Diagnosis not present

## 2023-06-05 DIAGNOSIS — J4540 Moderate persistent asthma, uncomplicated: Secondary | ICD-10-CM | POA: Diagnosis not present

## 2023-06-05 DIAGNOSIS — I959 Hypotension, unspecified: Secondary | ICD-10-CM | POA: Diagnosis not present

## 2023-06-05 DIAGNOSIS — J189 Pneumonia, unspecified organism: Secondary | ICD-10-CM | POA: Diagnosis not present

## 2023-06-05 DIAGNOSIS — G4733 Obstructive sleep apnea (adult) (pediatric): Secondary | ICD-10-CM | POA: Diagnosis not present

## 2023-06-05 DIAGNOSIS — J9601 Acute respiratory failure with hypoxia: Secondary | ICD-10-CM | POA: Diagnosis not present

## 2023-06-05 DIAGNOSIS — R846 Abnormal cytological findings in specimens from respiratory organs and thorax: Secondary | ICD-10-CM | POA: Diagnosis not present

## 2023-06-05 DIAGNOSIS — N1831 Chronic kidney disease, stage 3a: Secondary | ICD-10-CM | POA: Diagnosis not present

## 2023-06-05 DIAGNOSIS — J9621 Acute and chronic respiratory failure with hypoxia: Secondary | ICD-10-CM | POA: Diagnosis not present

## 2023-06-05 DIAGNOSIS — C3491 Malignant neoplasm of unspecified part of right bronchus or lung: Secondary | ICD-10-CM | POA: Diagnosis not present

## 2023-06-05 DIAGNOSIS — G20C Parkinsonism, unspecified: Secondary | ICD-10-CM | POA: Diagnosis not present

## 2023-06-05 DIAGNOSIS — R1310 Dysphagia, unspecified: Secondary | ICD-10-CM | POA: Diagnosis not present

## 2023-06-05 DIAGNOSIS — Z8546 Personal history of malignant neoplasm of prostate: Secondary | ICD-10-CM | POA: Diagnosis not present

## 2023-06-05 DIAGNOSIS — J986 Disorders of diaphragm: Secondary | ICD-10-CM | POA: Diagnosis not present

## 2023-06-05 DIAGNOSIS — J9811 Atelectasis: Secondary | ICD-10-CM | POA: Diagnosis not present

## 2023-06-05 DIAGNOSIS — R0603 Acute respiratory distress: Secondary | ICD-10-CM | POA: Diagnosis not present

## 2023-06-06 DIAGNOSIS — G20C Parkinsonism, unspecified: Secondary | ICD-10-CM | POA: Diagnosis not present

## 2023-06-06 DIAGNOSIS — R1310 Dysphagia, unspecified: Secondary | ICD-10-CM | POA: Diagnosis not present

## 2023-06-06 DIAGNOSIS — J9601 Acute respiratory failure with hypoxia: Secondary | ICD-10-CM | POA: Diagnosis not present

## 2023-06-06 DIAGNOSIS — I959 Hypotension, unspecified: Secondary | ICD-10-CM | POA: Diagnosis not present

## 2023-06-06 DIAGNOSIS — G4733 Obstructive sleep apnea (adult) (pediatric): Secondary | ICD-10-CM | POA: Diagnosis not present

## 2023-06-06 DIAGNOSIS — J4540 Moderate persistent asthma, uncomplicated: Secondary | ICD-10-CM | POA: Diagnosis not present

## 2023-06-06 DIAGNOSIS — J9621 Acute and chronic respiratory failure with hypoxia: Secondary | ICD-10-CM | POA: Diagnosis not present

## 2023-06-07 DIAGNOSIS — J9621 Acute and chronic respiratory failure with hypoxia: Secondary | ICD-10-CM | POA: Diagnosis not present

## 2023-06-07 DIAGNOSIS — I959 Hypotension, unspecified: Secondary | ICD-10-CM | POA: Diagnosis not present

## 2023-06-07 DIAGNOSIS — J9601 Acute respiratory failure with hypoxia: Secondary | ICD-10-CM | POA: Diagnosis not present

## 2023-06-07 DIAGNOSIS — R1310 Dysphagia, unspecified: Secondary | ICD-10-CM | POA: Diagnosis not present

## 2023-06-07 DIAGNOSIS — G20C Parkinsonism, unspecified: Secondary | ICD-10-CM | POA: Diagnosis not present

## 2023-06-07 DIAGNOSIS — J4540 Moderate persistent asthma, uncomplicated: Secondary | ICD-10-CM | POA: Diagnosis not present

## 2023-06-07 DIAGNOSIS — G4733 Obstructive sleep apnea (adult) (pediatric): Secondary | ICD-10-CM | POA: Diagnosis not present

## 2023-06-08 DIAGNOSIS — J9621 Acute and chronic respiratory failure with hypoxia: Secondary | ICD-10-CM | POA: Diagnosis not present

## 2023-06-09 DIAGNOSIS — J9621 Acute and chronic respiratory failure with hypoxia: Secondary | ICD-10-CM | POA: Diagnosis not present

## 2023-06-10 DIAGNOSIS — J9621 Acute and chronic respiratory failure with hypoxia: Secondary | ICD-10-CM | POA: Diagnosis not present

## 2023-06-11 DIAGNOSIS — R0602 Shortness of breath: Secondary | ICD-10-CM | POA: Diagnosis not present

## 2023-06-11 DIAGNOSIS — G20A1 Parkinson's disease without dyskinesia, without mention of fluctuations: Secondary | ICD-10-CM | POA: Diagnosis not present

## 2023-06-11 DIAGNOSIS — N179 Acute kidney failure, unspecified: Secondary | ICD-10-CM | POA: Diagnosis not present

## 2023-06-11 DIAGNOSIS — N1832 Chronic kidney disease, stage 3b: Secondary | ICD-10-CM | POA: Diagnosis not present

## 2023-06-11 DIAGNOSIS — J9621 Acute and chronic respiratory failure with hypoxia: Secondary | ICD-10-CM | POA: Diagnosis not present

## 2023-06-11 DIAGNOSIS — E119 Type 2 diabetes mellitus without complications: Secondary | ICD-10-CM | POA: Diagnosis not present

## 2023-06-12 DIAGNOSIS — G20C Parkinsonism, unspecified: Secondary | ICD-10-CM | POA: Diagnosis not present

## 2023-06-12 DIAGNOSIS — Z85118 Personal history of other malignant neoplasm of bronchus and lung: Secondary | ICD-10-CM | POA: Diagnosis not present

## 2023-06-12 DIAGNOSIS — J189 Pneumonia, unspecified organism: Secondary | ICD-10-CM | POA: Diagnosis not present

## 2023-06-12 DIAGNOSIS — R1310 Dysphagia, unspecified: Secondary | ICD-10-CM | POA: Diagnosis not present

## 2023-06-12 DIAGNOSIS — G238 Other specified degenerative diseases of basal ganglia: Secondary | ICD-10-CM | POA: Diagnosis not present

## 2023-06-12 DIAGNOSIS — J4540 Moderate persistent asthma, uncomplicated: Secondary | ICD-10-CM | POA: Diagnosis not present

## 2023-06-12 DIAGNOSIS — G903 Multi-system degeneration of the autonomic nervous system: Secondary | ICD-10-CM | POA: Diagnosis not present

## 2023-06-12 DIAGNOSIS — J9621 Acute and chronic respiratory failure with hypoxia: Secondary | ICD-10-CM | POA: Diagnosis not present

## 2023-06-12 DIAGNOSIS — C3491 Malignant neoplasm of unspecified part of right bronchus or lung: Secondary | ICD-10-CM | POA: Diagnosis not present

## 2023-06-12 DIAGNOSIS — G4733 Obstructive sleep apnea (adult) (pediatric): Secondary | ICD-10-CM | POA: Diagnosis not present

## 2023-06-12 DIAGNOSIS — J9601 Acute respiratory failure with hypoxia: Secondary | ICD-10-CM | POA: Diagnosis not present

## 2023-06-13 DIAGNOSIS — J4540 Moderate persistent asthma, uncomplicated: Secondary | ICD-10-CM | POA: Diagnosis not present

## 2023-06-13 DIAGNOSIS — J189 Pneumonia, unspecified organism: Secondary | ICD-10-CM | POA: Diagnosis not present

## 2023-06-13 DIAGNOSIS — C3491 Malignant neoplasm of unspecified part of right bronchus or lung: Secondary | ICD-10-CM | POA: Diagnosis not present

## 2023-06-13 DIAGNOSIS — R1310 Dysphagia, unspecified: Secondary | ICD-10-CM | POA: Diagnosis not present

## 2023-06-13 DIAGNOSIS — J9601 Acute respiratory failure with hypoxia: Secondary | ICD-10-CM | POA: Diagnosis not present

## 2023-06-13 DIAGNOSIS — G20C Parkinsonism, unspecified: Secondary | ICD-10-CM | POA: Diagnosis not present

## 2023-06-13 DIAGNOSIS — G238 Other specified degenerative diseases of basal ganglia: Secondary | ICD-10-CM | POA: Diagnosis not present

## 2023-06-13 DIAGNOSIS — G903 Multi-system degeneration of the autonomic nervous system: Secondary | ICD-10-CM | POA: Diagnosis not present

## 2023-06-13 DIAGNOSIS — G4733 Obstructive sleep apnea (adult) (pediatric): Secondary | ICD-10-CM | POA: Diagnosis not present

## 2023-06-13 DIAGNOSIS — Z85118 Personal history of other malignant neoplasm of bronchus and lung: Secondary | ICD-10-CM | POA: Diagnosis not present

## 2023-06-13 DIAGNOSIS — J9621 Acute and chronic respiratory failure with hypoxia: Secondary | ICD-10-CM | POA: Diagnosis not present

## 2023-06-14 DIAGNOSIS — G238 Other specified degenerative diseases of basal ganglia: Secondary | ICD-10-CM | POA: Diagnosis not present

## 2023-06-14 DIAGNOSIS — R918 Other nonspecific abnormal finding of lung field: Secondary | ICD-10-CM | POA: Diagnosis not present

## 2023-06-14 DIAGNOSIS — G20C Parkinsonism, unspecified: Secondary | ICD-10-CM | POA: Diagnosis not present

## 2023-06-14 DIAGNOSIS — J189 Pneumonia, unspecified organism: Secondary | ICD-10-CM | POA: Diagnosis not present

## 2023-06-14 DIAGNOSIS — G903 Multi-system degeneration of the autonomic nervous system: Secondary | ICD-10-CM | POA: Diagnosis not present

## 2023-06-14 DIAGNOSIS — J9621 Acute and chronic respiratory failure with hypoxia: Secondary | ICD-10-CM | POA: Diagnosis not present

## 2023-06-14 DIAGNOSIS — Z66 Do not resuscitate: Secondary | ICD-10-CM | POA: Diagnosis not present

## 2023-06-14 DIAGNOSIS — R1310 Dysphagia, unspecified: Secondary | ICD-10-CM | POA: Diagnosis not present

## 2023-06-14 DIAGNOSIS — C3491 Malignant neoplasm of unspecified part of right bronchus or lung: Secondary | ICD-10-CM | POA: Diagnosis not present

## 2023-06-14 DIAGNOSIS — Z85118 Personal history of other malignant neoplasm of bronchus and lung: Secondary | ICD-10-CM | POA: Diagnosis not present

## 2023-06-14 DIAGNOSIS — R0600 Dyspnea, unspecified: Secondary | ICD-10-CM | POA: Diagnosis not present

## 2023-06-14 DIAGNOSIS — R059 Cough, unspecified: Secondary | ICD-10-CM | POA: Diagnosis not present

## 2023-06-14 DIAGNOSIS — J4540 Moderate persistent asthma, uncomplicated: Secondary | ICD-10-CM | POA: Diagnosis not present

## 2023-06-14 DIAGNOSIS — Z7189 Other specified counseling: Secondary | ICD-10-CM | POA: Diagnosis not present

## 2023-06-14 DIAGNOSIS — J9601 Acute respiratory failure with hypoxia: Secondary | ICD-10-CM | POA: Diagnosis not present

## 2023-06-14 DIAGNOSIS — G4733 Obstructive sleep apnea (adult) (pediatric): Secondary | ICD-10-CM | POA: Diagnosis not present

## 2023-06-14 DIAGNOSIS — R0602 Shortness of breath: Secondary | ICD-10-CM | POA: Diagnosis not present

## 2023-06-15 DIAGNOSIS — J4540 Moderate persistent asthma, uncomplicated: Secondary | ICD-10-CM | POA: Diagnosis not present

## 2023-06-15 DIAGNOSIS — R0600 Dyspnea, unspecified: Secondary | ICD-10-CM | POA: Diagnosis not present

## 2023-06-15 DIAGNOSIS — Z515 Encounter for palliative care: Secondary | ICD-10-CM | POA: Diagnosis not present

## 2023-06-15 DIAGNOSIS — J9621 Acute and chronic respiratory failure with hypoxia: Secondary | ICD-10-CM | POA: Diagnosis not present

## 2023-06-15 DIAGNOSIS — Z85118 Personal history of other malignant neoplasm of bronchus and lung: Secondary | ICD-10-CM | POA: Diagnosis not present

## 2023-06-15 DIAGNOSIS — R1310 Dysphagia, unspecified: Secondary | ICD-10-CM | POA: Diagnosis not present

## 2023-06-15 DIAGNOSIS — Z66 Do not resuscitate: Secondary | ICD-10-CM | POA: Diagnosis not present

## 2023-06-16 DIAGNOSIS — J4540 Moderate persistent asthma, uncomplicated: Secondary | ICD-10-CM | POA: Diagnosis not present

## 2023-06-16 DIAGNOSIS — R1310 Dysphagia, unspecified: Secondary | ICD-10-CM | POA: Diagnosis not present

## 2023-06-16 DIAGNOSIS — Z85118 Personal history of other malignant neoplasm of bronchus and lung: Secondary | ICD-10-CM | POA: Diagnosis not present

## 2023-06-16 DIAGNOSIS — J9621 Acute and chronic respiratory failure with hypoxia: Secondary | ICD-10-CM | POA: Diagnosis not present

## 2023-06-17 DIAGNOSIS — J9621 Acute and chronic respiratory failure with hypoxia: Secondary | ICD-10-CM | POA: Diagnosis not present

## 2023-06-17 DIAGNOSIS — J4540 Moderate persistent asthma, uncomplicated: Secondary | ICD-10-CM | POA: Diagnosis not present

## 2023-06-17 DIAGNOSIS — R918 Other nonspecific abnormal finding of lung field: Secondary | ICD-10-CM | POA: Diagnosis not present

## 2023-06-17 DIAGNOSIS — R059 Cough, unspecified: Secondary | ICD-10-CM | POA: Diagnosis not present

## 2023-06-17 DIAGNOSIS — R1310 Dysphagia, unspecified: Secondary | ICD-10-CM | POA: Diagnosis not present

## 2023-06-17 DIAGNOSIS — Z85118 Personal history of other malignant neoplasm of bronchus and lung: Secondary | ICD-10-CM | POA: Diagnosis not present

## 2023-06-18 DIAGNOSIS — J4540 Moderate persistent asthma, uncomplicated: Secondary | ICD-10-CM | POA: Diagnosis not present

## 2023-06-18 DIAGNOSIS — J9621 Acute and chronic respiratory failure with hypoxia: Secondary | ICD-10-CM | POA: Diagnosis not present

## 2023-06-18 DIAGNOSIS — R1310 Dysphagia, unspecified: Secondary | ICD-10-CM | POA: Diagnosis not present

## 2023-06-18 DIAGNOSIS — Z85118 Personal history of other malignant neoplasm of bronchus and lung: Secondary | ICD-10-CM | POA: Diagnosis not present

## 2023-06-18 DIAGNOSIS — Z515 Encounter for palliative care: Secondary | ICD-10-CM | POA: Diagnosis not present

## 2023-06-19 DIAGNOSIS — R339 Retention of urine, unspecified: Secondary | ICD-10-CM | POA: Diagnosis not present

## 2023-06-19 DIAGNOSIS — N183 Chronic kidney disease, stage 3 unspecified: Secondary | ICD-10-CM | POA: Diagnosis not present

## 2023-06-19 DIAGNOSIS — N401 Enlarged prostate with lower urinary tract symptoms: Secondary | ICD-10-CM | POA: Diagnosis not present

## 2023-06-19 DIAGNOSIS — E785 Hyperlipidemia, unspecified: Secondary | ICD-10-CM | POA: Diagnosis not present

## 2023-06-19 DIAGNOSIS — Z515 Encounter for palliative care: Secondary | ICD-10-CM | POA: Diagnosis not present

## 2023-06-19 DIAGNOSIS — I10 Essential (primary) hypertension: Secondary | ICD-10-CM | POA: Diagnosis not present

## 2023-06-19 DIAGNOSIS — G4733 Obstructive sleep apnea (adult) (pediatric): Secondary | ICD-10-CM | POA: Diagnosis not present

## 2023-06-19 DIAGNOSIS — Z85118 Personal history of other malignant neoplasm of bronchus and lung: Secondary | ICD-10-CM | POA: Diagnosis not present

## 2023-06-19 DIAGNOSIS — Z902 Acquired absence of lung [part of]: Secondary | ICD-10-CM | POA: Diagnosis not present

## 2023-06-19 DIAGNOSIS — C61 Malignant neoplasm of prostate: Secondary | ICD-10-CM | POA: Diagnosis not present

## 2023-06-19 DIAGNOSIS — F0390 Unspecified dementia without behavioral disturbance: Secondary | ICD-10-CM | POA: Diagnosis not present

## 2023-06-19 DIAGNOSIS — G609 Hereditary and idiopathic neuropathy, unspecified: Secondary | ICD-10-CM | POA: Diagnosis not present

## 2023-06-19 DIAGNOSIS — J9621 Acute and chronic respiratory failure with hypoxia: Secondary | ICD-10-CM | POA: Diagnosis not present

## 2023-06-19 DIAGNOSIS — Z66 Do not resuscitate: Secondary | ICD-10-CM | POA: Diagnosis not present

## 2023-06-19 DIAGNOSIS — R1310 Dysphagia, unspecified: Secondary | ICD-10-CM | POA: Diagnosis not present

## 2023-06-19 DIAGNOSIS — J9601 Acute respiratory failure with hypoxia: Secondary | ICD-10-CM | POA: Diagnosis not present

## 2023-06-19 DIAGNOSIS — J4540 Moderate persistent asthma, uncomplicated: Secondary | ICD-10-CM | POA: Diagnosis not present

## 2023-06-19 DIAGNOSIS — E118 Type 2 diabetes mellitus with unspecified complications: Secondary | ICD-10-CM | POA: Diagnosis not present

## 2023-06-19 DIAGNOSIS — K219 Gastro-esophageal reflux disease without esophagitis: Secondary | ICD-10-CM | POA: Diagnosis not present

## 2023-06-19 DIAGNOSIS — H409 Unspecified glaucoma: Secondary | ICD-10-CM | POA: Diagnosis not present

## 2023-06-19 DIAGNOSIS — J45998 Other asthma: Secondary | ICD-10-CM | POA: Diagnosis not present

## 2023-06-19 DIAGNOSIS — G20A1 Parkinson's disease without dyskinesia, without mention of fluctuations: Secondary | ICD-10-CM | POA: Diagnosis not present

## 2023-06-19 DIAGNOSIS — F32A Depression, unspecified: Secondary | ICD-10-CM | POA: Diagnosis not present

## 2023-06-20 DIAGNOSIS — J189 Pneumonia, unspecified organism: Secondary | ICD-10-CM | POA: Diagnosis not present

## 2023-06-20 DIAGNOSIS — Z7189 Other specified counseling: Secondary | ICD-10-CM | POA: Diagnosis not present

## 2023-06-20 DIAGNOSIS — R1310 Dysphagia, unspecified: Secondary | ICD-10-CM | POA: Diagnosis not present

## 2023-06-20 DIAGNOSIS — R0600 Dyspnea, unspecified: Secondary | ICD-10-CM | POA: Diagnosis not present

## 2023-06-20 DIAGNOSIS — G4733 Obstructive sleep apnea (adult) (pediatric): Secondary | ICD-10-CM | POA: Diagnosis not present

## 2023-06-20 DIAGNOSIS — J45998 Other asthma: Secondary | ICD-10-CM | POA: Diagnosis not present

## 2023-06-20 DIAGNOSIS — E119 Type 2 diabetes mellitus without complications: Secondary | ICD-10-CM | POA: Diagnosis not present

## 2023-06-20 DIAGNOSIS — F419 Anxiety disorder, unspecified: Secondary | ICD-10-CM | POA: Diagnosis not present

## 2023-06-20 DIAGNOSIS — J9621 Acute and chronic respiratory failure with hypoxia: Secondary | ICD-10-CM | POA: Diagnosis not present

## 2023-06-20 DIAGNOSIS — E785 Hyperlipidemia, unspecified: Secondary | ICD-10-CM | POA: Diagnosis not present

## 2023-06-20 DIAGNOSIS — Z515 Encounter for palliative care: Secondary | ICD-10-CM | POA: Diagnosis not present

## 2023-06-20 DIAGNOSIS — N183 Chronic kidney disease, stage 3 unspecified: Secondary | ICD-10-CM | POA: Diagnosis not present

## 2023-06-20 DIAGNOSIS — J9601 Acute respiratory failure with hypoxia: Secondary | ICD-10-CM | POA: Diagnosis not present

## 2023-06-20 DIAGNOSIS — G20A1 Parkinson's disease without dyskinesia, without mention of fluctuations: Secondary | ICD-10-CM | POA: Diagnosis not present

## 2023-06-20 DIAGNOSIS — I10 Essential (primary) hypertension: Secondary | ICD-10-CM | POA: Diagnosis not present

## 2023-06-20 DIAGNOSIS — R52 Pain, unspecified: Secondary | ICD-10-CM | POA: Diagnosis not present

## 2023-06-21 DIAGNOSIS — J9601 Acute respiratory failure with hypoxia: Secondary | ICD-10-CM | POA: Diagnosis not present

## 2023-06-21 DIAGNOSIS — R52 Pain, unspecified: Secondary | ICD-10-CM | POA: Diagnosis not present

## 2023-06-21 DIAGNOSIS — F419 Anxiety disorder, unspecified: Secondary | ICD-10-CM | POA: Diagnosis not present

## 2023-06-21 DIAGNOSIS — R0600 Dyspnea, unspecified: Secondary | ICD-10-CM | POA: Diagnosis not present

## 2023-06-21 DIAGNOSIS — J45998 Other asthma: Secondary | ICD-10-CM | POA: Diagnosis not present

## 2023-06-21 DIAGNOSIS — N183 Chronic kidney disease, stage 3 unspecified: Secondary | ICD-10-CM | POA: Diagnosis not present

## 2023-06-21 DIAGNOSIS — I10 Essential (primary) hypertension: Secondary | ICD-10-CM | POA: Diagnosis not present

## 2023-06-21 DIAGNOSIS — G20A1 Parkinson's disease without dyskinesia, without mention of fluctuations: Secondary | ICD-10-CM | POA: Diagnosis not present

## 2023-06-21 DIAGNOSIS — Z7189 Other specified counseling: Secondary | ICD-10-CM | POA: Diagnosis not present

## 2023-06-21 DIAGNOSIS — E785 Hyperlipidemia, unspecified: Secondary | ICD-10-CM | POA: Diagnosis not present

## 2023-06-21 DIAGNOSIS — Z515 Encounter for palliative care: Secondary | ICD-10-CM | POA: Diagnosis not present

## 2023-06-22 DIAGNOSIS — G20A1 Parkinson's disease without dyskinesia, without mention of fluctuations: Secondary | ICD-10-CM | POA: Diagnosis not present

## 2023-06-22 DIAGNOSIS — F419 Anxiety disorder, unspecified: Secondary | ICD-10-CM | POA: Diagnosis not present

## 2023-06-22 DIAGNOSIS — N183 Chronic kidney disease, stage 3 unspecified: Secondary | ICD-10-CM | POA: Diagnosis not present

## 2023-06-22 DIAGNOSIS — J45998 Other asthma: Secondary | ICD-10-CM | POA: Diagnosis not present

## 2023-06-22 DIAGNOSIS — J9601 Acute respiratory failure with hypoxia: Secondary | ICD-10-CM | POA: Diagnosis not present

## 2023-06-22 DIAGNOSIS — R52 Pain, unspecified: Secondary | ICD-10-CM | POA: Diagnosis not present

## 2023-06-22 DIAGNOSIS — R0600 Dyspnea, unspecified: Secondary | ICD-10-CM | POA: Diagnosis not present

## 2023-06-22 DIAGNOSIS — Z7189 Other specified counseling: Secondary | ICD-10-CM | POA: Diagnosis not present

## 2023-06-22 DIAGNOSIS — I10 Essential (primary) hypertension: Secondary | ICD-10-CM | POA: Diagnosis not present

## 2023-06-22 DIAGNOSIS — E785 Hyperlipidemia, unspecified: Secondary | ICD-10-CM | POA: Diagnosis not present

## 2023-06-22 DIAGNOSIS — Z515 Encounter for palliative care: Secondary | ICD-10-CM | POA: Diagnosis not present

## 2023-06-23 DIAGNOSIS — Z515 Encounter for palliative care: Secondary | ICD-10-CM | POA: Diagnosis not present

## 2023-07-18 DEATH — deceased

## 2024-03-01 IMAGING — MR MR HEAD W/O CM
10 series · 48 of 48 positions shown · non-contrast
Comparison: Da[facility]scan 05/25/2021. Report from head CT 10/30/1995
(images unavailable).

CLINICAL DATA: Provided history: Stroke, follow-up. Additional
history provided by scanning technologist: Pain, confusion,
difficulty walking, weakness. Patient's eye doctor has concern for
stroke.

EXAM:
MRI HEAD WITHOUT CONTRAST
TECHNIQUE: Multiplanar, multiecho pulse sequences of the brain and surrounding
structures were obtained without intravenous contrast.

[Series 2: DWI · axial · 3.0mm · 1.46mm/px · z∈[-52,+101]mm · 9 of 110 slices shown (1 of 4)]
[im 1/110]
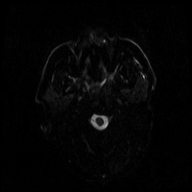
[im 14/110]
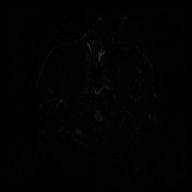
[im 28/110]
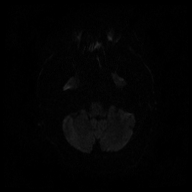
[im 41/110]
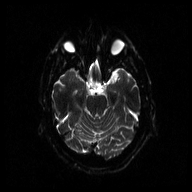
[im 55/110]
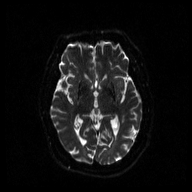
[im 69/110]
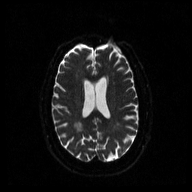
[im 82/110]
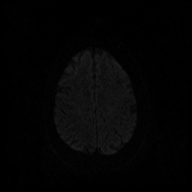
[im 96/110]
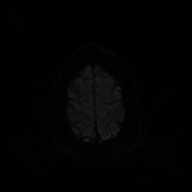
[im 110/110]
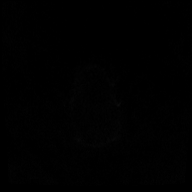

[Series 3: DWI · axial · 3.0mm · 1.46mm/px · z∈[-52,+101]mm · 4 of 55 slices shown (2 of 4)]
[im 1/55]
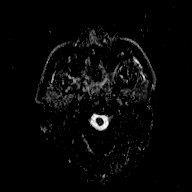
[im 19/55]
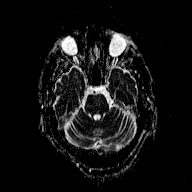
[im 37/55]
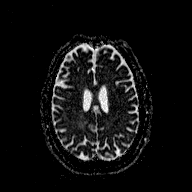
[im 55/55]
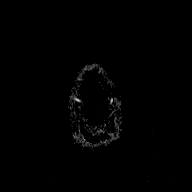

[Series 4: DWI · coronal · 5.0mm · 1.46mm/px · 6 of 72 slices shown (3 of 4)]
[im 1/72]
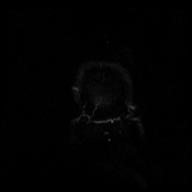
[im 15/72]
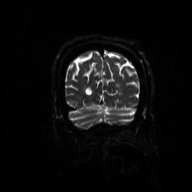
[im 29/72]
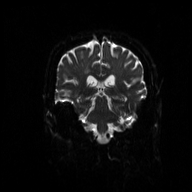
[im 43/72]
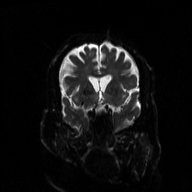
[im 57/72]
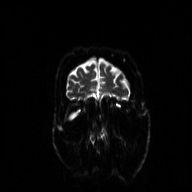
[im 72/72]
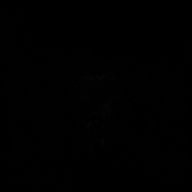

[Series 5: DWI · coronal · 5.0mm · 1.46mm/px · 3 of 36 slices shown (4 of 4)]
[im 1/36]
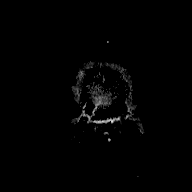
[im 18/36]
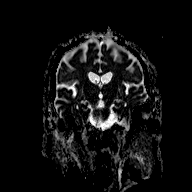
[im 36/36]
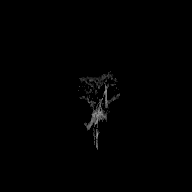

[Series 6: T1 · sagittal · 5.0mm · 0.53mm/px · 2 of 27 slices shown (1 of 2)]
[im 1/27]
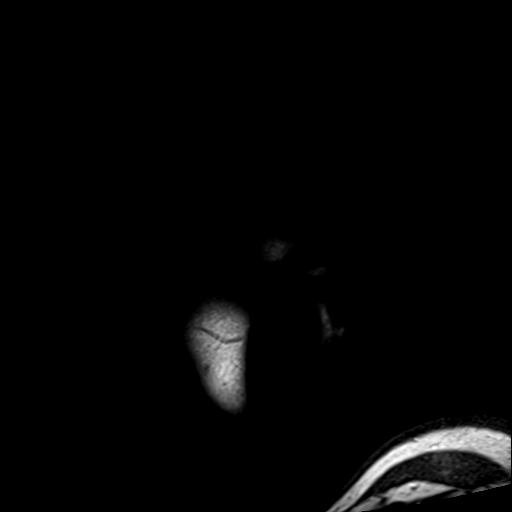
[im 27/27]
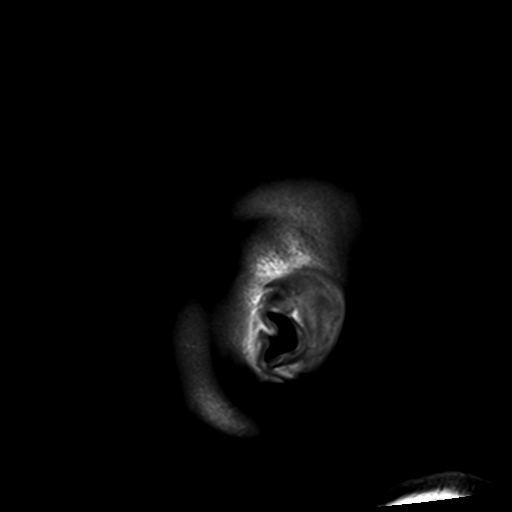

[Series 7: T2 · axial · 5.0mm · 0.81mm/px · z∈[-57,+101]mm · 2 of 25 slices shown (1 of 3)]
[im 1/25]
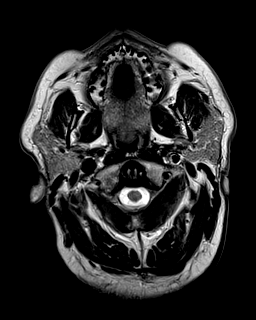
[im 25/25]
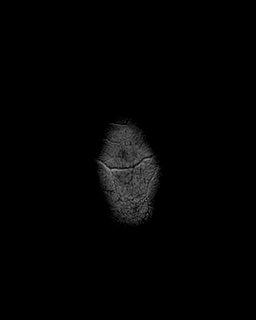

[Series 8: FLAIR · axial · 3.0mm · 0.51mm/px · z∈[-55,+97]mm · 4 of 55 slices shown]
[im 1/55]
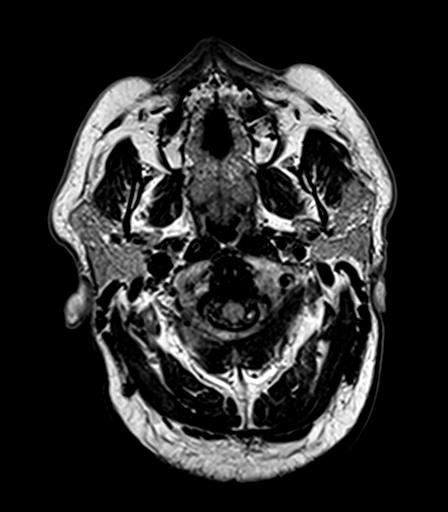
[im 19/55]
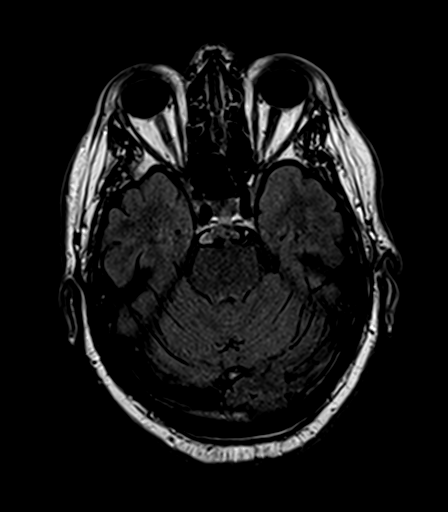
[im 37/55]
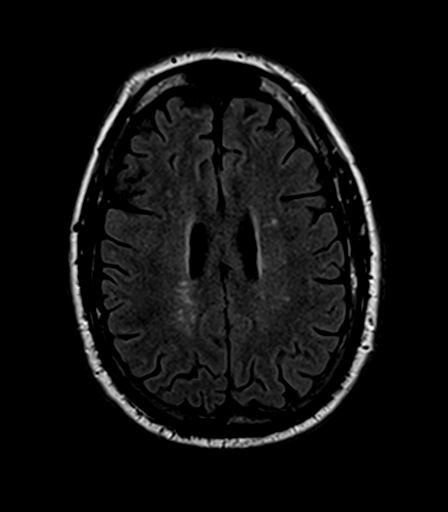
[im 55/55]
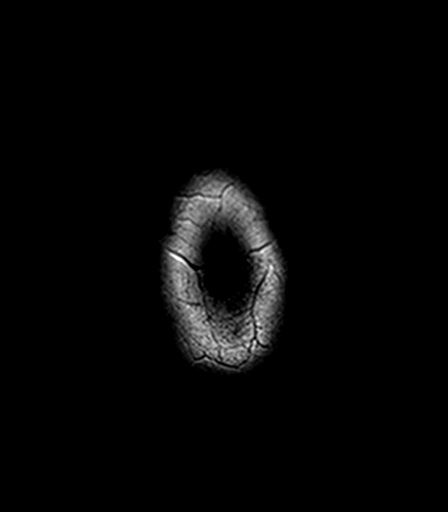

[Series 9: T2 · axial · 5.0mm · 0.81mm/px · z∈[-58,+100]mm · 2 of 25 slices shown (2 of 3)]
[im 1/25]
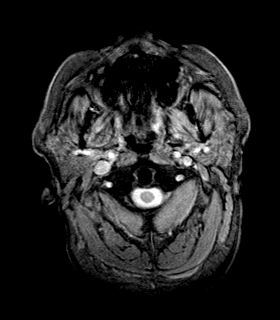
[im 25/25]
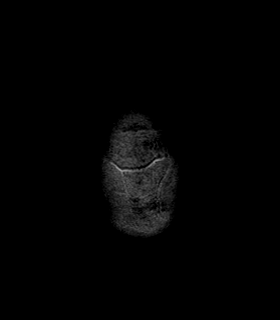

[Series 10: T1 · axial · 1.0mm · 1.02mm/px · z∈[-52,+97]mm · 13 of 160 slices shown (2 of 2)]
[im 1/160]
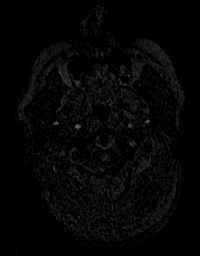
[im 14/160]
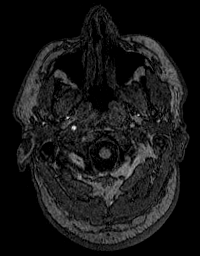
[im 27/160]
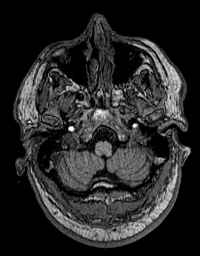
[im 40/160]
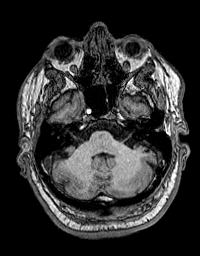
[im 54/160]
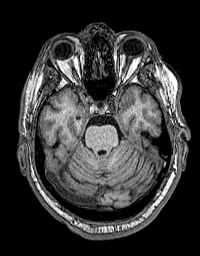
[im 67/160]
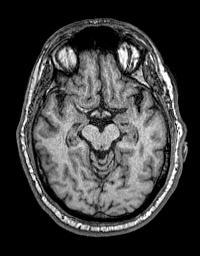
[im 80/160]
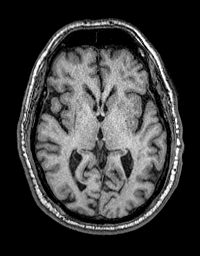
[im 93/160]
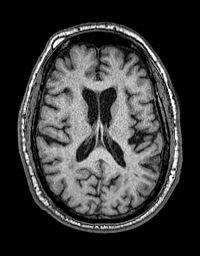
[im 107/160]
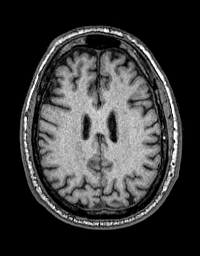
[im 120/160]
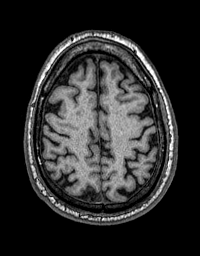
[im 133/160]
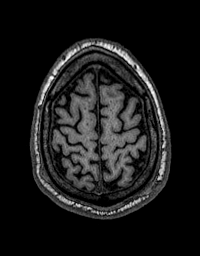
[im 146/160]
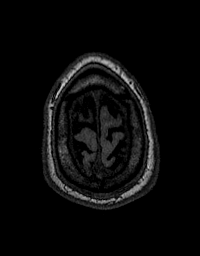
[im 160/160]
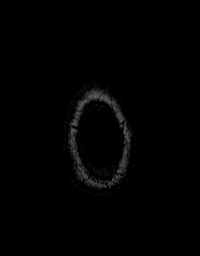

[Series 11: T2 · coronal · 5.0mm · 0.43mm/px · 3 of 34 slices shown (3 of 3)]
[im 1/34]
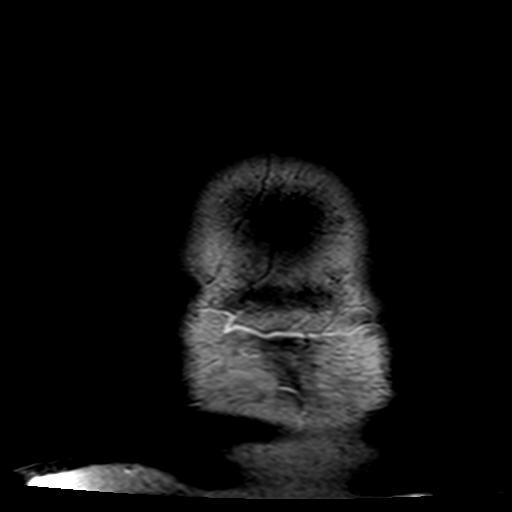
[im 17/34]
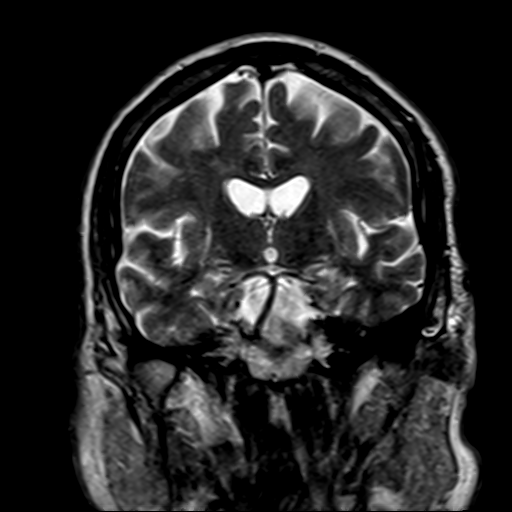
[im 34/34]
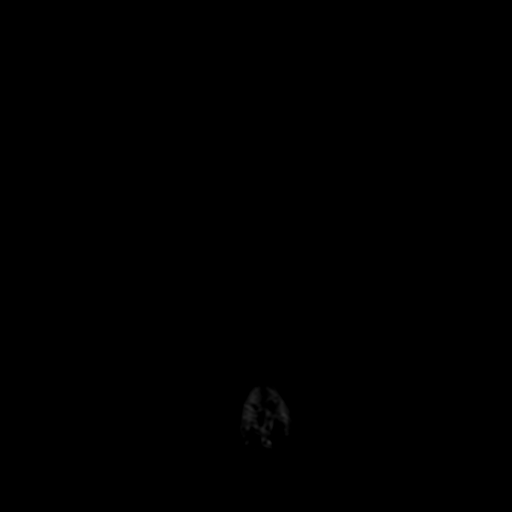

[48 of 48 positions shown; findings below may reference images not displayed]

FINDINGS: Mild intermittent motion degradation.

Brain:

No age advanced or lobar predominant parenchymal atrophy.

Mild-to-moderate multifocal T2 FLAIR hyperintense signal abnormality
within the cerebral white matter, nonspecific but compatible with
chronic small vessel ischemic disease.

There is no acute infarct.

No evidence of an intracranial mass.

No chronic intracranial blood products.

No extra-axial fluid collection.

No midline shift.

Vascular: Maintained flow voids within the proximal large arterial
vessels.

Skull and upper cervical spine: No focal suspicious marrow lesion.

Sinuses/Orbits: Visualized orbits show no acute finding. Prior
bilateral ocular lens replacement. Minimal mucosal thickening within
the inferior left maxillary sinus, and within the bilateral ethmoid
air cells.

Other: Small right forehead lipoma.
IMPRESSION: No evidence of acute intracranial abnormality.

Mild-to-moderate chronic small vessel ischemic changes within the
cerebral white matter.

No age advanced or lobar predominant parenchymal atrophy.

Minimal paranasal sinus disease, as described.

Small right forehead lipoma.
